# Patient Record
Sex: Female | Born: 1955
Health system: Southern US, Community
[De-identification: ages and names within clinical notes are randomized; demographics above are authoritative.]

## PROBLEM LIST (undated history)

## (undated) DIAGNOSIS — J40 Bronchitis, not specified as acute or chronic: Secondary | ICD-10-CM

## (undated) DIAGNOSIS — Z87442 Personal history of urinary calculi: Secondary | ICD-10-CM

## (undated) DIAGNOSIS — I10 Essential (primary) hypertension: Secondary | ICD-10-CM

## (undated) DIAGNOSIS — K219 Gastro-esophageal reflux disease without esophagitis: Secondary | ICD-10-CM

## (undated) DIAGNOSIS — S060X9A Concussion with loss of consciousness of unspecified duration, initial encounter: Secondary | ICD-10-CM

## (undated) DIAGNOSIS — E039 Hypothyroidism, unspecified: Secondary | ICD-10-CM

## (undated) DIAGNOSIS — M1990 Unspecified osteoarthritis, unspecified site: Secondary | ICD-10-CM

## (undated) DIAGNOSIS — S060XAA Concussion with loss of consciousness status unknown, initial encounter: Secondary | ICD-10-CM

## (undated) HISTORY — PX: LITHOTRIPSY: SUR834

## (undated) HISTORY — DX: Concussion with loss of consciousness status unknown, initial encounter: S06.0XAA

## (undated) HISTORY — PX: COLONOSCOPY: SHX174

## (undated) HISTORY — PX: FOOT SURGERY: SHX648

## (undated) HISTORY — DX: Concussion with loss of consciousness of unspecified duration, initial encounter: S06.0X9A

---

## 1996-05-08 HISTORY — PX: DILATION AND CURETTAGE OF UTERUS: SHX78

## 2000-12-12 ENCOUNTER — Other Ambulatory Visit: Admission: RE | Admit: 2000-12-12 | Discharge: 2000-12-12 | Payer: Self-pay | Admitting: Family Medicine

## 2004-08-01 ENCOUNTER — Ambulatory Visit: Payer: Self-pay | Admitting: Family Medicine

## 2005-10-02 ENCOUNTER — Ambulatory Visit: Payer: Self-pay

## 2006-10-29 ENCOUNTER — Ambulatory Visit: Payer: Self-pay

## 2007-10-31 ENCOUNTER — Ambulatory Visit: Payer: Self-pay | Admitting: Family Medicine

## 2007-11-07 ENCOUNTER — Ambulatory Visit: Payer: Self-pay | Admitting: Family Medicine

## 2008-05-20 ENCOUNTER — Ambulatory Visit: Payer: Self-pay | Admitting: Family Medicine

## 2008-09-16 ENCOUNTER — Ambulatory Visit: Payer: Self-pay

## 2008-09-27 ENCOUNTER — Emergency Department: Payer: Self-pay | Admitting: Emergency Medicine

## 2008-10-01 ENCOUNTER — Ambulatory Visit: Payer: Self-pay | Admitting: Urology

## 2008-10-12 ENCOUNTER — Ambulatory Visit: Payer: Self-pay | Admitting: Urology

## 2008-10-26 ENCOUNTER — Ambulatory Visit: Payer: Self-pay | Admitting: Urology

## 2008-10-27 ENCOUNTER — Ambulatory Visit: Payer: Self-pay | Admitting: Urology

## 2008-10-29 ENCOUNTER — Ambulatory Visit: Payer: Self-pay | Admitting: Urology

## 2008-11-16 ENCOUNTER — Ambulatory Visit: Payer: Self-pay | Admitting: Urology

## 2009-02-11 ENCOUNTER — Ambulatory Visit: Payer: Self-pay

## 2010-03-04 ENCOUNTER — Ambulatory Visit: Payer: Self-pay | Admitting: Family Medicine

## 2011-04-06 ENCOUNTER — Ambulatory Visit: Payer: Self-pay | Admitting: Family Medicine

## 2011-10-25 ENCOUNTER — Ambulatory Visit: Payer: Self-pay | Admitting: Family Medicine

## 2012-04-01 LAB — HM HEPATITIS C SCREENING LAB: HM HEPATITIS C SCREENING: NEGATIVE

## 2012-04-19 ENCOUNTER — Ambulatory Visit: Payer: Self-pay | Admitting: Family Medicine

## 2013-05-20 ENCOUNTER — Ambulatory Visit: Payer: Self-pay | Admitting: Family Medicine

## 2014-06-04 ENCOUNTER — Ambulatory Visit: Payer: Self-pay | Admitting: Family Medicine

## 2014-07-13 ENCOUNTER — Ambulatory Visit: Payer: Self-pay | Admitting: Family Medicine

## 2014-10-09 ENCOUNTER — Telehealth: Payer: Self-pay | Admitting: Family Medicine

## 2014-10-09 NOTE — Telephone Encounter (Signed)
Note written and placed at the front desk file for pick up. Please notify patient.

## 2014-10-09 NOTE — Telephone Encounter (Signed)
PT would like to pick up a letter for work stating she can wear tennis shoes to work. Pt stated that she has to get this letter every year. Thanks TNP

## 2014-10-12 NOTE — Telephone Encounter (Signed)
Patient advised work note is at front desk ready for pick up. NW

## 2014-11-03 ENCOUNTER — Other Ambulatory Visit: Payer: Self-pay | Admitting: Family Medicine

## 2014-11-14 ENCOUNTER — Other Ambulatory Visit: Payer: Self-pay | Admitting: Family Medicine

## 2014-11-22 ENCOUNTER — Other Ambulatory Visit: Payer: Self-pay | Admitting: Family Medicine

## 2014-11-23 ENCOUNTER — Other Ambulatory Visit: Payer: Self-pay | Admitting: Family Medicine

## 2014-12-01 ENCOUNTER — Encounter: Payer: Self-pay | Admitting: Family Medicine

## 2014-12-01 ENCOUNTER — Ambulatory Visit (INDEPENDENT_AMBULATORY_CARE_PROVIDER_SITE_OTHER): Payer: 59 | Admitting: Family Medicine

## 2014-12-01 VITALS — BP 116/78 | HR 68 | Temp 97.8°F | Resp 16 | Wt 153.4 lb

## 2014-12-01 DIAGNOSIS — Z975 Presence of (intrauterine) contraceptive device: Secondary | ICD-10-CM | POA: Insufficient documentation

## 2014-12-01 DIAGNOSIS — G43909 Migraine, unspecified, not intractable, without status migrainosus: Secondary | ICD-10-CM | POA: Insufficient documentation

## 2014-12-01 DIAGNOSIS — K5730 Diverticulosis of large intestine without perforation or abscess without bleeding: Secondary | ICD-10-CM | POA: Insufficient documentation

## 2014-12-01 DIAGNOSIS — R768 Other specified abnormal immunological findings in serum: Secondary | ICD-10-CM | POA: Insufficient documentation

## 2014-12-01 DIAGNOSIS — I10 Essential (primary) hypertension: Secondary | ICD-10-CM | POA: Insufficient documentation

## 2014-12-01 DIAGNOSIS — K589 Irritable bowel syndrome without diarrhea: Secondary | ICD-10-CM | POA: Insufficient documentation

## 2014-12-01 DIAGNOSIS — E559 Vitamin D deficiency, unspecified: Secondary | ICD-10-CM | POA: Insufficient documentation

## 2014-12-01 DIAGNOSIS — F411 Generalized anxiety disorder: Secondary | ICD-10-CM | POA: Insufficient documentation

## 2014-12-01 DIAGNOSIS — Z124 Encounter for screening for malignant neoplasm of cervix: Secondary | ICD-10-CM | POA: Diagnosis not present

## 2014-12-01 DIAGNOSIS — L409 Psoriasis, unspecified: Secondary | ICD-10-CM

## 2014-12-01 DIAGNOSIS — K219 Gastro-esophageal reflux disease without esophagitis: Secondary | ICD-10-CM | POA: Insufficient documentation

## 2014-12-01 DIAGNOSIS — F419 Anxiety disorder, unspecified: Secondary | ICD-10-CM | POA: Insufficient documentation

## 2014-12-01 DIAGNOSIS — N841 Polyp of cervix uteri: Secondary | ICD-10-CM | POA: Insufficient documentation

## 2014-12-01 NOTE — Progress Notes (Signed)
   Subjective:    Patient ID: Gina [name] Labella, female    DOB: 1955/08/19, 59 y.o.   MRN: [id number] Chief Complaint  Patient presents with  . Gynecologic Exam    HPI  This 59 year old female had normal exam 04-20-14 but PAP by thin prep reported lubricant obscuring specimen and unable to complete test. This is a repeat test only today. Asymptomatic with out significant concerns. Psoriasis showing a slight flare today on back and buttocks only.  History reviewed. No pertinent past medical history. Patient Active Problem List   Diagnosis Date Noted  . Anxiety 12/01/2014  . Mucous polyp of cervix 12/01/2014  . Diverticulosis of colon 12/01/2014  . Acid reflux 12/01/2014  . HCV antibody positive 12/01/2014  . BP (high blood pressure) 12/01/2014  . Irritable colon 12/01/2014  . Presence of intrauterine contraceptive device 12/01/2014  . Migraine 12/01/2014  . Psoriasis 12/01/2014  . Avitaminosis D 12/01/2014   Past Surgical History  Procedure Laterality Date  . Foot surgery      08/2007  . Dilation and curettage of uterus  1998   History  Substance Use Topics  . Smoking status: Never Smoker   . Smokeless tobacco: Never Used  . Alcohol Use: No   Family History  Problem Relation Age of Onset  . COPD Mother   . Emphysema Mother   . Arthritis Mother   . Hypertension Father   . Heart disease Father   . Coronary artery disease Father   . Arthritis Sister   . Throat cancer Brother   . Heart attack Maternal Grandfather   . Alzheimer's disease Paternal Grandfather       Review of Systems  Genitourinary: Negative.   Skin: Positive for rash.      Objective:   Physical Exam  Constitutional: She is oriented to person, place, and time. She appears well-developed and well-nourished. No distress.  HENT:  Head: Normocephalic and atraumatic.  Right Ear: Hearing normal.  Left Ear: Hearing normal.  Nose: Nose normal.  Eyes: Conjunctivae and lids are normal. Right eye  exhibits no discharge. Left eye exhibits no discharge. No scleral icterus.  Pulmonary/Chest: Effort normal. No respiratory distress.  Genitourinary: Vagina normal. There is no rash or tenderness on the right labia. There is no rash or tenderness on the left labia. Cervix exhibits no discharge. Right adnexum displays no mass, no tenderness and no fullness. Left adnexum displays no mass, no tenderness and no fullness.    Musculoskeletal: Normal range of motion.  Neurological: She is alert and oriented to person, place, and time.  Skin: Skin is intact. Rash noted. No lesion noted.     Psychiatric: She has a normal mood and affect. Her speech is normal and behavior is normal. Thought content normal.      Assessment & Plan:  1. Pap smear for cervical cancer screening Denies discharge, irritation or pain vaginally. No abdominal or pelvic pain. LMP 2001. PAP smear 04-20-14 was obscured by lubricant and unable to be reported. This is a repeat test only. 2. Psoriasis Large patches with thick silvery scale on mid back and upper buttock cleft. Usually flares in the winter and controlled with Triamcinolone cream. Recheck at [facility] in December 2016.

## 2014-12-03 ENCOUNTER — Telehealth: Payer: Self-pay

## 2014-12-03 LAB — PAP IG W/ RFLX HPV ASCU: PAP SMEAR COMMENT: 0

## 2014-12-03 NOTE — Telephone Encounter (Signed)
LMTCB 12/03/2014  Thanks,   -Mickel Baas

## 2014-12-03 NOTE — Telephone Encounter (Signed)
-----   Message from Margo Common, [place] sent at 12/03/2014  1:57 PM EDT ----- Normal PAP smear.

## 2014-12-04 NOTE — Telephone Encounter (Signed)
Pt advised.

## 2014-12-11 ENCOUNTER — Other Ambulatory Visit: Payer: Self-pay | Admitting: Family Medicine

## 2014-12-11 DIAGNOSIS — F419 Anxiety disorder, unspecified: Secondary | ICD-10-CM

## 2014-12-11 DIAGNOSIS — F5105 Insomnia due to other mental disorder: Principal | ICD-10-CM

## 2014-12-15 ENCOUNTER — Other Ambulatory Visit: Payer: Self-pay | Admitting: Family Medicine

## 2014-12-15 NOTE — Telephone Encounter (Signed)
ALPRAZolam (XANAX) 0.5 MG tablet 12/11/14 -- Vickki Muff Chrismon, PA TAKE 1 TABLET BY MOUTH DURING DAY AND 1 TAB AT BEDTIME Notes: Not to exceed 5 additional fills before 05/04/2015.  [facility] uses [facility].  Thanks Con Memos

## 2014-12-16 NOTE — Telephone Encounter (Signed)
Pt still waiting on her generic xanax.    Thanks [employer]

## 2014-12-17 NOTE — Telephone Encounter (Signed)
Left message to call back  

## 2014-12-18 NOTE — Telephone Encounter (Signed)
LMTCB-12/18/14  Thanks,  -Joseline

## 2014-12-18 NOTE — Telephone Encounter (Signed)
Advised pt as directed below, pt stated that her prescription was called to the pharmacy and that she has already picked it up from the pharmacy.  Thanks,

## 2015-02-18 ENCOUNTER — Telehealth: Payer: Self-pay | Admitting: Family Medicine

## 2015-02-18 ENCOUNTER — Other Ambulatory Visit: Payer: Self-pay | Admitting: Family Medicine

## 2015-02-18 DIAGNOSIS — F419 Anxiety disorder, unspecified: Secondary | ICD-10-CM

## 2015-02-18 NOTE — Telephone Encounter (Signed)
Pt contacted office for refill request on the following medications:  ALPRAZolam (XANAX) 0.5 MG.  [facility].  CB#[phone]/MW

## 2015-02-22 ENCOUNTER — Other Ambulatory Visit: Payer: Self-pay

## 2015-02-22 NOTE — Telephone Encounter (Signed)
[facility] Alp[facility]am 0.5 mg tablets.

## 2015-02-23 ENCOUNTER — Other Ambulatory Visit: Payer: Self-pay | Admitting: Family Medicine

## 2015-02-23 NOTE — Telephone Encounter (Signed)
Pt contacted office for refill request on the following medications: ALPRAZolam (XANAX) 0.5 MG tablet to [facility]. Pt would like this sent today if possible b/c she has been trying to get this filled since 02/18/15. Thanks TNP

## 2015-02-23 NOTE — Telephone Encounter (Signed)
Completed form today and will [facility] [place].

## 2015-02-24 NOTE — Telephone Encounter (Signed)
Patient advised [facility] has [facility]

## 2015-05-06 ENCOUNTER — Other Ambulatory Visit: Payer: Self-pay | Admitting: Family Medicine

## 2015-05-07 ENCOUNTER — Telehealth: Payer: Self-pay | Admitting: Family Medicine

## 2015-05-07 NOTE — Telephone Encounter (Signed)
[facility]  

## 2015-05-07 NOTE — Telephone Encounter (Signed)
Gina [name] patient 

## 2015-05-13 ENCOUNTER — Encounter: Payer: Self-pay | Admitting: Physician Assistant

## 2015-05-13 ENCOUNTER — Ambulatory Visit (INDEPENDENT_AMBULATORY_CARE_PROVIDER_SITE_OTHER): Payer: 59 | Admitting: Physician Assistant

## 2015-05-13 VITALS — BP 110/70 | HR 79 | Temp 97.8°F | Resp 16 | Ht 63.0 in | Wt 156.6 lb

## 2015-05-13 DIAGNOSIS — B354 Tinea corporis: Secondary | ICD-10-CM

## 2015-05-13 DIAGNOSIS — J302 Other seasonal allergic rhinitis: Secondary | ICD-10-CM

## 2015-05-13 DIAGNOSIS — Z0000 Encounter for general adult medical examination without abnormal findings: Secondary | ICD-10-CM | POA: Diagnosis not present

## 2015-05-13 DIAGNOSIS — Z1239 Encounter for other screening for malignant neoplasm of breast: Secondary | ICD-10-CM

## 2015-05-13 DIAGNOSIS — J0140 Acute pansinusitis, unspecified: Secondary | ICD-10-CM

## 2015-05-13 DIAGNOSIS — B49 Unspecified mycosis: Secondary | ICD-10-CM

## 2015-05-13 LAB — POCT URINALYSIS DIPSTICK
BILIRUBIN UA: NEGATIVE
Blood, UA: NEGATIVE
Glucose, UA: NEGATIVE
Ketones, UA: NEGATIVE
LEUKOCYTES UA: NEGATIVE
Nitrite, UA: NEGATIVE
Protein, UA: NEGATIVE
Spec Grav, UA: 1.01
Urobilinogen, UA: 1
pH, UA: 6

## 2015-05-13 MED ORDER — AZITHROMYCIN 250 MG PO TABS: ORAL_TABLET | ORAL | Status: DC

## 2015-05-13 MED ORDER — FLUTICASONE PROPIONATE 50 MCG/ACT NA SUSP: 2.0000 | Freq: Every day | NASAL | Status: DC

## 2015-05-13 MED ORDER — CLOTRIMAZOLE-BETAMETHASONE 1-0.05 % EX CREA: 1.0000 "application " | TOPICAL_CREAM | Freq: Two times a day (BID) | CUTANEOUS | Status: DC

## 2015-05-13 NOTE — Patient Instructions (Signed)

## 2015-05-13 NOTE — Progress Notes (Signed)
Patient: Gina [name], Female    DOB: 1955/07/13, 60 y.o.   MRN: [id number] Visit Date: 05/13/2015  Today's Provider: Mar Daring, PA-C   Chief Complaint  Patient presents with  . Annual Exam   Subjective:    Annual physical exam Gina [name] is a 60 y.o. female who presents today for health maintenance and complete physical. She feels well, patient reports her lower back hurts for the past days, but she has been very active at work. Patient doesn't have any hematuria or dysuria. She reports exercising--once a week walks. She reports she is sleeping well.  Tdap:01/20/2009 Mammo:06/04/14 NL Colonoscopy:05/10/2013 Diverticulosis Internal Hemorrhoids-repeat in 10 years Pap:12/01/14 NL BMD:07/13/14 EKG:03/26/2012  -----------------------------------------------------------------   Review of Systems  Constitutional: Negative.   HENT: Positive for ear pain and sinus pressure.   Eyes: Negative.   Respiratory: Negative.   Cardiovascular: Negative.   Gastrointestinal: Negative.   Endocrine: Negative.   Genitourinary: Negative.   Musculoskeletal: Positive for back pain.  Skin: Positive for rash.  Allergic/Immunologic: Negative.   Neurological: Negative.   Hematological: Negative.   Psychiatric/Behavioral: Negative.     Social History      She  reports that she has never smoked. She has never used smokeless tobacco. She reports that she does not drink alcohol or use illicit drugs.       Social History   Social History  . Marital Status: Married    Spouse Name: N/A  . Number of Children: N/A  . Years of Education: N/A   Social History Main Topics  . Smoking status: Never Smoker   . Smokeless tobacco: Never Used  . Alcohol Use: No  . Drug Use: No  . Sexual Activity: Not Asked   Other Topics Concern  . None   Social History Narrative    History reviewed. No pertinent past medical history.   Patient Active Problem List   Diagnosis Date  Noted  . Anxiety 12/01/2014  . Mucous polyp of cervix 12/01/2014  . Diverticulosis of colon 12/01/2014  . Acid reflux 12/01/2014  . HCV antibody positive 12/01/2014  . BP (high blood pressure) 12/01/2014  . Irritable colon 12/01/2014  . Presence of intrauterine contraceptive device 12/01/2014  . Migraine 12/01/2014  . Psoriasis 12/01/2014  . Avitaminosis D 12/01/2014    Past Surgical History  Procedure Laterality Date  . Foot surgery      08/2007  . Dilation and curettage of uterus  1998    Family History        Family Status  Relation Status Death Age  . Mother Deceased   . Father Deceased   . Sister Alive   . Brother Deceased   . Maternal Grandmother Deceased   . Maternal Grandfather Deceased   . Paternal Grandmother Deceased   . Paternal Grandfather Deceased         Her family history includes Alzheimer's disease in her paternal grandfather; Arthritis in her mother and sister; COPD in her mother; Coronary artery disease in her father; Emphysema in her mother; Heart attack in her maternal grandfather; Heart disease in her father; Hypertension in her father; Throat cancer in her brother.    No Known Allergies  Previous Medications   ALPRAZOLAM (XANAX) 0.5 MG TABLET    TAKE 1 TABLET BY MOUTH DURING DAY AND 1 TABLET AT BEDTIME   AMLODIPINE (NORVASC) 5 MG TABLET    TAKE 1 TABLET BY MOUTH EVERY DAY   B COMPLEX VITAMINS  PO    Take 1 tablet by mouth daily.   CETIRIZINE (ZYRTEC ALLERGY) 10 MG TABLET    Take 1 tablet by mouth daily.   CHOLECALCIFEROL (VITAMIN D3) 1000 UNITS CAPS    Take 1 capsule by mouth daily.   CLOTRIMAZOLE-BETAMETHASONE (LOTRISONE) CREAM    1 application 2 (two) times daily.   FLUTICASONE (FLONASE) 50 MCG/ACT NASAL SPRAY    Place 2 sprays into the nose daily.   LANSOPRAZOLE (PREVACID) 15 MG CAPSULE    Take 1 capsule by mouth daily.   MONTELUKAST (SINGULAIR) 10 MG TABLET    TAKE 1 TABLET BY MOUTH EVERY DAY   POLYCARBOPHIL (FIBERCON) 625 MG TABLET    Take 1  tablet by mouth daily.    Patient Care Team: Margo Common, PA as PCP - General (Physician Assistant)     Objective:   Vitals: BP 110/70 mmHg  Pulse 79  Temp(Src) 97.8 F (36.6 C) (Oral)  Resp 16  Ht 5\' 3"  (1.6 m)  Wt 156 lb 9.6 oz (71.033 kg)  BMI 27.75 kg/m2   Physical Exam  Constitutional: She is oriented to person, place, and time. She appears well-developed and well-nourished. No distress.  HENT:  Head: Normocephalic and atraumatic.  Right Ear: External ear normal.  Left Ear: External ear normal.  Nose: Nose normal.  Mouth/Throat: Oropharynx is clear and moist. No oropharyngeal exudate.  Eyes: Conjunctivae and EOM are normal. Pupils are equal, round, and reactive to light. Right eye exhibits no discharge. Left eye exhibits no discharge. No scleral icterus.  Neck: Normal range of motion. Neck supple. No JVD present. No tracheal deviation present. No thyromegaly present.  Cardiovascular: Normal rate, regular rhythm, normal heart sounds and intact distal pulses.  Exam reveals no gallop and no friction rub.   No murmur heard. Pulmonary/Chest: Effort normal and breath sounds normal. No respiratory distress. She has no wheezes. She has no rales. She exhibits no tenderness. Right breast exhibits no inverted nipple, no mass, no nipple discharge, no skin change and no tenderness. Left breast exhibits no inverted nipple, no mass, no nipple discharge, no skin change and no tenderness. Breasts are symmetrical.  Abdominal: Soft. Bowel sounds are normal. She exhibits no distension and no mass. There is no tenderness. There is no rebound and no guarding.  Musculoskeletal: Normal range of motion. She exhibits no edema or tenderness.  Lymphadenopathy:    She has no cervical adenopathy.  Neurological: She is alert and oriented to person, place, and time.  Skin: Skin is warm and dry. No rash noted. She is not diaphoretic.  Psychiatric: She has a normal mood and affect. Her behavior is  normal. Judgment and thought content normal.  Vitals reviewed.    Depression Screen No flowsheet data found.    Assessment & Plan:     Routine Health Maintenance and Physical Exam  1. Annual physical exam Physical exam today was normal. I will check labs as below. I will follow-up with her pending lab results. UA today in the office was negative for infection. If labs are stable and within normal limits she would not need to have these repeated for one year and can be done at her next annual physical exam. She is to call the office in the meantime if she has any acute issues, questions or concerns. - POCT urinalysis dipstick - CBC with Differential/Platelet - Comprehensive metabolic panel - Lipid panel - TSH - Hemoglobin A1c  2. Breast cancer screening Breast exam today in the  office was normal. Mammogram was ordered as below. Information for [facility] breast clinic was given to patient so that she may call and schedule this appointment at her convenience. - MM DIGITAL SCREENING BILATERAL; Future  3. Other seasonal allergic rhinitis Currently stable with Flonase. Continue current medical treatment plan. Flonase was pulled as below for refill. - fluticasone (FLONASE) 50 MCG/ACT nasal spray; Place 2 sprays into both nostrils daily.  Dispense: 16 g; Refill: 11  4. Fungal infection of the body She does have a fungal infections and that is recurrent under her breast tissue. She feels this is most likely secondary to her still having night sweats. She has been using Lotrisone cream which does help to keep the infection under control. Medication was pulled for refill. - clotrimazole-betamethasone (LOTRISONE) cream; Apply 1 application topically 2 (two) times daily.  Dispense: 45 g; Refill: 1  5. Acute pansinusitis, recurrence not specified She has had consistent sinus infection over the last 3 weeks without resolution with over-the-counter treatments. I will give her a Z-Pak as below.  She is to call the office if symptoms fail to improve or worsen. - azithromycin (ZITHROMAX) 250 MG tablet; Take 2 tablets PO on day one, and one tablet PO daily thereafter until completed.  Dispense: 6 tablet; Refill: 0   Exercise Activities and Dietary recommendations Goals    None      Immunization History  Administered Date(s) Administered  . Tdap 01/20/2009    Health Maintenance  Topic Date Due  . Hepatitis C Screening  1956-01-04  . HIV Screening  10/01/1972  . MAMMOGRAM  10/02/2007  . COLONOSCOPY  10/02/2007  . INFLUENZA VACCINE  12/07/2014  . PAP SMEAR  11/30/2017  . TETANUS/TDAP  01/21/2019      Discussed health benefits of physical activity, and encouraged her to engage in regular exercise appropriate for her age and condition.    --------------------------------------------------------------------

## 2015-05-18 ENCOUNTER — Other Ambulatory Visit: Payer: Self-pay | Admitting: Family Medicine

## 2015-05-21 NOTE — Telephone Encounter (Signed)
Pt called back to let us know [facility] for amLODipine (NORVASC) 5 MG tablet. Pt stated that she thought our office had told her we sent the RX but maybe she miss understood. Pt stated she is about out of the medication and would like it to be sent today if possible. Thanks TNP

## 2015-05-21 NOTE — Telephone Encounter (Signed)
Pt is calling for refill amLODipine (NORVASC) 5 MG tablet Taking 11/16/14 -- Vickki Muff Chrismon, PA TAKE 1 TABLET BY MOUTH EVERY DAY   [facility] [place], [employer]

## 2015-05-22 LAB — CBC WITH DIFFERENTIAL/PLATELET
BASOS: 1 %
Basophils Absolute: 0.1 10*3/uL (ref 0.0–0.2)
EOS (ABSOLUTE): 0.2 10*3/uL (ref 0.0–0.4)
Eos: 6 %
Hematocrit: 40.2 % (ref 34.0–46.6)
Hemoglobin: 13.1 g/dL (ref 11.1–15.9)
IMMATURE GRANS (ABS): 0 10*3/uL (ref 0.0–0.1)
Immature Granulocytes: 0 %
LYMPHS: 29 %
Lymphocytes Absolute: 1.2 10*3/uL (ref 0.7–3.1)
MCH: 31.1 pg (ref 26.6–33.0)
MCHC: 32.6 g/dL (ref 31.5–35.7)
MCV: 96 fL (ref 79–97)
Monocytes Absolute: 0.4 10*3/uL (ref 0.1–0.9)
Monocytes: 8 %
Neutrophils Absolute: 2.4 10*3/uL (ref 1.4–7.0)
Neutrophils: 56 %
Platelets: 267 10*3/uL (ref 150–379)
RBC: 4.21 x10E6/uL (ref 3.77–5.28)
RDW: 14.2 % (ref 12.3–15.4)
WBC: 4.3 10*3/uL (ref 3.4–10.8)

## 2015-05-22 LAB — COMPREHENSIVE METABOLIC PANEL
ALT: 15 IU/L (ref 0–32)
AST: 17 IU/L (ref 0–40)
Albumin/Globulin Ratio: 1.4 (ref 1.1–2.5)
Albumin: 4 g/dL (ref 3.5–5.5)
Alkaline Phosphatase: 73 IU/L (ref 39–117)
BILIRUBIN TOTAL: 0.4 mg/dL (ref 0.0–1.2)
BUN/Creatinine Ratio: 16 (ref 9–23)
BUN: 11 mg/dL (ref 6–24)
CHLORIDE: 106 mmol/L (ref 96–106)
CO2: 26 mmol/L (ref 18–29)
Calcium: 9.4 mg/dL (ref 8.7–10.2)
Creatinine, Ser: 0.69 mg/dL (ref 0.57–1.00)
GFR calc Af Amer: 112 mL/min/{1.73_m2} (ref >59)
GFR calc non Af Amer: 97 mL/min/{1.73_m2} (ref >59)
Globulin, Total: 2.8 g/dL (ref 1.5–4.5)
Glucose: 83 mg/dL (ref 65–99)
Potassium: 4.4 mmol/L (ref 3.5–5.2)
Sodium: 145 mmol/L — ABNORMAL HIGH (ref 134–144)
TOTAL PROTEIN: 6.8 g/dL (ref 6.0–8.5)

## 2015-05-22 LAB — LIPID PANEL
Chol/HDL Ratio: 3 ratio units (ref 0.0–4.4)
Cholesterol, Total: 187 mg/dL (ref 100–199)
HDL: 63 mg/dL (ref >39)
LDL Calculated: 107 mg/dL — ABNORMAL HIGH (ref 0–99)
Triglycerides: 83 mg/dL (ref 0–149)
VLDL Cholesterol Cal: 17 mg/dL (ref 5–40)

## 2015-05-22 LAB — HEMOGLOBIN A1C
ESTIMATED AVERAGE GLUCOSE: 108 mg/dL
Hgb A1c MFr Bld: 5.4 % (ref 4.8–5.6)

## 2015-05-22 LAB — TSH: TSH: 3.49 u[IU]/mL (ref 0.450–4.500)

## 2015-05-24 ENCOUNTER — Telehealth: Payer: Self-pay

## 2015-05-24 NOTE — Telephone Encounter (Signed)
Patient advised as directed below. Patient verbalized understanding.  

## 2015-05-24 NOTE — Telephone Encounter (Signed)
-----   Message from Mar Daring, PA-C sent at 05/22/2015  3:39 PM EST ----- All labs are within normal limits and stable.  We will recheck in one year. Thanks! -JB

## 2015-06-10 ENCOUNTER — Other Ambulatory Visit: Payer: Self-pay | Admitting: Physician Assistant

## 2015-06-10 DIAGNOSIS — F411 Generalized anxiety disorder: Secondary | ICD-10-CM

## 2015-06-10 NOTE — Telephone Encounter (Signed)
[facility] [place] [facility] Alp[facility]am 0.5mg  as prescribed. Patient advised.  Thanks,  -Joseline

## 2015-06-24 ENCOUNTER — Ambulatory Visit
Admission: RE | Admit: 2015-06-24 | Discharge: 2015-06-24 | Disposition: A | Discharge status: Home / Self Care | Payer: 59 | Source: Ambulatory Visit | Attending: Physician Assistant | Admitting: Physician Assistant

## 2015-06-24 DIAGNOSIS — Z1231 Encounter for screening mammogram for malignant neoplasm of breast: Secondary | ICD-10-CM | POA: Insufficient documentation

## 2015-06-24 DIAGNOSIS — Z1239 Encounter for other screening for malignant neoplasm of breast: Secondary | ICD-10-CM

## 2015-06-25 ENCOUNTER — Telehealth: Payer: Self-pay

## 2015-06-25 NOTE — Telephone Encounter (Signed)
LMTCB  Thanks,  -Tiaja Hagan 

## 2015-06-25 NOTE — Telephone Encounter (Signed)
-----   Message from Mar Daring, PA-C sent at 06/25/2015  8:15 AM EST ----- Normal mammogram. Repeat screening in one year.

## 2015-06-28 ENCOUNTER — Encounter: Payer: Self-pay | Admitting: Physician Assistant

## 2015-06-28 ENCOUNTER — Other Ambulatory Visit: Payer: Self-pay | Admitting: Physician Assistant

## 2015-06-28 NOTE — Telephone Encounter (Signed)
Pt is returning call.  CB#[phone]/MW

## 2015-06-28 NOTE — Telephone Encounter (Signed)
Patient advised.  Thanks,  -Gina [name] 

## 2015-06-28 NOTE — Telephone Encounter (Signed)
Letter printed.

## 2015-06-28 NOTE — Telephone Encounter (Signed)
Patient advised as directed below. Patient is requesting a note for her to wear tennis shoes at work she said she usually gets this note just to have it on file at work.  Thanks,  Gina [name]

## 2015-07-21 ENCOUNTER — Other Ambulatory Visit: Payer: Self-pay | Admitting: Physician Assistant

## 2015-07-21 DIAGNOSIS — F411 Generalized anxiety disorder: Secondary | ICD-10-CM

## 2015-07-22 NOTE — Telephone Encounter (Signed)
[facility]  

## 2015-08-13 ENCOUNTER — Ambulatory Visit (INDEPENDENT_AMBULATORY_CARE_PROVIDER_SITE_OTHER): Payer: 59 | Admitting: Physician Assistant

## 2015-08-13 ENCOUNTER — Encounter: Payer: Self-pay | Admitting: Physician Assistant

## 2015-08-13 VITALS — BP 118/80 | HR 72 | Temp 98.0°F | Resp 16 | Wt 153.0 lb

## 2015-08-13 DIAGNOSIS — L409 Psoriasis, unspecified: Secondary | ICD-10-CM | POA: Diagnosis not present

## 2015-08-13 DIAGNOSIS — L0293 Carbuncle, unspecified: Secondary | ICD-10-CM

## 2015-08-13 MED ORDER — TRIAMCINOLONE ACETONIDE 0.1 % EX CREA: 1.0000 "application " | TOPICAL_CREAM | Freq: Two times a day (BID) | CUTANEOUS | Status: DC

## 2015-08-13 MED ORDER — DOXYCYCLINE HYCLATE 100 MG PO TABS: 100.0000 mg | ORAL_TABLET | Freq: Two times a day (BID) | ORAL | Status: DC

## 2015-08-13 NOTE — Progress Notes (Signed)
Patient: Jezabel Shaheed Paxton Female    DOB: January 29, 1956   60 y.o.   MRN: [id number] Visit Date: 08/13/2015  Today's Provider: Mar Daring, PA-C   Chief Complaint  Patient presents with  . Cyst    in vaginal area   Subjective:    HPI Tiffney Younker is here with c/o couple of bumps in her vaginal area. She started with abdominal pain and some diarrhea in the last week.She notice the bump the last week is just getting worse. Redness, swellling and pain is present. No drainage or fever. She has been feeling warmth over the bumps. Gets aggravated by her clothes. Has tried:Desitin and Vaseline. She did warm compress yesterday.     No Known Allergies Previous Medications   ALPRAZOLAM (XANAX) 0.5 MG TABLET    TAKE 1 TABLET BY MOUTH DURING DAY AND 1 TABLET AT BEDTIME   AMLODIPINE (NORVASC) 5 MG TABLET    TAKE 1 TABLET BY MOUTH EVERY DAY   AZITHROMYCIN (ZITHROMAX) 250 MG TABLET    Take 2 tablets PO on day one, and one tablet PO daily thereafter until completed.   B COMPLEX VITAMINS PO    Take 1 tablet by mouth daily.   CETIRIZINE (ZYRTEC ALLERGY) 10 MG TABLET    Take 1 tablet by mouth daily.   CHOLECALCIFEROL (VITAMIN D3) 1000 UNITS CAPS    Take 1 capsule by mouth daily.   CLOTRIMAZOLE-BETAMETHASONE (LOTRISONE) CREAM    Apply 1 application topically 2 (two) times daily.   FLUTICASONE (FLONASE) 50 MCG/ACT NASAL SPRAY    Place 2 sprays into both nostrils daily.   LANSOPRAZOLE (PREVACID) 15 MG CAPSULE    Take 1 capsule by mouth daily.    MONTELUKAST (SINGULAIR) 10 MG TABLET    TAKE 1 TABLET BY MOUTH EVERY DAY   POLYCARBOPHIL (FIBERCON) 625 MG TABLET    Take 1 tablet by mouth daily. Reported on 08/13/2015    Review of Systems  Constitutional: Positive for chills. Negative for fever.  Respiratory: Negative.   Cardiovascular: Negative for chest pain, palpitations and leg swelling.  Gastrointestinal: Negative.   Genitourinary:       Bumps in private area  Skin: Positive for rash.     Social History  Substance Use Topics  . Smoking status: Never Smoker   . Smokeless tobacco: Never Used  . Alcohol Use: No   Objective:   BP 118/80 mmHg  Pulse 72  Temp(Src) 98 F (36.7 C) (Oral)  Resp 16  Wt 153 lb (69.4 kg)  Physical Exam  Constitutional: She appears well-developed and well-nourished. No distress.  Cardiovascular: Normal rate, regular rhythm and normal heart sounds.  Exam reveals no gallop and no friction rub.   No murmur heard. Pulmonary/Chest: Effort normal and breath sounds normal. No respiratory distress. She has no wheezes. She has no rales.  Skin: She is not diaphoretic.     Vitals reviewed.       Assessment & Plan:     1. Psoriasis Will give trimacinolone cream for psoriasis flare. May also use desitin as well. Advised to wear loose fitting, cotton underwear.   - triamcinolone cream (KENALOG) 0.1 %; Apply 1 application topically 2 (two) times daily.  Dispense: 80 g; Refill: 0  2. Carbuncle Doxycycline given as below for carbuncles. No I&D done today because they were already auto-draining. Continue warm compresses. May do epsom salt soaks as well. Call if symptoms fail to improve or worsen. - doxycycline (VIBRA-TABS) 100  MG tablet; Take 1 tablet (100 mg total) by mouth 2 (two) times daily.  Dispense: 14 tablet; Refill: 0       Mar Daring, PA-C  [facility]

## 2015-08-13 NOTE — Patient Instructions (Signed)

## 2015-08-15 ENCOUNTER — Other Ambulatory Visit: Payer: Self-pay | Admitting: Family Medicine

## 2015-08-20 ENCOUNTER — Other Ambulatory Visit: Payer: Self-pay | Admitting: Physician Assistant

## 2015-08-20 DIAGNOSIS — F411 Generalized anxiety disorder: Secondary | ICD-10-CM

## 2015-08-20 NOTE — Telephone Encounter (Signed)
Prescription for Alprazolam 0.5 MG tablet [facility] [place]  Thanks,  -Joseline

## 2015-09-24 ENCOUNTER — Telehealth: Payer: Self-pay | Admitting: Physician Assistant

## 2015-09-24 DIAGNOSIS — L0293 Carbuncle, unspecified: Secondary | ICD-10-CM

## 2015-09-24 NOTE — Telephone Encounter (Signed)
She can do soaking in warm water with epsom salt, warm compress to see if that helps. If she is going to be out in the sun I do not want her to be on doxycycline due to sun sensitivity caused by medication.

## 2015-09-24 NOTE — Telephone Encounter (Signed)
If still needed yes she may. Tell her to call if needed then.

## 2015-09-24 NOTE — Telephone Encounter (Signed)
Pt advised-aa 

## 2015-09-24 NOTE — Telephone Encounter (Signed)
Pt called saying the boil that you lacerated in her vaginal area has came back and is really bother ing her.  [place] way to the beach and wants to know if there is something she can do.  Her call back is [phone]  Thanks, Con Memos

## 2015-09-24 NOTE — Telephone Encounter (Signed)
Pt advised and she will try the methods. She states they will be back Monday 5/22 and wants to know if she can have Doxy then, please review-aa

## 2015-09-24 NOTE — Telephone Encounter (Signed)
Please review-aa 

## 2015-09-27 MED ORDER — DOXYCYCLINE HYCLATE 100 MG PO TABS: 100.0000 mg | ORAL_TABLET | Freq: Two times a day (BID) | ORAL | Status: DC

## 2015-09-27 NOTE — Telephone Encounter (Signed)
Patient advised as directed below. Patient said is not tender. It only bothers her when she puts her jeans on.  Thanks,  -Gina [name]

## 2015-09-27 NOTE — Telephone Encounter (Signed)
Will send in antibiotic but she needs to come in if it is very tender to touch for I&D. Rx sent [facility].

## 2015-09-27 NOTE — Telephone Encounter (Signed)
Pt states she is not any better.  Pt is requesting a Rx to help with this if possible.  [facility].  CB#[phone]/MW

## 2015-09-27 NOTE — Telephone Encounter (Signed)
Please advise.  Thanks,  -Adalin Vanderploeg 

## 2015-11-11 ENCOUNTER — Telehealth: Payer: Self-pay | Admitting: Family Medicine

## 2015-11-11 ENCOUNTER — Other Ambulatory Visit: Payer: Self-pay | Admitting: Physician Assistant

## 2015-11-11 DIAGNOSIS — F419 Anxiety disorder, unspecified: Secondary | ICD-10-CM

## 2015-11-11 NOTE — Telephone Encounter (Signed)
Rx for Alprazolam called to [facility].  Thanks,  -Joseline

## 2015-11-11 NOTE — Telephone Encounter (Signed)
error 

## 2015-11-18 ENCOUNTER — Other Ambulatory Visit: Payer: Self-pay | Admitting: Family Medicine

## 2015-12-10 NOTE — Telephone Encounter (Signed)
error 

## 2016-01-25 ENCOUNTER — Encounter: Payer: Self-pay | Admitting: Family Medicine

## 2016-01-25 ENCOUNTER — Ambulatory Visit (INDEPENDENT_AMBULATORY_CARE_PROVIDER_SITE_OTHER): Payer: 59 | Admitting: Family Medicine

## 2016-01-25 ENCOUNTER — Other Ambulatory Visit: Payer: Self-pay | Admitting: Physician Assistant

## 2016-01-25 VITALS — BP 128/78 | HR 72 | Temp 97.7°F | Resp 14 | Wt 154.4 lb

## 2016-01-25 DIAGNOSIS — J069 Acute upper respiratory infection, unspecified: Secondary | ICD-10-CM

## 2016-01-25 DIAGNOSIS — F419 Anxiety disorder, unspecified: Secondary | ICD-10-CM

## 2016-01-25 MED ORDER — ALPRAZOLAM 0.5 MG PO TABS: ORAL_TABLET | ORAL | 1 refills | Status: DC

## 2016-01-25 NOTE — Progress Notes (Signed)
Patient: Gina [name] Female    DOB: 1955/06/15   60 y.o.   MRN: [id number] Visit Date: 01/25/2016  Today's Provider: Vernie Murders, PA   Chief Complaint  Patient presents with  . Sinusitis   Subjective:    Sinusitis  This is a new problem. The current episode started 1 to 4 weeks ago. The problem has been gradually improving since onset. There has been no fever. Associated symptoms include headaches and sinus pressure. (Lightheadedness ) Past treatments include antibiotics. The treatment provided mild relief.  Patient reports [facility]nd and prescribed antibiotics. Patient reports she completed the course of antibiotics, and symptoms improved for a couple days then returned.   Patient Active Problem List   Diagnosis Date Noted  . Anxiety 12/01/2014  . Mucous polyp of cervix 12/01/2014  . Diverticulosis of colon 12/01/2014  . Acid reflux 12/01/2014  . HCV antibody positive 12/01/2014  . BP (high blood pressure) 12/01/2014  . Irritable colon 12/01/2014  . Presence of intrauterine contraceptive device 12/01/2014  . Migraine 12/01/2014  . Psoriasis 12/01/2014  . Avitaminosis D 12/01/2014   Past Surgical History:  Procedure Laterality Date  . DILATION AND CURETTAGE OF UTERUS  1998  . FOOT SURGERY     08/2007   Family History  Problem Relation Age of Onset  . COPD Mother   . Emphysema Mother   . Arthritis Mother   . Hypertension Father   . Heart disease Father   . Coronary artery disease Father   . Arthritis Sister   . Throat cancer Brother   . Heart attack Maternal Grandfather   . Alzheimer's disease Paternal Grandfather   . Breast cancer Paternal Aunt    No Known Allergies   Previous Medications   ALPRAZOLAM (XANAX) 0.5 MG TABLET    TAKE 1 TABLET BY MOUTH DURING DAY AND 1 TABLET AT BEDTIME   AMLODIPINE (NORVASC) 5 MG TABLET    TAKE 1 TABLET BY MOUTH EVERY DAY   B COMPLEX VITAMINS PO    Take 1 tablet by mouth  daily.   CETIRIZINE (ZYRTEC ALLERGY) 10 MG TABLET    Take 1 tablet by mouth daily.   CHOLECALCIFEROL (VITAMIN D3) 1000 UNITS CAPS    Take 1 capsule by mouth daily.   CLOTRIMAZOLE-BETAMETHASONE (LOTRISONE) CREAM    Apply 1 application topically 2 (two) times daily.   FLUTICASONE (FLONASE) 50 MCG/ACT NASAL SPRAY    Place 2 sprays into both nostrils daily.   LANSOPRAZOLE (PREVACID) 15 MG CAPSULE    Take 1 capsule by mouth daily.    MONTELUKAST (SINGULAIR) 10 MG TABLET    TAKE 1 TABLET BY MOUTH EVERY DAY   POLYCARBOPHIL (FIBERCON) 625 MG TABLET    Take 1 tablet by mouth daily. Reported on 08/13/2015   TRIAMCINOLONE CREAM (KENALOG) 0.1 %    Apply 1 application topically 2 (two) times daily.    Review of Systems  Constitutional:       Sweats   HENT: Positive for sinus pressure.   Eyes: Positive for pain.  Respiratory: Negative.   Cardiovascular: Negative.   Neurological: Positive for light-headedness and headaches.    Social History  Substance Use Topics  . Smoking status: Never Smoker  . Smokeless tobacco: Never Used  . Alcohol use No   Objective:   BP 128/78 (BP Location: Right Arm, Patient Position: Sitting, Cuff Size: Normal)   Pulse 72   Temp 97.7 F (36.5 C) (  Oral)   Resp 14   Wt 154 lb 6.4 oz (70 kg)   SpO2 99%   BMI 27.35 kg/m   Physical Exam  Constitutional: She is oriented to person, place, and time. She appears well-developed and well-nourished. No distress.  HENT:  Head: Normocephalic and atraumatic.  Right Ear: Hearing and external ear normal.  Left Ear: Hearing and external ear normal.  Nose: Nose normal.  Mouth/Throat: Oropharynx is clear and moist.  Eyes: Conjunctivae and lids are normal. Right eye exhibits no discharge. Left eye exhibits no discharge. No scleral icterus.  Neck: Neck supple.  Cardiovascular: Normal rate and regular rhythm.   Pulmonary/Chest: Effort normal and breath sounds normal. No respiratory distress.  Abdominal: Soft. Bowel sounds are  normal.  Musculoskeletal: Normal range of motion.  Neurological: She is alert and oriented to person, place, and time.  Skin: Skin is intact. No lesion and no rash noted.  Psychiatric: She has a normal mood and affect. Her speech is normal and behavior is normal. Thought content normal.  Stress at work causing some anxiety and sleep disturbance. With use of Xanax, sleeps 8-10 hours a night.      Assessment & Plan:     1. Upper respiratory infection Onset of headache, sinus pressure and teeth aching at onset on 01-08-16. Was evaluated at the [facility] and treated with antibiotic. Still has some congested or occasional lightheaded sensation. Recommend she get back on the Zyrtec and Flonase. Will check labs for signs of persistent infection. May need another round of antibiotic and/or ENT referral. - CBC with Differential/Platelet  2. Anxiety Still having anxiety with stress from work. Increase in work load and responsibilities. Uses Xanax mostly at night to help sleep. Will refill the Xanax and check labs. Schedule CPE in 4 months. - ALPRAZolam (XANAX) 0.5 MG tablet; TAKE 1 TABLET BY MOUTH DURING DAY AND 1 TABLET AT BEDTIME  Dispense: 60 tablet; Refill: 1 - CBC with Differential/Platelet - Comprehensive metabolic panel - TSH

## 2016-01-26 ENCOUNTER — Other Ambulatory Visit: Payer: Self-pay | Admitting: Physician Assistant

## 2016-01-26 DIAGNOSIS — F419 Anxiety disorder, unspecified: Secondary | ICD-10-CM

## 2016-01-26 LAB — COMPREHENSIVE METABOLIC PANEL
ALT: 9 IU/L (ref 0–32)
AST: 15 IU/L (ref 0–40)
Albumin/Globulin Ratio: 1.8 (ref 1.2–2.2)
Albumin: 4.4 g/dL (ref 3.5–5.5)
Alkaline Phosphatase: 81 IU/L (ref 39–117)
BUN/Creatinine Ratio: 19 (ref 9–23)
BUN: 13 mg/dL (ref 6–24)
Bilirubin Total: 0.3 mg/dL (ref 0.0–1.2)
CALCIUM: 9.5 mg/dL (ref 8.7–10.2)
CO2: 25 mmol/L (ref 18–29)
CREATININE: 0.67 mg/dL (ref 0.57–1.00)
Chloride: 102 mmol/L (ref 96–106)
GFR calc Af Amer: 112 mL/min/{1.73_m2} (ref >59)
GFR, EST NON AFRICAN AMERICAN: 97 mL/min/{1.73_m2} (ref >59)
GLOBULIN, TOTAL: 2.5 g/dL (ref 1.5–4.5)
Glucose: 85 mg/dL (ref 65–99)
Potassium: 4.4 mmol/L (ref 3.5–5.2)
SODIUM: 141 mmol/L (ref 134–144)
Total Protein: 6.9 g/dL (ref 6.0–8.5)

## 2016-01-26 LAB — CBC WITH DIFFERENTIAL/PLATELET
Basophils Absolute: 0 10*3/uL (ref 0.0–0.2)
Basos: 1 %
EOS (ABSOLUTE): 0.2 10*3/uL (ref 0.0–0.4)
EOS: 4 %
HEMATOCRIT: 41 % (ref 34.0–46.6)
HEMOGLOBIN: 14 g/dL (ref 11.1–15.9)
IMMATURE GRANS (ABS): 0 10*3/uL (ref 0.0–0.1)
IMMATURE GRANULOCYTES: 0 %
LYMPHS: 23 %
Lymphocytes Absolute: 1.1 10*3/uL (ref 0.7–3.1)
MCH: 31.1 pg (ref 26.6–33.0)
MCHC: 34.1 g/dL (ref 31.5–35.7)
MCV: 91 fL (ref 79–97)
MONOCYTES: 8 %
MONOS ABS: 0.4 10*3/uL (ref 0.1–0.9)
NEUTROS PCT: 64 %
Neutrophils Absolute: 3.1 10*3/uL (ref 1.4–7.0)
Platelets: 278 10*3/uL (ref 150–379)
RBC: 4.5 x10E6/uL (ref 3.77–5.28)
RDW: 14.4 % (ref 12.3–15.4)
WBC: 4.8 10*3/uL (ref 3.4–10.8)

## 2016-01-26 LAB — TSH: TSH: 3.25 u[IU]/mL (ref 0.450–4.500)

## 2016-01-27 ENCOUNTER — Telehealth: Payer: Self-pay

## 2016-01-27 NOTE — Telephone Encounter (Signed)
-----   Message from Margo Common, [place] sent at 01/27/2016  3:22 PM EDT ----- All blood tests are normal. Schedule CPE in 4 months as planned.

## 2016-01-27 NOTE — Telephone Encounter (Signed)
Left message to call back  

## 2016-01-28 NOTE — Telephone Encounter (Signed)
Advised patient of results. Patient reports that she is still having the "spells" in the morning like she discussed with you during her visit. Patient reports that she feels hot and has sweats. She also reports that she has occasional dizziness. Patient thinks it could be related to her inner ear. I advised patient to continue her allergy meds as directed and to call the office if symptoms persist. Patient verbalized understanding and agrees to treatment plan.

## 2016-04-12 ENCOUNTER — Encounter: Payer: Self-pay | Admitting: Physician Assistant

## 2016-04-12 ENCOUNTER — Ambulatory Visit (INDEPENDENT_AMBULATORY_CARE_PROVIDER_SITE_OTHER): Payer: 59 | Admitting: Physician Assistant

## 2016-04-12 ENCOUNTER — Ambulatory Visit
Admission: RE | Admit: 2016-04-12 | Discharge: 2016-04-12 | Disposition: A | Discharge status: Home / Self Care | Payer: 59 | Source: Ambulatory Visit | Attending: Physician Assistant | Admitting: Physician Assistant

## 2016-04-12 VITALS — BP 142/88 | HR 68 | Temp 98.0°F | Resp 16 | Wt 154.0 lb

## 2016-04-12 DIAGNOSIS — M25512 Pain in left shoulder: Secondary | ICD-10-CM | POA: Diagnosis not present

## 2016-04-12 DIAGNOSIS — R102 Pelvic and perineal pain: Secondary | ICD-10-CM | POA: Diagnosis not present

## 2016-04-12 MED ORDER — MELOXICAM 7.5 MG PO TABS: 7.5000 mg | ORAL_TABLET | Freq: Every day | ORAL | 0 refills | Status: DC

## 2016-04-12 NOTE — Patient Instructions (Signed)
Rotator Cuff Injury Rotator cuff injury is any type of injury to the set of muscles and tendons that make up the stabilizing unit of your shoulder. This unit holds the ball of your upper arm bone (humerus) in the socket of your shoulder blade (scapula). What are the causes? Injuries to your rotator cuff most commonly come from sports or activities that cause your arm to be moved repeatedly over your head. Examples of this include throwing, weight lifting, swimming, or racquet sports. Long lasting (chronic) irritation of your rotator cuff can cause soreness and swelling (inflammation), bursitis, and eventual damage to your tendons, such as a tear (rupture). What are the signs or symptoms? Acute rotator cuff tear:  Sudden tearing sensation followed by severe pain shooting from your upper shoulder down your arm toward your elbow.  Decreased range of motion of your shoulder because of pain and muscle spasm.  Severe pain.  Inability to raise your arm out to the side because of pain and loss of muscle power (large tears). Chronic rotator cuff tear:  Pain that usually is worse at night and may interfere with sleep.  Gradual weakness and decreased shoulder motion as the pain worsens.  Decreased range of motion. Rotator cuff tendinitis:  Deep ache in your shoulder and the outside upper arm over your shoulder.  Pain that comes on gradually and becomes worse when lifting your arm to the side or turning it inward. How is this diagnosed? Rotator cuff injury is diagnosed through a medical history, physical exam, and imaging exam. The medical history helps determine the type of rotator cuff injury. Your health care provider will look at your injured shoulder, feel the injured area, and ask you to move your shoulder in different positions. X-ray exams typically are done to rule out other causes of shoulder pain, such as fractures. MRI is the exam of choice for the most severe shoulder injuries because the  images show muscles and tendons. How is this treated? Chronic tear:  Medicine for pain, such as acetaminophen or ibuprofen.  Physical therapy and range-of-motion exercises may be helpful in maintaining shoulder function and strength.  Steroid injections into your shoulder joint.  Surgical repair of the rotator cuff if the injury does not heal with noninvasive treatment. Acute tear:  Anti-inflammatory medicines such as ibuprofen and naproxen to help reduce pain and swelling.  A sling to help support your arm and rest your rotator cuff muscles. Long-term use of a sling is not advised. It may cause significant stiffening of the shoulder joint.  Surgery may be considered within a few weeks, especially in younger, active people, to return the shoulder to full function.  Indications for surgical treatment include the following:  Age younger than 60 years.  Rotator cuff tears that are complete.  Physical therapy, rest, and anti-inflammatory medicines have been used for 6-8 weeks, with no improvement.  Employment or sporting activity that requires constant shoulder use. Tendinitis:  Anti-inflammatory medicines such as ibuprofen and naproxen to help reduce pain and swelling.  A sling to help support your arm and rest your rotator cuff muscles. Long-term use of a sling is not advised. It may cause significant stiffening of the shoulder joint.  Severe tendinitis may require:  Steroid injections into your shoulder joint.  Physical therapy.  Surgery. Follow these instructions at home:  Apply ice to your injury:  Put ice in a plastic bag.  Place a towel between your skin and the bag.  Leave the ice on for   20 minutes, 2-3 times a day.  If you have a shoulder immobilizer (sling and straps), wear it until told otherwise by your health care provider.  You may want to sleep on several pillows or in a recliner at night to lessen swelling and pain.  Only take over-the-counter or  prescription medicines for pain, discomfort, or fever as directed by your health care provider.  Do simple hand squeezing exercises with a soft rubber ball to decrease hand swelling. Contact a health care provider if:  Your shoulder pain increases, or new pain or numbness develops in your arm, hand, or fingers.  Your hand or fingers are colder than your other hand. Get help right away if:  Your arm, hand, or fingers are numb or tingling.  Your arm, hand, or fingers are increasingly swollen and painful, or they turn white or blue. This information is not intended to replace advice given to you by your health care provider. Make sure you discuss any questions you have with your health care provider. Document Released: 04/21/2000 Document Revised: 09/30/2015 Document Reviewed: 12/04/2012 Elsevier Interactive Patient Education  2017 Elsevier Inc[employer]  

## 2016-04-12 NOTE — Progress Notes (Signed)
Patient: Gina [name] Female    DOB: 1955/11/06   60 y.o.   MRN: [id number] Visit Date: 04/12/2016  Today's Provider: Trinna Post, PA-C   Chief Complaint  Patient presents with  . Shoulder Pain    Left side; fell in August.     Subjective:    Shoulder Pain   The pain is present in the left shoulder. This is a new problem. The current episode started more than 1 month ago. There has been a history of trauma (Pt did fall in August.  She is not sure if it is related. ). The problem has been waxing and waning. The quality of the pain is described as aching. The pain is at a severity of 8/10. Associated symptoms include joint locking, joint swelling and a limited range of motion. Pertinent negatives include no numbness, stiffness or tingling. She has tried rest and NSAIDS for the symptoms. The treatment provided no relief.   Patient reports she fell in August onto her left outstretched hand and rolled onto her shoulder. She said she felt fine after this however. 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 Her pain is on an off for one month. Hurts to sleep on left shoulder. Hurts to put her coat on. Pain fluctuates, can be 8/10 at worst. Little relief with NSAIds.  Patient also has carbuncle in vaginal area she wants looked at. Has been doing warm Epsom soaks and Desitin Cream. Was very painful last week but pain has improved.    No Known Allergies   Current Outpatient Prescriptions:  .  ALPRAZolam (XANAX) 0.5 MG tablet, TAKE 1 TABLET BY MOUTH DURING DAY AND 1 TABLET AT BEDTIME, Disp: 60 tablet, Rfl: 1 .  amLODipine (NORVASC) 5 MG tablet, TAKE 1 TABLET BY MOUTH EVERY DAY, Disp: 90 tablet, Rfl: 1 .  cetirizine (ZYRTEC ALLERGY) 10 MG tablet, Take 1 tablet by mouth daily., Disp: , Rfl:  .  Cholecalciferol (VITAMIN D3) 1000 UNITS CAPS, Take 1 capsule by mouth daily., Disp: , Rfl:  .  clotrimazole-betamethasone (LOTRISONE) cream,  Apply 1 application topically 2 (two) times daily., Disp: 45 g, Rfl: 1 .  fluticasone (FLONASE) 50 MCG/ACT nasal spray, Place 2 sprays into both nostrils daily., Disp: 16 g, Rfl: 11 .  lansoprazole (PREVACID) 15 MG capsule, Take 1 capsule by mouth daily. , Disp: , Rfl:  .  montelukast (SINGULAIR) 10 MG tablet, TAKE 1 TABLET BY MOUTH EVERY DAY, Disp: 90 tablet, Rfl: 2 .  polycarbophil (FIBERCON) 625 MG tablet, Take 1 tablet by mouth daily. Reported on 08/13/2015, Disp: , Rfl:  .  triamcinolone cream (KENALOG) 0.1 %, Apply 1 application topically 2 (two) times daily., Disp: 80 g, Rfl: 0 .  B COMPLEX VITAMINS PO, Take 1 tablet by mouth daily., Disp: , Rfl:   Review of Systems  Constitutional: Negative.   Musculoskeletal: Positive for arthralgias, joint swelling and myalgias. Negative for back pain, gait problem, neck pain, neck stiffness and stiffness.  Neurological: Negative for tingling and numbness.    Social History  Substance Use Topics  . Smoking status: Never Smoker  . Smokeless tobacco: Never Used  . Alcohol use No   Objective:   BP (!) 142/88 (BP Location: Right Arm, Patient Position: Sitting, Cuff Size: Normal)   Pulse 68   Temp 98 F (36.7 C) (Oral)   Resp 16   Wt 154 lb (69.9 kg)  BMI 27.28 kg/m   Physical Exam  Constitutional: She appears well-developed and well-nourished. No distress.  Genitourinary: There is no rash, tenderness, lesion or injury on the right labia. There is no rash, tenderness, lesion or injury on the left labia.  Musculoskeletal:       Right shoulder: Normal.       Left shoulder: She exhibits tenderness and pain. She exhibits normal range of motion, no bony tenderness, no swelling, no effusion, no crepitus, no deformity, no laceration, no spasm, normal pulse and normal strength.  Left shoulder: Tenderness to palpation in scapular region. Negative Hawkins and Neer's impingement signs. Pain with belly press, but able to maintain arm in internally rotate  position. Pain with lift off test, but able to perform. Some weakness against resistance. No pain with external rotation. No painful arc.  Neurological: She has normal strength. No sensory deficit.        Assessment & Plan:      Problem List Items Addressed This Visit    None    Visit Diagnoses    Acute pain of left shoulder    -  Primary   Relevant Medications   meloxicam (MOBIC) 7.5 MG tablet   Other Relevant Orders   DG Shoulder Left   Vulvar pain         Patient is 60 y/o presenting with left shoulder pain concerning for rotator cuff injury and vulvar pain. Suspect rotator cuff injury, but low suspicion for full thickness tear. Will get xray and give pain relief hopefully to settle acute pain. Printed off rotator cuff strengthening exercises. Will refer patient to ortho if pain persists for further evaluation. Don't see any genital lesion today. Advised patient to continue with Epsom salt soaks and Desitin.   Return if symptoms worsen or fail to improve.   Patient Instructions  Rotator Cuff Injury Rotator cuff injury is any type of injury to the set of muscles and tendons that make up the stabilizing unit of your shoulder. This unit holds the ball of your upper arm bone (humerus) in the socket of your shoulder blade (scapula). What are the causes? Injuries to your rotator cuff most commonly come from sports or activities that cause your arm to be moved repeatedly over your head. Examples of this include throwing, weight lifting, swimming, or racquet sports. Long lasting (chronic) irritation of your rotator cuff can cause soreness and swelling (inflammation), bursitis, and eventual damage to your tendons, such as a tear (rupture). What are the signs or symptoms? Acute rotator cuff tear:  Sudden tearing sensation followed by severe pain shooting from your upper shoulder down your arm toward your elbow.  Decreased range of motion of your shoulder because of pain and muscle  spasm.  Severe pain.  Inability to raise your arm out to the side because of pain and loss of muscle power (large tears). Chronic rotator cuff tear:  Pain that usually is worse at night and may interfere with sleep.  Gradual weakness and decreased shoulder motion as the pain worsens.  Decreased range of motion. Rotator cuff tendinitis:  Deep ache in your shoulder and the outside upper arm over your shoulder.  Pain that comes on gradually and becomes worse when lifting your arm to the side or turning it inward. How is this diagnosed? Rotator cuff injury is diagnosed through a medical history, physical exam, and imaging exam. The medical history helps determine the type of rotator cuff injury. Your health care provider will look at your  injured shoulder, feel the injured area, and ask you to move your shoulder in different positions. X-ray exams typically are done to rule out other causes of shoulder pain, such as fractures. MRI is the exam of choice for the most severe shoulder injuries because the images show muscles and tendons. How is this treated? Chronic tear:  Medicine for pain, such as acetaminophen or ibuprofen.  Physical therapy and range-of-motion exercises may be helpful in maintaining shoulder function and strength.  Steroid injections into your shoulder joint.  Surgical repair of the rotator cuff if the injury does not heal with noninvasive treatment. Acute tear:  Anti-inflammatory medicines such as ibuprofen and naproxen to help reduce pain and swelling.  A sling to help support your arm and rest your rotator cuff muscles. Long-term use of a sling is not advised. It may cause significant stiffening of the shoulder joint.  Surgery may be considered within a few weeks, especially in younger, active people, to return the shoulder to full function.  Indications for surgical treatment include the following:  Age younger than 68 years.  Rotator cuff tears that are  complete.  Physical therapy, rest, and anti-inflammatory medicines have been used for 6-8 weeks, with no improvement.  Employment or sporting activity that requires constant shoulder use. Tendinitis:  Anti-inflammatory medicines such as ibuprofen and naproxen to help reduce pain and swelling.  A sling to help support your arm and rest your rotator cuff muscles. Long-term use of a sling is not advised. It may cause significant stiffening of the shoulder joint.  Severe tendinitis may require:  Steroid injections into your shoulder joint.  Physical therapy.  Surgery. Follow these instructions at home:  Apply ice to your injury:  Put ice in a plastic bag.  Place a towel between your skin and the bag.  Leave the ice on for 20 minutes, 2-3 times a day.  If you have a shoulder immobilizer (sling and straps), wear it until told otherwise by your health care provider.  You may want to sleep on several pillows or in a recliner at night to lessen swelling and pain.  Only take over-the-counter or prescription medicines for pain, discomfort, or fever as directed by your health care provider.  Do simple hand squeezing exercises with a soft rubber ball to decrease hand swelling. Contact a health care provider if:  Your shoulder pain increases, or new pain or numbness develops in your arm, hand, or fingers.  Your hand or fingers are colder than your other hand. Get help right away if:  Your arm, hand, or fingers are numb or tingling.  Your arm, hand, or fingers are increasingly swollen and painful, or they turn white or blue. This information is not intended to replace advice given to you by your health care provider. Make sure you discuss any questions you have with your health care provider. Document Released: 04/21/2000 Document Revised: 09/30/2015 Document Reviewed: 12/04/2012 Elsevier Interactive Patient Education  2017 [employer].    The entirety of the information  documented in the History of Present Illness, Review of Systems and Physical Exam were personally obtained by me. Portions of this information were initially documented by Ashley Royalty, CMA and reviewed by me for thoroughness and accuracy.   I have spent 25 minutes with this patient, >50% of which was spent on counseling and coordination of care.      Trinna Post, PA-C  [facility]

## 2016-04-13 ENCOUNTER — Telehealth: Payer: Self-pay | Admitting: Physician Assistant

## 2016-04-13 NOTE — Telephone Encounter (Signed)
Pt called saying she is having pain at night.  She wants to know if you can prescribe her something to help her sleep through the pain.  [facility] uses [facility] [place],  Her call back is [phone]  Thanks, Con Memos

## 2016-04-13 NOTE — Telephone Encounter (Signed)
Will provide work note, patient may pick up. Patient can use her Xanax at night for sleeping.

## 2016-04-13 NOTE — Telephone Encounter (Signed)
Advised patient as below.  

## 2016-04-13 NOTE — Telephone Encounter (Signed)
Pt stated she was here for an OV yesterday 04/12/16 and Fabio Bering advised her to be on light work but she forgot to get a note for work. Pt would like to come get the note today at lunch if possible. Please advise. Thanks TNP

## 2016-04-16 ENCOUNTER — Other Ambulatory Visit: Payer: Self-pay | Admitting: Family Medicine

## 2016-04-16 DIAGNOSIS — F419 Anxiety disorder, unspecified: Secondary | ICD-10-CM

## 2016-04-21 ENCOUNTER — Telehealth: Payer: Self-pay

## 2016-04-21 DIAGNOSIS — M25512 Pain in left shoulder: Secondary | ICD-10-CM

## 2016-04-21 MED ORDER — CYCLOBENZAPRINE HCL 10 MG PO TABS: 10.0000 mg | ORAL_TABLET | Freq: Every day | ORAL | 0 refills | Status: DC

## 2016-04-21 MED ORDER — MELOXICAM 7.5 MG PO TABS: 7.5000 mg | ORAL_TABLET | Freq: Every day | ORAL | 0 refills | Status: DC

## 2016-04-21 NOTE — Telephone Encounter (Signed)
Sent in flexeril. Take one pill before bed time. These are sedating, so no driving. Gave 2 weeks supply. If pain is persistent, might consider referring to ortho.

## 2016-04-21 NOTE — Telephone Encounter (Signed)
Pt advised.   She would also like a refill on Mobic to get her though for a few more weeks.    Thanks,   -Mickel Baas

## 2016-04-21 NOTE — Telephone Encounter (Signed)
Sent in [place].

## 2016-04-21 NOTE — Telephone Encounter (Signed)
Patient calling saying that her shoulder is still bothering her. She reports that the meloxicam is not helping. She is requesting a muscle relaxer to help with the muscle spasms that she is having. Patient reports that she still has to work, and her job requires lifting. She has also been doing the exercises that you recommended. [facility] [place] Contact info is correct. Thanks!

## 2016-05-02 ENCOUNTER — Other Ambulatory Visit: Payer: Self-pay | Admitting: Family Medicine

## 2016-05-03 NOTE — Telephone Encounter (Signed)
Last ov 04/12/16. Amlodipine last filled 11/18/15 montelukast last filled 08/16/15. Please review. Thank you. sd

## 2016-05-04 ENCOUNTER — Other Ambulatory Visit: Payer: Self-pay | Admitting: Physician Assistant

## 2016-05-04 DIAGNOSIS — M25512 Pain in left shoulder: Secondary | ICD-10-CM

## 2016-05-04 NOTE — Telephone Encounter (Signed)
Patient called and states her arm is still bothering her a lot, it did get better at first after seen you with medications but she said work did not put her on light duty like you asked them too and with Christmas having things to get done she thinks she aggravated it. She is trying to rest it as much as possible and doing exercises. She wanted to see if you refill her Meloxicam and Flexeril to get her through 2 more weeks and then she can try to get in with someone about this, she is waiting for insurance to switch over and start new plan and see what she will have to pay and all. Please let patient know. CB [phone]

## 2016-05-04 NOTE — Telephone Encounter (Signed)
Please review. sd 

## 2016-05-04 NOTE — Telephone Encounter (Signed)
I will refill the prescriptions this time but since pain has been persisting about a month, this is the last time I will refill it and then she should see ortho.

## 2016-05-21 ENCOUNTER — Other Ambulatory Visit: Payer: Self-pay | Admitting: Physician Assistant

## 2016-05-21 DIAGNOSIS — M25512 Pain in left shoulder: Secondary | ICD-10-CM

## 2016-05-22 ENCOUNTER — Encounter: Payer: Self-pay | Admitting: Physician Assistant

## 2016-05-22 ENCOUNTER — Ambulatory Visit (INDEPENDENT_AMBULATORY_CARE_PROVIDER_SITE_OTHER): Payer: 59 | Admitting: Physician Assistant

## 2016-05-22 VITALS — BP 140/80 | HR 79 | Resp 16 | Ht 63.0 in | Wt 152.2 lb

## 2016-05-22 DIAGNOSIS — R1310 Dysphagia, unspecified: Secondary | ICD-10-CM | POA: Diagnosis not present

## 2016-05-22 DIAGNOSIS — Z0000 Encounter for general adult medical examination without abnormal findings: Secondary | ICD-10-CM | POA: Diagnosis not present

## 2016-05-22 DIAGNOSIS — M62838 Other muscle spasm: Secondary | ICD-10-CM | POA: Diagnosis not present

## 2016-05-22 DIAGNOSIS — Z1231 Encounter for screening mammogram for malignant neoplasm of breast: Secondary | ICD-10-CM | POA: Diagnosis not present

## 2016-05-22 DIAGNOSIS — Z1239 Encounter for other screening for malignant neoplasm of breast: Secondary | ICD-10-CM

## 2016-05-22 DIAGNOSIS — M7542 Impingement syndrome of left shoulder: Secondary | ICD-10-CM

## 2016-05-22 DIAGNOSIS — Z008 Encounter for other general examination: Secondary | ICD-10-CM

## 2016-05-22 DIAGNOSIS — B372 Candidiasis of skin and nail: Secondary | ICD-10-CM

## 2016-05-22 DIAGNOSIS — Z0189 Encounter for other specified special examinations: Secondary | ICD-10-CM

## 2016-05-22 MED ORDER — MELOXICAM 15 MG PO TABS: 15.0000 mg | ORAL_TABLET | Freq: Every day | ORAL | 0 refills | Status: DC

## 2016-05-22 MED ORDER — CYCLOBENZAPRINE HCL 5 MG PO TABS: 5.0000 mg | ORAL_TABLET | Freq: Three times a day (TID) | ORAL | 1 refills | Status: DC | PRN

## 2016-05-22 MED ORDER — METHYLPREDNISOLONE ACETATE 80 MG/ML IJ SUSP
80.0000 mg | Freq: Once | INTRAMUSCULAR | Status: AC
  Administered 2016-05-22: 80 mg via INTRA_ARTICULAR

## 2016-05-22 MED ORDER — NYSTATIN 100000 UNIT/GM EX CREA: 1.0000 "application " | TOPICAL_CREAM | Freq: Two times a day (BID) | CUTANEOUS | 3 refills | Status: DC

## 2016-05-22 NOTE — Patient Instructions (Signed)
Shoulder Injection, Care After Refer to this sheet in the next few weeks. These instructions provide you with information about caring for yourself after your procedure. Your health care provider may also give you more specific instructions. Your treatment has been planned according to current medical practices, but problems sometimes occur. Call your health care provider if you have any problems or questions after your procedure. WHAT TO EXPECT AFTER THE PROCEDURE After your procedure, it is common to have:  Soreness.  Warmth.  Swelling. You may have more pain, swelling, and warmth than you did before the injection. This reaction may last for about one day.  HOME CARE INSTRUCTIONS Bathing  If you were given a bandage (dressing), keep it dry until your health care provider says it can be removed. Ask your health care provider when you can start showering or taking a bath. Managing Pain, Stiffness, and Swelling  If directed, apply ice to the injection area:  Put ice in a plastic bag.  Place a towel between your skin and the bag.  Leave the ice on for 20 minutes, 2-3 times per day.  Do not apply heat to your shoulder.  Raise the injection area above the level of your heart while you are sitting or lying down. Activity  Avoid strenuous activities for as long as directed by your health care provider. Ask your health care provider when you can return to your normal activities. General Instructions  Take medicines only as directed by your health care provider.  Do not take aspirin or other over-the-counter medicines unless your health care provider says you can.  Check your injection site every day for signs of infection. Watch for:  Redness, swelling, or pain.  Fluid, blood, or pus.  Follow your health care provider's instructions about dressing changes and removal. SEEK MEDICAL CARE IF:  You have symptoms at your injection site that last longer than two days after your  procedure.  You have redness, swelling, or pain in your injection area.  You have fluid, blood, or pus coming from your injection site.  You have warmth in your injection area.  You have a fever.  Your pain is not controlled with medicine. SEEK IMMEDIATE MEDICAL CARE IF:  Your shoulder turns very red.  Your shoulder becomes very swollen.  Your shoulder pain is severe.   This information is not intended to replace advice given to you by your health care provider. Make sure you discuss any questions you have with your health care provider.   Document Released: 05/15/2014 Document Reviewed: 05/15/2014 Elsevier Interactive Patient Education 2016 [employer] Inc.  

## 2016-05-22 NOTE — Progress Notes (Signed)
Patient: Gina [name], Female    DOB: June 13, 1955, 61 y.o.   MRN: [id number] Visit Date: 05/22/2016  Today's Provider: Mar Daring, PA-C   Chief Complaint  Patient presents with  . Annual Exam   Subjective:    Annual physical exam Gina [name] is a 61 y.o. female who presents today for health maintenance and complete physical. She feels fairly well. She reports exercising. She reports she is sleeping well.  Last CPE; 05/13/15 Pap 12/01/14-Negative Mammogram:06/24/15 BI-RADS 1 Colonoscopy: 05/10/13 Diverticulosis, Repeat in 10 years. Labs checked on 01/25/16-Normal -----------------------------------------------------------------   Review of Systems  Constitutional: Negative.   HENT: Negative.   Eyes: Negative.   Respiratory: Negative.   Cardiovascular: Negative.   Gastrointestinal: Negative.   Endocrine: Negative.   Genitourinary: Negative.   Musculoskeletal: Positive for arthralgias.  Skin: Positive for rash.  Allergic/Immunologic: Negative.   Neurological: Negative.   Hematological: Negative.   Psychiatric/Behavioral: Negative.     Social History      She  reports that she has never smoked. She has never used smokeless tobacco. She reports that she does not drink alcohol or use drugs.       Social History   Social History  . Marital status: Married    Spouse name: N/A  . Number of children: N/A  . Years of education: N/A   Social History Main Topics  . Smoking status: Never Smoker  . Smokeless tobacco: Never Used  . Alcohol use No  . Drug use: No  . Sexual activity: Not Asked   Other Topics Concern  . None   Social History Narrative  . None    History reviewed. No pertinent past medical history.   Patient Active Problem List   Diagnosis Date Noted  . Anxiety 12/01/2014  . Mucous polyp of cervix 12/01/2014  . Diverticulosis of colon 12/01/2014  . Acid reflux 12/01/2014  . HCV antibody positive 12/01/2014  . BP (high  blood pressure) 12/01/2014  . Irritable colon 12/01/2014  . Presence of intrauterine contraceptive device 12/01/2014  . Migraine 12/01/2014  . Psoriasis 12/01/2014  . Avitaminosis D 12/01/2014    Past Surgical History:  Procedure Laterality Date  . DILATION AND CURETTAGE OF UTERUS  1998  . FOOT SURGERY     08/2007    Family History        Family Status  Relation Status  . Mother Deceased  . Father Deceased  . Sister Alive  . Brother Deceased  . Maternal Grandfather Deceased  . Paternal Grandfather Deceased  . Maternal Grandmother Deceased  . Paternal Grandmother Deceased  . Paternal Aunt         Her family history includes Alzheimer's disease in her paternal grandfather; Arthritis in her mother and sister; Breast cancer in her paternal aunt; COPD in her mother; Coronary artery disease in her father; Emphysema in her mother; Heart attack in her maternal grandfather; Heart disease in her father; Hypertension in her father; Throat cancer in her brother.     No Known Allergies   Current Outpatient Prescriptions:  .  ALPRAZolam (XANAX) 0.5 MG tablet, TAKE 1 TABLET BY MOUTH DURING THE DAY AND 1 TABLET AT BEDTIME, Disp: 60 tablet, Rfl: 1 .  amLODipine (NORVASC) 5 MG tablet, TAKE 1 TABLET BY MOUTH EVERY DAY, Disp: 90 tablet, Rfl: 3 .  B COMPLEX VITAMINS PO, Take 1 tablet by mouth daily., Disp: , Rfl:  .  cetirizine (ZYRTEC ALLERGY) 10 MG tablet, Take  1 tablet by mouth daily., Disp: , Rfl:  .  Cholecalciferol (VITAMIN D3) 1000 UNITS CAPS, Take 1 capsule by mouth daily., Disp: , Rfl:  .  clotrimazole-betamethasone (LOTRISONE) cream, Apply 1 application topically 2 (two) times daily., Disp: 45 g, Rfl: 1 .  fluticasone (FLONASE) 50 MCG/ACT nasal spray, Place 2 sprays into both nostrils daily., Disp: 16 g, Rfl: 11 .  lansoprazole (PREVACID) 15 MG capsule, Take 1 capsule by mouth daily. , Disp: , Rfl:  .  montelukast (SINGULAIR) 10 MG tablet, TAKE 1 TABLET BY MOUTH EVERY DAY, Disp: 90  tablet, Rfl: 3 .  polycarbophil (FIBERCON) 625 MG tablet, Take 1 tablet by mouth daily. Reported on 08/13/2015, Disp: , Rfl:  .  triamcinolone cream (KENALOG) 0.1 %, Apply 1 application topically 2 (two) times daily., Disp: 80 g, Rfl: 0   Patient Care Team: Margo Common, PA as PCP - General (Physician Assistant)      Objective:   Vitals: BP 140/80 (BP Location: Right Arm, Patient Position: Sitting, Cuff Size: Normal)   Pulse 79   Resp 16   Ht 5\' 3"  (1.6 m)   Wt 152 lb 3.2 oz (69 kg)   BMI 26.96 kg/m    Physical Exam  Constitutional: She is oriented to person, place, and time. She appears well-developed and well-nourished. No distress.  HENT:  Head: Normocephalic and atraumatic.  Right Ear: Hearing, tympanic membrane, external ear and ear canal normal.  Left Ear: Hearing, tympanic membrane, external ear and ear canal normal.  Nose: Nose normal.  Mouth/Throat: Uvula is midline, oropharynx is clear and moist and mucous membranes are normal. No oropharyngeal exudate.  Eyes: Conjunctivae and EOM are normal. Pupils are equal, round, and reactive to light. Right eye exhibits no discharge. Left eye exhibits no discharge. No scleral icterus.  Neck: Normal range of motion. Neck supple. No JVD present. Carotid bruit is not present. No tracheal deviation present. No thyromegaly present.  Cardiovascular: Normal rate, regular rhythm, normal heart sounds and intact distal pulses.  Exam reveals no gallop and no friction rub.   No murmur heard. Pulmonary/Chest: Effort normal and breath sounds normal. No respiratory distress. She has no wheezes. She has no rales. She exhibits no tenderness. Right breast exhibits skin change. Right breast exhibits no inverted nipple, no mass, no nipple discharge and no tenderness. Left breast exhibits skin change. Left breast exhibits no inverted nipple, no mass, no nipple discharge and no tenderness. Breasts are symmetrical.    Abdominal: Soft. Bowel sounds are  normal. She exhibits no distension and no mass. There is no tenderness. There is no rebound and no guarding.  Musculoskeletal: She exhibits no edema.       Right shoulder: Normal. She exhibits normal range of motion, no tenderness, no bony tenderness, no swelling, no deformity, no pain, no spasm, normal pulse and normal strength.       Left shoulder: She exhibits decreased range of motion, tenderness, pain, spasm and decreased strength. She exhibits no bony tenderness, no swelling, no effusion, no crepitus, no deformity, no laceration and normal pulse.  Lymphadenopathy:    She has no cervical adenopathy.  Neurological: She is alert and oriented to person, place, and time.  Skin: Skin is warm and dry. No rash noted. She is not diaphoretic.  Psychiatric: She has a normal mood and affect. Her behavior is normal. Judgment and thought content normal.  Vitals reviewed.    Depression Screen No flowsheet data found.    Assessment &  Plan:     Routine Health Maintenance and Physical Exam  Exercise Activities and Dietary recommendations Goals    None      Immunization History  Administered Date(s) Administered  . Tdap 01/20/2009    Health Maintenance  Topic Date Due  . Hepatitis C Screening  23-May-1955  . HIV Screening  10/01/1972  . COLONOSCOPY  10/02/2007  . INFLUENZA VACCINE  12/07/2015  . MAMMOGRAM  06/23/2017  . PAP SMEAR  11/30/2017  . TETANUS/TDAP  01/21/2019     Discussed health benefits of physical activity, and encouraged her to engage in regular exercise appropriate for her age and condition.    1. Annual physical exam Normal physical exam today. Will see her in one year at her next annual physical exam. She is to call the office in the meantime if she has any acute issue, questions or concerns.  2. Breast cancer screening Breast exam today was normal. There is no family history of breast cancer. She does perform regular self breast exams. Mammogram was ordered as  below. Information for [facility] was given to patient so she may schedule her mammogram at her convenience. - MM DIGITAL SCREENING BILATERAL; Future  3. 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 Has used ice, meloxicam and flexeril. This does help. Has done exercises on her own but no formal PT. Right shoulder xray reviewed today. Steroid injection given today in the OV. See procedure note below. Patient is to call the office if symptoms worsen. Offered PT but patient wants to wait until work slows down. She will call when she is interested in starting.  - meloxicam (MOBIC) 15 MG tablet; Take 1 tablet (15 mg total) by mouth daily.  Dispense: 30 tablet; Refill: 0 - methylPREDNISolone acetate (DEPO-MEDROL) injection 80 mg; Inject 1 mL (80 mg total) into the articular space once.  4. Muscle spasm See above medical treatment plan. - cyclobenzaprine (FLEXERIL) 5 MG tablet; Take 1 tablet (5 mg total) by mouth 3 (three) times daily as needed for muscle spasms.  Dispense: 30 tablet; Refill: 1  5. Yeast infection of the skin Will start nystatin cream as below for infection under the breast. She is to call if symptoms donot improve.  - nystatin cream (MYCOSTATIN); Apply 1 application topically 2 (two) times daily.  Dispense: 30 g; Refill: 3  6. Dysphagia, unspecified type Unknown cause, esophageal stricture vs hiatal hernia. Will get swallowing study as below and f/u pending results.  - DG Swallowing Func-Speech Pathology; Future   Procedure Note: Benefits, risks (including infection, tattooing, adipose dimpling, and tendon rupture) and alternatives were explained to the patient. All questions were sought and answered.  Patient agreed to continue and verbal consent was obtained.   A steroid injection was performed on left shoulder using 4cc of 1% plain Xylocaine and 80 mg of depo-medrol. This was well  tolerated.  --------------------------------------------------------------------    Mar Daring, PA-C  [facility]

## 2016-05-23 NOTE — Telephone Encounter (Signed)
Cyclobenzaprine was refilled by Fenton Malling, PA-C yesterday and chart shows confirmation of receipt at the [facility].

## 2016-06-09 ENCOUNTER — Ambulatory Visit
Admission: RE | Admit: 2016-06-09 | Discharge: 2016-06-09 | Disposition: A | Discharge status: Home / Self Care | Payer: 59 | Source: Ambulatory Visit | Attending: Physician Assistant | Admitting: Physician Assistant

## 2016-06-09 DIAGNOSIS — R1312 Dysphagia, oropharyngeal phase: Secondary | ICD-10-CM | POA: Insufficient documentation

## 2016-06-09 NOTE — Therapy (Signed)
[facility] [place], [place], [address] Phone: [phone]   Fax:     Modified Barium Swallow  Patient Details  Name: Gina [name] MRN: [id number] Date of Birth: Oct 26, 1955 No Data Recorded  Encounter Date: 06/09/2016      End of Session - 06/09/16 1413    Visit Number 1   Number of Visits 1   Date for SLP Re-Evaluation 06/09/16   SLP Start Time 81   SLP Stop Time  1350   SLP Time Calculation (min) 50 min   Activity Tolerance Patient tolerated treatment well      No past medical history on file.  Past Surgical History:  Procedure Laterality Date  . DILATION AND CURETTAGE OF UTERUS  1998  . FOOT SURGERY     08/2007    There were no vitals filed for this visit.    Subjective: Patient behavior: (alertness, ability to follow instructions, etc.): Patient alert, able to follow directions and understand information provided  Chief complaint: patient regurgitates when some foods (meat, bread) gets stuck, burning sensation in the esophagus with some foods   Objective:  Radiological Procedure: A videoflouroscopic evaluation of oral-preparatory, reflex initiation, and pharyngeal phases of the swallow was performed; as well as a screening of the upper esophageal phase.  I. POSTURE: Upright in MBS chair, standing  II. VIEW: Lateral and A-P  III. COMPENSATORY STRATEGIES: N/A  IV. BOLUSES ADMINISTERED:   Thin Liquid: 2 cup rim sips   Nectar-thick Liquid: 2 moderate size sips    Puree: 2 teaspoon presentations   Mechanical Soft: 1/4 graham cracker in applesauce  V. RESULTS OF EVALUATION: A. ORAL PREPARATORY PHASE: (The lips, tongue, and velum are observed for strength and coordination)       **Overall Severity Rating: Within normal limits  B. SWALLOW INITIATION/REFLEX: (The reflex is normal if "triggered" by the time the bolus reached the base of the tongue)  **Overall Severity Rating:  Minimal; triggers at the valleculae  C. PHARYNGEAL PHASE: (Pharyngeal function is normal if the bolus shows rapid, smooth, and continuous transit through the pharynx and there is no pharyngeal residue after the swallow)  **Overall Severity Rating: Minimal; reduced anterior hyoid movement, incomplete epiglottic inversion, curled epiglottis, vallecular residue  D. LARYNGEAL PENETRATION: (Material entering into the laryngeal inlet/vestibule but not aspirated) None  E. ASPIRATION: None  F. ESOPHAGEAL PHASE: (Screening of the upper esophagus): An esophageal sweep showed stasis of a small portion of the solid bolus in the mid-esophagus.    ASSESSMENT: This 61 year old woman; with worsening dysphagia (causes patient to regurgitate when it gets stuck, burning sensation in the esophagus with some foods); is presenting with minimal oropharyngeal dysphagia.  Oral control of the bolus including oral hold, rotary mastication, and anterior to posterior transfer are within normal limits. Timing of the pharyngeal swallow is minimally delayed, triggering at the valleculae.  Aspects of the pharyngeal stage of swallowing including tongue base retraction, hyolaryngeal excursion, and epiglottic inversion are minimally reduced with trace-to-mild vallecular residue.   There was no observed laryngeal penetration or tracheal aspiration.  An esophageal sweep showed stasis of a small portion of the solid bolus in the mid-esophagus.  The patient was counseled to avoid and/or modify problematic foods.  She should monitor for any increase in symptoms and seek referral to GI.  PLAN/RECOMMENDATIONS:   A. Diet: Regular as tolerated   B. Swallowing Precautions: Soften/ moisten foods as needed, avoid foods causing burning  sensation, monitor for any increase in frequency or foods causing problems.   C. Recommended consultation to: GI if symptoms worsen   D. Therapy recommendations N/A   E. Results and recommendations were  discussed with the patient immediately following the study and the final report routed to the referring practitioner.    Oropharyngeal dysphagia - Plan: DG Swallowing Func-Speech Pathology, DG Swallowing Func-Speech Pathology        Problem List Patient Active Problem List   Diagnosis Date Noted  . Anxiety 12/01/2014  . Mucous polyp of cervix 12/01/2014  . Diverticulosis of colon 12/01/2014  . Acid reflux 12/01/2014  . HCV antibody positive 12/01/2014  . BP (high blood pressure) 12/01/2014  . Irritable colon 12/01/2014  . Presence of intrauterine contraceptive device 12/01/2014  . Migraine 12/01/2014  . Psoriasis 12/01/2014  . Avitaminosis D 12/01/2014   Leroy Sea, MS/CCC- SLP  Lou Miner 06/09/2016, 2:14 PM  [facility] [place], [place], [address] Phone: [phone]   Fax:     Name: Gina [name] MRN: [id number] Date of Birth: 1956/02/25

## 2016-06-14 ENCOUNTER — Telehealth: Payer: Self-pay

## 2016-06-14 DIAGNOSIS — B372 Candidiasis of skin and nail: Secondary | ICD-10-CM

## 2016-06-14 MED ORDER — NYSTATIN-TRIAMCINOLONE 100000-0.1 UNIT/GM-% EX OINT: 1.0000 "application " | TOPICAL_OINTMENT | Freq: Two times a day (BID) | CUTANEOUS | 0 refills | Status: DC

## 2016-06-14 NOTE — Telephone Encounter (Signed)
Patient called and states she saw you recently and was given a cream to help with the break our/red area under breasts she was having. She states she has been using the cream you gave her and area still looks the same. Not worse or better. She states she sweats a lot. Anything else she can try? It is not spreading just not getting better=-aa

## 2016-06-14 NOTE — Telephone Encounter (Signed)
Nystatin + triamcinolone cream sent in to [facility]. Also may benefit from using a powder under her breast for drying or interdry pads.

## 2016-06-14 NOTE — Telephone Encounter (Signed)
Patient advised.

## 2016-06-19 ENCOUNTER — Other Ambulatory Visit: Payer: Self-pay | Admitting: Physician Assistant

## 2016-06-19 NOTE — Addendum Note (Signed)
Addended by: Mar Daring on: 06/19/2016 01:44 PM   Modules accepted: Orders

## 2016-06-26 ENCOUNTER — Other Ambulatory Visit: Payer: Self-pay | Admitting: Physician Assistant

## 2016-06-26 DIAGNOSIS — M7542 Impingement syndrome of left shoulder: Secondary | ICD-10-CM

## 2016-06-29 ENCOUNTER — Other Ambulatory Visit: Payer: Self-pay | Admitting: Family Medicine

## 2016-06-29 DIAGNOSIS — F419 Anxiety disorder, unspecified: Secondary | ICD-10-CM

## 2016-06-29 NOTE — Telephone Encounter (Signed)
[facility]  

## 2016-06-29 NOTE — Telephone Encounter (Signed)
Will print refill. Remind patient to schedule follow up in 6-8 weeks regarding anxiety and use of Alprazolam.

## 2016-07-06 LAB — LIPID PANEL
CHOL/HDL RATIO: 2.6 ratio (ref 0.0–4.4)
Cholesterol, Total: 191 mg/dL (ref 100–199)
HDL: 73 mg/dL (ref >39)
LDL Calculated: 104 mg/dL — ABNORMAL HIGH (ref 0–99)
TRIGLYCERIDES: 70 mg/dL (ref 0–149)
VLDL Cholesterol Cal: 14 mg/dL (ref 5–40)

## 2016-07-06 LAB — GLUCOSE, RANDOM: Glucose: 80 mg/dL (ref 65–99)

## 2016-07-11 ENCOUNTER — Ambulatory Visit
Admission: RE | Admit: 2016-07-11 | Discharge: 2016-07-11 | Disposition: A | Discharge status: Home / Self Care | Payer: 59 | Source: Ambulatory Visit | Attending: Physician Assistant | Admitting: Physician Assistant

## 2016-07-11 DIAGNOSIS — Z1231 Encounter for screening mammogram for malignant neoplasm of breast: Secondary | ICD-10-CM | POA: Insufficient documentation

## 2016-07-11 DIAGNOSIS — Z1239 Encounter for other screening for malignant neoplasm of breast: Secondary | ICD-10-CM

## 2016-07-12 ENCOUNTER — Telehealth: Payer: Self-pay

## 2016-07-12 NOTE — Telephone Encounter (Signed)
-----   Message from Mar Daring, PA-C sent at 07/12/2016  8:29 AM EST ----- Normal mammogram. Repeat screening in one year.

## 2016-07-12 NOTE — Telephone Encounter (Signed)
LMTCB 07/12/2016   Thanks,   -Mickel Baas

## 2016-07-13 NOTE — Telephone Encounter (Signed)
Pt advised.   Thanks,   -Lyndia Bury  

## 2016-07-24 ENCOUNTER — Other Ambulatory Visit: Payer: Self-pay | Admitting: Physician Assistant

## 2016-07-24 DIAGNOSIS — M7542 Impingement syndrome of left shoulder: Secondary | ICD-10-CM

## 2016-07-24 NOTE — Telephone Encounter (Signed)
Last ov 05/22/16 Last filled 06/26/16 Please review. Thank you. sd

## 2016-07-29 ENCOUNTER — Other Ambulatory Visit: Payer: Self-pay | Admitting: Physician Assistant

## 2016-07-29 DIAGNOSIS — M62838 Other muscle spasm: Secondary | ICD-10-CM

## 2016-08-02 NOTE — Telephone Encounter (Signed)
Was refilled on 06-29-16 with an extra refill. See encounter note of that date (be sure to [facility] box next to "uncheck to show all").

## 2016-08-27 ENCOUNTER — Other Ambulatory Visit: Payer: Self-pay | Admitting: Physician Assistant

## 2016-08-27 DIAGNOSIS — M7542 Impingement syndrome of left shoulder: Secondary | ICD-10-CM

## 2016-09-16 ENCOUNTER — Other Ambulatory Visit: Payer: Self-pay | Admitting: Family Medicine

## 2016-09-16 DIAGNOSIS — F419 Anxiety disorder, unspecified: Secondary | ICD-10-CM

## 2016-09-18 NOTE — Telephone Encounter (Signed)
L/M stating below. Rx placed up front.

## 2016-09-18 NOTE — Telephone Encounter (Signed)
Printed refill and remind patient she needs follow up appointment before further refills needed (1 month).

## 2016-10-01 ENCOUNTER — Other Ambulatory Visit: Payer: Self-pay | Admitting: Physician Assistant

## 2016-10-03 NOTE — Telephone Encounter (Signed)
Last refill 08/13/2015. Renaldo Fiddler, CMA

## 2016-10-26 ENCOUNTER — Ambulatory Visit (INDEPENDENT_AMBULATORY_CARE_PROVIDER_SITE_OTHER): Payer: 59 | Admitting: Physician Assistant

## 2016-10-26 ENCOUNTER — Encounter: Payer: Self-pay | Admitting: Physician Assistant

## 2016-10-26 VITALS — BP 130/84 | HR 86 | Temp 98.6°F | Resp 16 | Wt 153.0 lb

## 2016-10-26 DIAGNOSIS — J0100 Acute maxillary sinusitis, unspecified: Secondary | ICD-10-CM | POA: Diagnosis not present

## 2016-10-26 DIAGNOSIS — F419 Anxiety disorder, unspecified: Secondary | ICD-10-CM

## 2016-10-26 DIAGNOSIS — I10 Essential (primary) hypertension: Secondary | ICD-10-CM

## 2016-10-26 MED ORDER — AMOXICILLIN-POT CLAVULANATE 875-125 MG PO TABS: 1.0000 | ORAL_TABLET | Freq: Two times a day (BID) | ORAL | 0 refills | Status: DC

## 2016-10-26 MED ORDER — ALPRAZOLAM 0.5 MG PO TABS: ORAL_TABLET | ORAL | 5 refills | Status: DC

## 2016-10-26 NOTE — Patient Instructions (Signed)
DASH Eating Plan DASH stands for "Dietary Approaches to Stop Hypertension." The DASH eating plan is a healthy eating plan that has been shown to reduce high blood pressure (hypertension). It may also reduce your risk for type 2 diabetes, heart disease, and stroke. The DASH eating plan may also help with weight loss. What are tips for following this plan? General guidelines  Avoid eating more than 2,300 mg (milligrams) of salt (sodium) a day. If you have hypertension, you may need to reduce your sodium intake to 1,500 mg a day.  Limit alcohol intake to no more than 1 drink a day for nonpregnant women and 2 drinks a day for men. One drink equals 12 oz of beer, 5 oz of wine, or 1 oz of hard liquor.  Work with your health care provider to maintain a healthy body weight or to lose weight. Ask what an ideal weight is for you.  Get at least 30 minutes of exercise that causes your heart to beat faster (aerobic exercise) most days of the week. Activities may include walking, swimming, or biking.  Work with your health care provider or diet and nutrition specialist (dietitian) to adjust your eating plan to your individual calorie needs. Reading food labels  Check food labels for the amount of sodium per serving. Choose foods with less than 5 percent of the Daily Value of sodium. Generally, foods with less than 300 mg of sodium per serving fit into this eating plan.  To find whole grains, look for the word "whole" as the first word in the ingredient list. Shopping  Buy products labeled as "low-sodium" or "no salt added."  Buy fresh foods. Avoid canned foods and premade or frozen meals. Cooking  Avoid adding salt when cooking. Use salt-free seasonings or herbs instead of table salt or sea salt. Check with your health care provider or pharmacist before using salt substitutes.  Do not fry foods. Cook foods using healthy methods such as baking, boiling, grilling, and broiling instead.  Cook with  heart-healthy oils, such as olive, canola, soybean, or sunflower oil. Meal planning   Eat a balanced diet that includes: ? 5 or more servings of fruits and vegetables each day. At each meal, try to fill half of your plate with fruits and vegetables. ? Up to 6-8 servings of whole grains each day. ? Less than 6 oz of lean meat, poultry, or fish each day. A 3-oz serving of meat is about the same size as a deck of cards. One egg equals 1 oz. ? 2 servings of low-fat dairy each day. ? A serving of nuts, seeds, or beans 5 times each week. ? Heart-healthy fats. Healthy fats called Omega-3 fatty acids are found in foods such as flaxseeds and coldwater fish, like sardines, salmon, and mackerel.  Limit how much you eat of the following: ? Canned or prepackaged foods. ? Food that is high in trans fat, such as fried foods. ? Food that is high in saturated fat, such as fatty meat. ? Sweets, desserts, sugary drinks, and other foods with added sugar. ? Full-fat dairy products.  Do not salt foods before eating.  Try to eat at least 2 vegetarian meals each week.  Eat more home-cooked food and less restaurant, buffet, and fast food.  When eating at a restaurant, ask that your food be prepared with less salt or no salt, if possible. What foods are recommended? The items listed may not be a complete list. Talk with your dietitian about what   dietary choices are best for you. Grains Whole-grain or whole-wheat bread. Whole-grain or whole-wheat pasta. Brown rice. Oatmeal. Quinoa. Bulgur. Whole-grain and low-sodium cereals. Pita bread. Low-fat, low-sodium crackers. Whole-wheat flour tortillas. Vegetables Fresh or frozen vegetables (raw, steamed, roasted, or grilled). Low-sodium or reduced-sodium tomato and vegetable juice. Low-sodium or reduced-sodium tomato sauce and tomato paste. Low-sodium or reduced-sodium canned vegetables. Fruits All fresh, dried, or frozen fruit. Canned fruit in natural juice (without  added sugar). Meat and other protein foods Skinless chicken or turkey. Ground chicken or turkey. Pork with fat trimmed off. Fish and seafood. Egg whites. Dried beans, peas, or lentils. Unsalted nuts, nut butters, and seeds. Unsalted canned beans. Lean cuts of beef with fat trimmed off. Low-sodium, lean deli meat. Dairy Low-fat (1%) or fat-free (skim) milk. Fat-free, low-fat, or reduced-fat cheeses. Nonfat, low-sodium ricotta or cottage cheese. Low-fat or nonfat yogurt. Low-fat, low-sodium cheese. Fats and oils Soft margarine without trans fats. Vegetable oil. Low-fat, reduced-fat, or light mayonnaise and salad dressings (reduced-sodium). Canola, safflower, olive, soybean, and sunflower oils. Avocado. Seasoning and other foods Herbs. Spices. Seasoning mixes without salt. Unsalted popcorn and pretzels. Fat-free sweets. What foods are not recommended? The items listed may not be a complete list. Talk with your dietitian about what dietary choices are best for you. Grains Baked goods made with fat, such as croissants, muffins, or some breads. Dry pasta or rice meal packs. Vegetables Creamed or fried vegetables. Vegetables in a cheese sauce. Regular canned vegetables (not low-sodium or reduced-sodium). Regular canned tomato sauce and paste (not low-sodium or reduced-sodium). Regular tomato and vegetable juice (not low-sodium or reduced-sodium). Pickles. Olives. Fruits Canned fruit in a light or heavy syrup. Fried fruit. Fruit in cream or butter sauce. Meat and other protein foods Fatty cuts of meat. Ribs. Fried meat. Bacon. Sausage. Bologna and other processed lunch meats. Salami. Fatback. Hotdogs. Bratwurst. Salted nuts and seeds. Canned beans with added salt. Canned or smoked fish. Whole eggs or egg yolks. Chicken or turkey with skin. Dairy Whole or 2% milk, cream, and half-and-half. Whole or full-fat cream cheese. Whole-fat or sweetened yogurt. Full-fat cheese. Nondairy creamers. Whipped toppings.  Processed cheese and cheese spreads. Fats and oils Butter. Stick margarine. Lard. Shortening. Ghee. Bacon fat. Tropical oils, such as coconut, palm kernel, or palm oil. Seasoning and other foods Salted popcorn and pretzels. Onion salt, garlic salt, seasoned salt, table salt, and sea salt. Worcestershire sauce. Tartar sauce. Barbecue sauce. Teriyaki sauce. Soy sauce, including reduced-sodium. Steak sauce. Canned and packaged gravies. Fish sauce. Oyster sauce. Cocktail sauce. Horseradish that you find on the shelf. Ketchup. Mustard. Meat flavorings and tenderizers. Bouillon cubes. Hot sauce and Tabasco sauce. Premade or packaged marinades. Premade or packaged taco seasonings. Relishes. Regular salad dressings. Where to find more information:  National Heart, Lung, and Blood Institute: www.nhlbi.nih.gov  American Heart Association: www.heart.org Summary  The DASH eating plan is a healthy eating plan that has been shown to reduce high blood pressure (hypertension). It may also reduce your risk for type 2 diabetes, heart disease, and stroke.  With the DASH eating plan, you should limit salt (sodium) intake to 2,300 mg a day. If you have hypertension, you may need to reduce your sodium intake to 1,500 mg a day.  When on the DASH eating plan, aim to eat more fresh fruits and vegetables, whole grains, lean proteins, low-fat dairy, and heart-healthy fats.  Work with your health care provider or diet and nutrition specialist (dietitian) to adjust your eating plan to your individual   calorie needs. This information is not intended to replace advice given to you by your health care provider. Make sure you discuss any questions you have with your health care provider. Document Released: 04/13/2011 Document Revised: 04/17/2016 Document Reviewed: 04/17/2016 Elsevier Interactive Patient Education  2017 Elsevier Inc.  

## 2016-10-26 NOTE — Progress Notes (Signed)
Patient: Gina [name] Female    DOB: [birth year]   61 y.o.   MRN: [id number] Visit Date: 10/26/2016  Today's Provider: Mar Daring, PA-C   Chief Complaint  Patient presents with  . Hypertension   Subjective:    HPI  Hypertension, follow-up:  BP Readings from Last 3 Encounters:  10/26/16 130/84  05/22/16 140/80  04/12/16 (!) 142/88    She was last seen for hypertension 6 months ago.  BP at that visit was 140/80. Management changes since that visit include no changes. She reports excellent compliance with treatment. She is not having side effects.  She is exercising. She is adherent to low salt diet.   Outside blood pressures are stable. She is experiencing none.  Patient denies chest pain.   Cardiovascular risk factors include hypertension.  Use of agents associated with hypertension: none.     Weight trend: stable Wt Readings from Last 3 Encounters:  10/26/16 153 lb (69.4 kg)  05/22/16 152 lb 3.2 oz (69 kg)  04/12/16 154 lb (69.9 kg)    Current diet: in general, a "healthy" diet    ------------------------------------------------------------------------   Anxiety, Follow-up  She  was last seen for this 6 months ago. Changes made at last visit include no changes.   She reports excellent compliance with treatment. She is not having side effects.   She reports excellent tolerance of treatment. Current symptoms include: none She feels she is Improved since last visit. ------------------------------------------------------------------------  Patient C/O of headache, cough, and a lot of mucus x's 1 week. Patient reports taking allergy medication and Flonase. Patient reports that she gets coughing spells at work. She reports she noticed symptoms at the beach last week, but they have been worsening symptoms since she got home despite taking her allergy medication.   She reports that last month she had an episode at work that caused her to get  really upset that she almost walked out. She was able to vent to her boss and then took a vacation that has helped. She reports that she is trying to get through the next two years so she will have a total of 25 years there and can retire. She is having increased stress and anxiety with work.    No Known Allergies   Current Outpatient Prescriptions:  .  ALPRAZolam (XANAX) 0.5 MG tablet, TAKE 1 TABLET BY MOUTH EVERY DAY AND AT BEDTIME NEED APPT FOR MORE REFILLS, Disp: 60 tablet, Rfl: 0 .  amLODipine (NORVASC) 5 MG tablet, TAKE 1 TABLET BY MOUTH EVERY DAY, Disp: 90 tablet, Rfl: 3 .  B COMPLEX VITAMINS PO, Take 1 tablet by mouth daily., Disp: , Rfl:  .  cetirizine (ZYRTEC ALLERGY) 10 MG tablet, Take 1 tablet by mouth daily., Disp: , Rfl:  .  Cholecalciferol (VITAMIN D3) 1000 UNITS CAPS, Take 1 capsule by mouth daily., Disp: , Rfl:  .  clotrimazole-betamethasone (LOTRISONE) cream, Apply 1 application topically 2 (two) times daily., Disp: 45 g, Rfl: 1 .  fluticasone (FLONASE) 50 MCG/ACT nasal spray, Place 2 sprays into both nostrils daily., Disp: 16 g, Rfl: 11 .  lansoprazole (PREVACID) 15 MG capsule, Take 1 capsule by mouth daily. , Disp: , Rfl:  .  meloxicam (MOBIC) 15 MG tablet, TAKE 1 TABLET (15 MG TOTAL) BY MOUTH DAILY., Disp: 30 tablet, Rfl: 5 .  montelukast (SINGULAIR) 10 MG tablet, TAKE 1 TABLET BY MOUTH EVERY DAY, Disp: 90 tablet, Rfl: 3 .  triamcinolone ointment (KENALOG)  0.1 %, APPLY 1 APPLICATION TOPICALLY 2 TIMES DAILY. MIX WITH NYSTATIN**, Disp: 30 g, Rfl: 0  Review of Systems  Constitutional: Negative.   Respiratory: Positive for cough.   Cardiovascular: Negative.   Neurological: Positive for dizziness and headaches.  Psychiatric/Behavioral: Positive for agitation. Negative for decreased concentration, dysphoric mood, self-injury, sleep disturbance and suicidal ideas. The patient is nervous/anxious.     Social History  Substance Use Topics  . Smoking status: Never Smoker  .  Smokeless tobacco: Never Used  . Alcohol use No   Objective:   BP 130/84 (BP Location: Right Arm, Patient Position: Sitting, Cuff Size: Normal)   Pulse 86   Temp 98.6 F (37 C) (Oral)   Resp 16   Wt 153 lb (69.4 kg)   SpO2 99%   BMI 27.10 kg/m  Vitals:   10/26/16 1339  BP: 130/84  Pulse: 86  Resp: 16  Temp: 98.6 F (37 C)  TempSrc: Oral  SpO2: 99%  Weight: 153 lb (69.4 kg)     Physical Exam  Constitutional: She appears well-developed and well-nourished. No distress.  Neck: Normal range of motion. Neck supple. No JVD present. No tracheal deviation present. No thyromegaly present.  Cardiovascular: Normal rate, regular rhythm and normal heart sounds.  Exam reveals no gallop and no friction rub.   No murmur heard. Pulmonary/Chest: Effort normal and breath sounds normal. No respiratory distress. She has no wheezes. She has no rales.  Musculoskeletal: She exhibits no edema.  Lymphadenopathy:    She has no cervical adenopathy.  Skin: She is not diaphoretic.  Psychiatric: Her speech is normal and behavior is normal. Judgment and thought content normal. Her mood appears anxious. Cognition and memory are normal.  Vitals reviewed.      Assessment & Plan:     1. Acute maxillary sinusitis, recurrence not specified Worsening symptoms that have not responded to OTC medications. Will give augmentin as below. Continue allergy medications. Stay well hydrated and get plenty of rest. Call if no symptom improvement or if symptoms worsen. - amoxicillin-clavulanate (AUGMENTIN) 875-125 MG tablet; Take 1 tablet by mouth 2 (two) times daily.  Dispense: 20 tablet; Refill: 0  2. Essential hypertension BP is stable. Continue amlodipine 5mg .   3. Anxiety Worsening due to stressors at work. Continue xanax prn during the day and one at bedtime to help sleep. She is to call if work causes worsening symptoms.  - ALPRAZolam (XANAX) 0.5 MG tablet; TAKE 1 TABLET BY MOUTH EVERY DAY AND AT BEDTIME   Dispense: 60 tablet; Refill: [place], PA-C  [facility]

## 2016-11-23 ENCOUNTER — Other Ambulatory Visit: Payer: Self-pay | Admitting: Physician Assistant

## 2016-11-23 DIAGNOSIS — J302 Other seasonal allergic rhinitis: Secondary | ICD-10-CM

## 2017-01-05 ENCOUNTER — Ambulatory Visit (INDEPENDENT_AMBULATORY_CARE_PROVIDER_SITE_OTHER): Payer: 59 | Admitting: Family Medicine

## 2017-01-05 ENCOUNTER — Encounter: Payer: Self-pay | Admitting: Family Medicine

## 2017-01-05 VITALS — BP 120/78 | HR 71 | Temp 97.9°F | Resp 16 | Wt 152.0 lb

## 2017-01-05 DIAGNOSIS — J0100 Acute maxillary sinusitis, unspecified: Secondary | ICD-10-CM | POA: Diagnosis not present

## 2017-01-05 MED ORDER — AMOXICILLIN-POT CLAVULANATE 875-125 MG PO TABS: 1.0000 | ORAL_TABLET | Freq: Two times a day (BID) | ORAL | 0 refills | Status: DC

## 2017-01-05 NOTE — Patient Instructions (Signed)

## 2017-01-05 NOTE — Progress Notes (Signed)
Patient: Gina [name] Female    DOB: [birth year]   62 y.o.   MRN: [id number] Visit Date: 01/05/2017  Today's Provider: Lavon Paganini, MD   Chief Complaint  Patient presents with  . Sinusitis   Subjective:    Sinusitis  This is a new problem. The current episode started in the past 7 days. The problem has been gradually worsening since onset. Maximum temperature: subjective fever, not taking temperature. She is experiencing no pain. Associated symptoms include chills, diaphoresis, ear pain (right), headaches, a hoarse voice, neck pain, sinus pressure and sneezing. Pertinent negatives include no congestion, coughing, shortness of breath, sore throat or swollen glands. (Also with post nasal drip) Treatments tried: antihistamine, Flonase, Singulair. The treatment provided no relief.  Has been working outside a lot and wonders if this is contributing.  Stayed out of work today due to sinus pressure.  Feels like prior sinusitis.     No Known Allergies   Current Outpatient Prescriptions:  .  ALPRAZolam (XANAX) 0.5 MG tablet, TAKE 1 TABLET BY MOUTH EVERY DAY AND AT BEDTIME, Disp: 60 tablet, Rfl: 5 .  amLODipine (NORVASC) 5 MG tablet, TAKE 1 TABLET BY MOUTH EVERY DAY, Disp: 90 tablet, Rfl: 3 .  B COMPLEX VITAMINS PO, Take 1 tablet by mouth daily., Disp: , Rfl:  .  cetirizine (ZYRTEC ALLERGY) 10 MG tablet, Take 1 tablet by mouth daily., Disp: , Rfl:  .  Cholecalciferol (VITAMIN D3) 1000 UNITS CAPS, Take 1 capsule by mouth daily., Disp: , Rfl:  .  clotrimazole-betamethasone (LOTRISONE) cream, Apply 1 application topically 2 (two) times daily., Disp: 45 g, Rfl: 1 .  fluticasone (FLONASE) 50 MCG/ACT nasal spray, PLACE 2 SPRAYS INTO BOTH NOSTRILS DAILY., Disp: 16 g, Rfl: 5 .  lansoprazole (PREVACID) 15 MG capsule, Take 1 capsule by mouth daily. , Disp: , Rfl:  .  montelukast (SINGULAIR) 10 MG tablet, TAKE 1 TABLET BY MOUTH EVERY DAY, Disp: 90 tablet, Rfl: 3 .  triamcinolone ointment  (KENALOG) 0.1 %, APPLY 1 APPLICATION TOPICALLY 2 TIMES DAILY. MIX WITH NYSTATIN**, Disp: 30 g, Rfl: 0 .  meloxicam (MOBIC) 15 MG tablet, TAKE 1 TABLET (15 MG TOTAL) BY MOUTH DAILY. (Patient not taking: Reported on 01/05/2017), Disp: 30 tablet, Rfl: 5  Review of Systems  Constitutional: Positive for chills and diaphoresis.  HENT: Positive for ear pain (right), hoarse voice, sinus pressure and sneezing. Negative for congestion and sore throat.   Eyes: Negative.   Respiratory: Negative for cough and shortness of breath.   Cardiovascular: Negative.   Gastrointestinal: Negative.   Musculoskeletal: Positive for neck pain.  Neurological: Positive for headaches. Negative for dizziness and light-headedness.  Psychiatric/Behavioral: Negative.     Social History  Substance Use Topics  . Smoking status: Never Smoker  . Smokeless tobacco: Never Used  . Alcohol use No   Objective:   BP 120/78 (BP Location: Left Arm, Patient Position: Sitting, Cuff Size: Normal)   Pulse 71   Temp 97.9 F (36.6 C) (Oral)   Resp 16   Wt 152 lb (68.9 kg)   BMI 26.93 kg/m  Vitals:   01/05/17 1608  BP: 120/78  Pulse: 71  Resp: 16  Temp: 97.9 F (36.6 C)  TempSrc: Oral  Weight: 152 lb (68.9 kg)     Physical Exam  Constitutional: She appears well-developed and well-nourished. No distress.  HENT:  Head: Normocephalic and atraumatic.  Right Ear: Tympanic membrane, external ear and ear canal normal.  Left Ear: Tympanic  membrane, external ear and ear canal normal.  Nose: Right sinus exhibits maxillary sinus tenderness. Left sinus exhibits maxillary sinus tenderness.  Mouth/Throat: Oropharynx is clear and moist. No oropharyngeal exudate.  Eyes: Pupils are equal, round, and reactive to light. Conjunctivae are normal. No scleral icterus.  Neck: Neck supple. No thyromegaly present.  Cardiovascular: Normal rate, regular rhythm, normal heart sounds and intact distal pulses.   No murmur heard. Pulmonary/Chest:  Effort normal and breath sounds normal. No respiratory distress. She has no wheezes. She has no rales.  Musculoskeletal: She exhibits no edema or deformity.  Lymphadenopathy:    She has no cervical adenopathy.  Neurological: She is alert.  Skin: Skin is warm and dry. No rash noted.  Psychiatric: She has a normal mood and affect. Her behavior is normal.  Vitals reviewed.     Assessment & Plan:     1. Acute maxillary sinusitis, recurrence not specified - exam and history c/w sinusitis - advised maximizing antihistamine and allergy treatment - work note given - return precautions discussed - amoxicillin-clavulanate (AUGMENTIN) 875-125 MG tablet; Take 1 tablet by mouth 2 (two) times daily.  Dispense: 20 tablet; Refill: 0      The entirety of the information documented in the History of Present Illness, Review of Systems and Physical Exam were personally obtained by me. Portions of this information were initially documented by Raquel Sarna Ratchford, CMA and reviewed by me for thoroughness and accuracy.    Lavon Paganini, MD  [facility]

## 2017-01-15 ENCOUNTER — Other Ambulatory Visit: Payer: Self-pay | Admitting: Physician Assistant

## 2017-01-15 ENCOUNTER — Telehealth: Payer: Self-pay | Admitting: Family Medicine

## 2017-01-15 MED ORDER — FLUCONAZOLE 150 MG PO TABS: 150.0000 mg | ORAL_TABLET | Freq: Once | ORAL | 0 refills | Status: AC

## 2017-01-15 NOTE — Telephone Encounter (Signed)
Pt advised.

## 2017-01-15 NOTE — Telephone Encounter (Signed)
Was prescribed Augmentin 01/05/2017 for sinusitis. Please review.

## 2017-01-15 NOTE — Telephone Encounter (Signed)
Pt would like a rx for diflucan.  She has gotten a yeast infection from the antibiotic she has been taking .  She uses [facility]  [place].  Pt's call back [phone] thanks teri

## 2017-01-15 NOTE — Telephone Encounter (Signed)
Diflucan sent to pharmacy.  Please advise patient to be sure to finish entire antibiotic course before taking diflucan (she should have already finished it any ways).  Virginia Crews, MD, MPH [facility] 01/15/2017 12:05 PM

## 2017-01-29 ENCOUNTER — Telehealth: Payer: Self-pay | Admitting: Family Medicine

## 2017-01-31 ENCOUNTER — Ambulatory Visit (INDEPENDENT_AMBULATORY_CARE_PROVIDER_SITE_OTHER): Payer: 59 | Admitting: Physician Assistant

## 2017-01-31 ENCOUNTER — Encounter: Payer: Self-pay | Admitting: Physician Assistant

## 2017-01-31 VITALS — BP 140/72 | HR 78 | Temp 98.2°F | Resp 16 | Wt 152.4 lb

## 2017-01-31 DIAGNOSIS — Z23 Encounter for immunization: Secondary | ICD-10-CM | POA: Diagnosis not present

## 2017-01-31 DIAGNOSIS — M7989 Other specified soft tissue disorders: Secondary | ICD-10-CM

## 2017-01-31 DIAGNOSIS — N750 Cyst of Bartholin's gland: Secondary | ICD-10-CM | POA: Diagnosis not present

## 2017-01-31 MED ORDER — DOXYCYCLINE HYCLATE 100 MG PO TABS: 100.0000 mg | ORAL_TABLET | Freq: Two times a day (BID) | ORAL | 0 refills | Status: DC

## 2017-01-31 NOTE — Patient Instructions (Signed)
Bartholin Cyst or Abscess A Bartholin cyst is a fluid-filled sac that forms on a Bartholin gland. Bartholin glands are small glands that are located within the folds of skin (labia) along the sides of the lower opening of the vagina. These glands produce a fluid to moisten the outside of the vagina during sexual intercourse. A Bartholin cyst causes a bulge on the side of the vagina. A cyst that is not large or infected may not cause symptoms or problems. However, if the fluid within the cyst becomes infected, the cyst can turn into an abscess. An abscess may cause discomfort or pain. What are the causes? A Bartholin cyst may develop when the duct of the gland becomes blocked. In many cases, the cause of this is not known. Various kinds of bacteria can cause the cyst to become infected and develop into an abscess. What increases the risk? You may be at an increased risk of developing a Bartholin cyst or abscess if:  You are a woman of reproductive age.  You have a history of previous Bartholin cysts or abscesses.  You have diabetes.  You have a sexually transmitted disease (STD).  What are the signs or symptoms? The severity of symptoms varies depending on the size of the cyst and whether it is infected. Symptoms may include:  A bulge or swelling near the lower opening of your vagina.  Discomfort or pain.  Redness.  Pain during sexual intercourse.  Pain when walking.  Fluid draining from the area.  How is this diagnosed? Your health care provider may make a diagnosis based on your symptoms and a physical exam. He or she will look for swelling in your vaginal area. Blood tests may be done to check for infections. A sample of fluid from the cyst or abscess may also be taken to be tested in a lab. How is this treated? Small cysts that are not infected may not require any treatment. These often go away on their own. Yourhealth care provider will recommend hot baths and the use of warm  compresses. These may also be part of the treatment for an abscess. Treatment options for a large cyst or abscess may include:  Antibiotic medicine.  A surgical procedure to drain the abscess. One of the following procedures may be done: ? Incision and drainage. An incision is made in the cyst or abscess so that the fluid drains out. A catheter may be placed inside the cyst so that it does not close and fill up with fluid again. The catheter will be removed after you have a follow-up visit with a specialist (gynecologist). ? Marsupialization. The cyst or abscess is opened and kept open by stitching the edges of the skin to the walls of the cyst or abscess. This allows it to continue to drain and not fill up with fluid again.  If you have cysts or abscesses that keep returning and have required incision and drainage multiple times, your health care provider may talk to you about surgery to remove the Bartholin gland. Follow these instructions at home:  Take medicines only as directed by your health care provider.  If you were prescribed an antibiotic medicine, finish it all even if you start to feel better.  Apply warm, wet compresses to the area or take warm, shallow baths that cover your pelvic region (sitz baths) several times a day or as directed by your health care provider.  Do not squeeze the cyst or apply heavy pressure to it.    Do not have sexual intercourse until the cyst has gone away.  If your cyst or abscess was opened, a small piece of gauze or a drain may have been placed in the area to allow drainage. Do not remove the gauze or the drain until directed by your health care provider.  Wear feminine pads-not tampons-as needed for any drainage or bleeding.  Keep all follow-up visits as directed by your health care provider. This is important. How is this prevented? Take these steps to help prevent a Bartholin cyst from returning:  Practice good hygiene.  Clean your vaginal  area with mild soap and a soft cloth when you bathe.  Practice safe sex to prevent STDs.  Contact a health care provider if:  You have increased pain, swelling, or redness in the area of the cyst.  Puslike drainage is coming from the cyst.  You have a fever. This information is not intended to replace advice given to you by your health care provider. Make sure you discuss any questions you have with your health care provider. Document Released: 04/24/2005 Document Revised: 09/30/2015 Document Reviewed: 12/08/2013 Elsevier Interactive Patient Education  2018 Elsevier Inc.  

## 2017-01-31 NOTE — Progress Notes (Signed)
Patient: Gina [name] Female    DOB: Jan 15, 1956   61 y.o.   MRN: [id number] Visit Date: 01/31/2017  Today's Provider: Mar Daring, PA-C   No chief complaint on file.  Subjective:    HPI Patient is here today with c/o bump on labia. Per patient she has been putting warmed compress. She reports that she had this last year and was treated with Doxy. She reports this has worked well in the past. She has not had a recurrence since December of 2017 with conservative management.  She also reports an incident of bilateral leg swelling that happened on Saturday. She does report she had been very active outside and working in her yard. Her daughter noticed her legs were swollen and pointed it out to her. She does report that they improved over night with elevation. She does report that her legs did throb some that night but this has also improved.       No Known Allergies   Current Outpatient Prescriptions:  .  ALPRAZolam (XANAX) 0.5 MG tablet, TAKE 1 TABLET BY MOUTH EVERY DAY AND AT BEDTIME, Disp: 60 tablet, Rfl: 5 .  amLODipine (NORVASC) 5 MG tablet, TAKE 1 TABLET BY MOUTH EVERY DAY, Disp: 90 tablet, Rfl: 3 .  B COMPLEX VITAMINS PO, Take 1 tablet by mouth daily., Disp: , Rfl:  .  cetirizine (ZYRTEC ALLERGY) 10 MG tablet, Take 1 tablet by mouth daily., Disp: , Rfl:  .  Cholecalciferol (VITAMIN D3) 1000 UNITS CAPS, Take 1 capsule by mouth daily., Disp: , Rfl:  .  clotrimazole-betamethasone (LOTRISONE) cream, Apply 1 application topically 2 (two) times daily., Disp: 45 g, Rfl: 1 .  fluticasone (FLONASE) 50 MCG/ACT nasal spray, PLACE 2 SPRAYS INTO BOTH NOSTRILS DAILY., Disp: 16 g, Rfl: 5 .  lansoprazole (PREVACID) 15 MG capsule, Take 1 capsule by mouth daily. , Disp: , Rfl:  .  montelukast (SINGULAIR) 10 MG tablet, TAKE 1 TABLET BY MOUTH EVERY DAY, Disp: 90 tablet, Rfl: 3 .  triamcinolone ointment (KENALOG) 0.1 %, APPLY 1 APPLICATION TOPICALLY 2 TIMES DAILY. MIX WITH NYSTATIN**,  Disp: 30 g, Rfl: 0 .  amoxicillin-clavulanate (AUGMENTIN) 875-125 MG tablet, Take 1 tablet by mouth 2 (two) times daily. (Patient not taking: Reported on 01/31/2017), Disp: 20 tablet, Rfl: 0 .  meloxicam (MOBIC) 15 MG tablet, TAKE 1 TABLET (15 MG TOTAL) BY MOUTH DAILY. (Patient not taking: Reported on 01/05/2017), Disp: 30 tablet, Rfl: 5  Review of Systems  Constitutional: Negative.   Respiratory: Negative.   Cardiovascular: Positive for leg swelling. Negative for chest pain and palpitations.  Gastrointestinal: Negative.   Musculoskeletal: Negative.   Skin: Positive for wound (vaginal labia).  Neurological: Negative.     Social History  Substance Use Topics  . Smoking status: Never Smoker  . Smokeless tobacco: Never Used  . Alcohol use No   Objective:   BP 140/72 (BP Location: Left Arm, Patient Position: Sitting, Cuff Size: Large)   Pulse 78   Temp 98.2 F (36.8 C) (Oral)   Resp 16   Wt 152 lb 6.4 oz (69.1 kg)   BMI 27.00 kg/m     Physical Exam  Constitutional: She appears well-developed and well-nourished. No distress.  Neck: Normal range of motion. Neck supple. No JVD present. No tracheal deviation present. No thyromegaly present.  Cardiovascular: Normal rate, regular rhythm and normal heart sounds.  Exam reveals no gallop and no friction rub.   No murmur heard. Pulmonary/Chest: Effort  normal and breath sounds normal. No respiratory distress. She has no wheezes. She has no rales.  Genitourinary:     Musculoskeletal: She exhibits edema (trace pitting edema bilaterally).  Lymphadenopathy:    She has no cervical adenopathy.  Skin: She is not diaphoretic.  Vitals reviewed.       Assessment & Plan:     1. Bartholin cyst Will treat with doxycycline as below. Advised to continue warm compresses and epsom salt soaks. Call if symptoms worsen.  - doxycycline (VIBRA-TABS) 100 MG tablet; Take 1 tablet (100 mg total) by mouth 2 (two) times daily.  Dispense: 14 tablet;  Refill: 0  2. Leg swelling Improved with rest and elevation. Discussed also may be caused by amlodipine. Discussed compression stockings when she is going to be up and active. Elevate legs when she can at rest. Call if it worsens or does not go down with overnight elevation. Patient is also due for CPE. Will call to schedule so she can get labs rechecked.   3. Influenza vaccine needed Flu vaccine given today without complication. Patient sat upright for 15 minutes to check for adverse reaction before being released. - Flu Vaccine QUAD 36+ mos IM       Mar Daring, PA-C  [facility]

## 2017-02-02 ENCOUNTER — Other Ambulatory Visit: Payer: Self-pay | Admitting: Physician Assistant

## 2017-02-02 DIAGNOSIS — L409 Psoriasis, unspecified: Secondary | ICD-10-CM

## 2017-02-06 ENCOUNTER — Telehealth: Payer: Self-pay | Admitting: Physician Assistant

## 2017-02-06 DIAGNOSIS — L409 Psoriasis, unspecified: Secondary | ICD-10-CM

## 2017-02-06 MED ORDER — PREDNISONE 10 MG (21) PO TBPK: ORAL_TABLET | ORAL | 0 refills | Status: DC

## 2017-02-06 NOTE — Telephone Encounter (Signed)
Patient called stating that she has been doing the epsom salt baths and the Bartholin cyst has began to subside. She has been using the Kenalog cream that was prescribed for the rash on her buttocks. Patient wanted to let you know that the rash on her buttocks has worsened. It has spread and is red. Patient denies any itching or pain from the rash. Patient thinks the rash is her Psoriasis. She states it is the same type of rash that she showed to you on her leg. Patient is concerned and wants to know what she should do. Could the rash be aggravated by the Epsom salt baths? Patient states she cannot come back in for an appointment because her insurance will notcover another visit right now. Patients call back is [phone].

## 2017-02-06 NOTE — Telephone Encounter (Signed)
Yes it could be getting dried out from the epsom salt soaks. I will send in prednisone taper to see if that helps it.

## 2017-02-06 NOTE — Telephone Encounter (Signed)
Pt wants to speak with nurse or provider.  States her rash she saw Tawanna Sat about is somewhere else and she has some concerns.  States she just started the cream.

## 2017-02-07 NOTE — Telephone Encounter (Signed)
LMTCB  Thanks,  -Joseline 

## 2017-02-08 NOTE — Telephone Encounter (Signed)
Patient advised as directed below. She started the Prednisone yesterday. She reports the cyst is much better but that she is concern about some red bumps on her butt.She reports it looks different from her psoriasis. Bumps are not painful, itching, burning or any drainage. Offered patient to come in so that we can take a look at the bumps and reports she prefers to finish the prednisone and if no change she will call for an appointment.  Thanks,  -Evo Aderman

## 2017-02-08 NOTE — Telephone Encounter (Signed)
Noted  

## 2017-04-25 ENCOUNTER — Other Ambulatory Visit: Payer: Self-pay | Admitting: Physician Assistant

## 2017-04-25 DIAGNOSIS — J302 Other seasonal allergic rhinitis: Secondary | ICD-10-CM

## 2017-04-25 DIAGNOSIS — I10 Essential (primary) hypertension: Secondary | ICD-10-CM

## 2017-04-25 DIAGNOSIS — F419 Anxiety disorder, unspecified: Secondary | ICD-10-CM

## 2017-04-26 NOTE — Telephone Encounter (Signed)
Prescription was called in

## 2017-05-02 ENCOUNTER — Other Ambulatory Visit: Payer: Self-pay | Admitting: Physician Assistant

## 2017-05-02 DIAGNOSIS — L409 Psoriasis, unspecified: Secondary | ICD-10-CM

## 2017-05-08 DIAGNOSIS — S060XAA Concussion with loss of consciousness status unknown, initial encounter: Secondary | ICD-10-CM

## 2017-05-08 HISTORY — DX: Concussion with loss of consciousness status unknown, initial encounter: S06.0XAA

## 2017-06-18 ENCOUNTER — Encounter: Payer: Self-pay | Admitting: Physician Assistant

## 2017-06-18 ENCOUNTER — Ambulatory Visit (INDEPENDENT_AMBULATORY_CARE_PROVIDER_SITE_OTHER): Payer: BLUE CROSS/BLUE SHIELD | Admitting: Physician Assistant

## 2017-06-18 VITALS — BP 116/80 | HR 80 | Temp 98.4°F | Resp 16 | Wt 151.0 lb

## 2017-06-18 DIAGNOSIS — R05 Cough: Secondary | ICD-10-CM | POA: Diagnosis not present

## 2017-06-18 DIAGNOSIS — J0140 Acute pansinusitis, unspecified: Secondary | ICD-10-CM | POA: Diagnosis not present

## 2017-06-18 DIAGNOSIS — R059 Cough, unspecified: Secondary | ICD-10-CM

## 2017-06-18 MED ORDER — BENZONATATE 200 MG PO CAPS: 200.0000 mg | ORAL_CAPSULE | Freq: Three times a day (TID) | ORAL | 0 refills | Status: DC | PRN

## 2017-06-18 MED ORDER — AMOXICILLIN-POT CLAVULANATE 875-125 MG PO TABS
1.0000 | ORAL_TABLET | Freq: Two times a day (BID) | ORAL | 0 refills | Status: DC
Start: 2017-06-18 — End: 2017-06-27

## 2017-06-18 NOTE — Progress Notes (Signed)
Patient: Gina [name] Female    DOB: 05/01/1956   62 y.o.   MRN: [id number] Visit Date: 06/18/2017  Today's Provider: Mar Daring, PA-C   Chief Complaint  Patient presents with  . URI   Subjective:    HPI Upper Respiratory Infection: Patient complains of symptoms of a URI, possible sinusitis. Symptoms include irritability and chills. Onset of symptoms was 4 days ago, gradually worsening since that time. She also c/o bilateral ear pressure/pain, congestion, cough described as nonproductive and productive cough with  clear colored sputum for the past 2 days .  She is drinking plenty of fluids. Evaluation to date: none. Treatment to date: antihistamines and decongestants.     No Known Allergies   Current Outpatient Medications:  .  ALPRAZolam (XANAX) 0.5 MG tablet, TAKE 1 TABLET BY MOUTH EVERY DAY & 1 TABLET AT BEDTIME, Disp: 180 tablet, Rfl: 1 .  amLODipine (NORVASC) 5 MG tablet, TAKE 1 TABLET BY MOUTH EVERY DAY, Disp: 90 tablet, Rfl: 3 .  B COMPLEX VITAMINS PO, Take 1 tablet by mouth daily., Disp: , Rfl:  .  cetirizine (ZYRTEC ALLERGY) 10 MG tablet, Take 1 tablet by mouth daily., Disp: , Rfl:  .  Cholecalciferol (VITAMIN D3) 1000 UNITS CAPS, Take 1 capsule by mouth daily., Disp: , Rfl:  .  clotrimazole-betamethasone (LOTRISONE) cream, Apply 1 application topically 2 (two) times daily., Disp: 45 g, Rfl: 1 .  fluticasone (FLONASE) 50 MCG/ACT nasal spray, SPRAY 2 SPRAYS INTO EACH NOSTRIL EVERY DAY, Disp: 48 g, Rfl: 1 .  lansoprazole (PREVACID) 15 MG capsule, Take 1 capsule by mouth daily. , Disp: , Rfl:  .  montelukast (SINGULAIR) 10 MG tablet, TAKE 1 TABLET BY MOUTH EVERY DAY, Disp: 90 tablet, Rfl: 3 .  triamcinolone cream (KENALOG) 0.1 %, APPLY 1 APPLICATION TOPICALLY 2 (TWO) TIMES DAILY., Disp: 80 g, Rfl: 0  Review of Systems  Constitutional: Positive for activity change, chills and fatigue.  HENT: Positive for congestion, ear pain, postnasal drip, sinus pressure  and sinus pain.   Respiratory: Positive for cough and wheezing.   Cardiovascular: Negative.   Gastrointestinal: Negative.   Neurological: Negative.     Social History   Tobacco Use  . Smoking status: Never Smoker  . Smokeless tobacco: Never Used  Substance Use Topics  . Alcohol use: No    Alcohol/week: 0.0 oz   Objective:   BP 116/80 (BP Location: Left Arm, Patient Position: Sitting, Cuff Size: Normal)   Pulse 80   Temp 98.4 F (36.9 C) (Oral)   Resp 16   Wt 151 lb (68.5 kg)   SpO2 99%   BMI 26.75 kg/m  Vitals:   06/18/17 1622  BP: 116/80  Pulse: 80  Resp: 16  Temp: 98.4 F (36.9 C)  TempSrc: Oral  SpO2: 99%  Weight: 151 lb (68.5 kg)     Physical Exam  Constitutional: She appears well-developed and well-nourished. No distress.  HENT:  Head: Normocephalic and atraumatic.  Right Ear: Hearing, tympanic membrane, external ear and ear canal normal.  Left Ear: Hearing, tympanic membrane, external ear and ear canal normal.  Nose: Right sinus exhibits maxillary sinus tenderness and frontal sinus tenderness. Left sinus exhibits maxillary sinus tenderness and frontal sinus tenderness.  Mouth/Throat: Uvula is midline, oropharynx is clear and moist and mucous membranes are normal. No oropharyngeal exudate.  Neck: Normal range of motion. Neck supple. No tracheal deviation present. No thyromegaly present.  Cardiovascular: Normal rate, regular rhythm  and normal heart sounds. Exam reveals no gallop and no friction rub.  No murmur heard. Pulmonary/Chest: Effort normal and breath sounds normal. No stridor. No respiratory distress. She has no wheezes. She has no rales.  Lymphadenopathy:    She has no cervical adenopathy.  Skin: She is not diaphoretic.  Vitals reviewed.      Assessment & Plan:     1. Acute pansinusitis, recurrence not specified Worsening symptoms that have not responded to OTC medications. Will give augmentin as below. Continue allergy medications. Stay well  hydrated and get plenty of rest. Call if no symptom improvement or if symptoms worsen. - amoxicillin-clavulanate (AUGMENTIN) 875-125 MG tablet; Take 1 tablet by mouth 2 (two) times daily.  Dispense: 20 tablet; Refill: 0  2. Cough Tessalon perles given for cough. Push fluids.  - benzonatate (TESSALON) 200 MG capsule; Take 1 capsule (200 mg total) by mouth 3 (three) times daily as needed for cough.  Dispense: 30 capsule; Refill: 0       Mar Daring, PA-C  [facility]

## 2017-06-27 ENCOUNTER — Ambulatory Visit (INDEPENDENT_AMBULATORY_CARE_PROVIDER_SITE_OTHER): Payer: BLUE CROSS/BLUE SHIELD | Admitting: Physician Assistant

## 2017-06-27 ENCOUNTER — Encounter: Payer: Self-pay | Admitting: Physician Assistant

## 2017-06-27 VITALS — BP 110/80 | HR 60 | Temp 97.8°F | Resp 16 | Ht 63.0 in | Wt 150.0 lb

## 2017-06-27 DIAGNOSIS — E782 Mixed hyperlipidemia: Secondary | ICD-10-CM | POA: Diagnosis not present

## 2017-06-27 DIAGNOSIS — Z1239 Encounter for other screening for malignant neoplasm of breast: Secondary | ICD-10-CM

## 2017-06-27 DIAGNOSIS — B379 Candidiasis, unspecified: Secondary | ICD-10-CM | POA: Diagnosis not present

## 2017-06-27 DIAGNOSIS — R51 Headache: Secondary | ICD-10-CM | POA: Diagnosis not present

## 2017-06-27 DIAGNOSIS — H6121 Impacted cerumen, right ear: Secondary | ICD-10-CM | POA: Diagnosis not present

## 2017-06-27 DIAGNOSIS — R1310 Dysphagia, unspecified: Secondary | ICD-10-CM | POA: Diagnosis not present

## 2017-06-27 DIAGNOSIS — E559 Vitamin D deficiency, unspecified: Secondary | ICD-10-CM | POA: Diagnosis not present

## 2017-06-27 DIAGNOSIS — T3695XA Adverse effect of unspecified systemic antibiotic, initial encounter: Secondary | ICD-10-CM

## 2017-06-27 DIAGNOSIS — Z124 Encounter for screening for malignant neoplasm of cervix: Secondary | ICD-10-CM | POA: Diagnosis not present

## 2017-06-27 DIAGNOSIS — Z0001 Encounter for general adult medical examination with abnormal findings: Secondary | ICD-10-CM

## 2017-06-27 DIAGNOSIS — B372 Candidiasis of skin and nail: Secondary | ICD-10-CM

## 2017-06-27 DIAGNOSIS — F419 Anxiety disorder, unspecified: Secondary | ICD-10-CM

## 2017-06-27 DIAGNOSIS — R195 Other fecal abnormalities: Secondary | ICD-10-CM

## 2017-06-27 DIAGNOSIS — Z0000 Encounter for general adult medical examination without abnormal findings: Secondary | ICD-10-CM

## 2017-06-27 DIAGNOSIS — Z1212 Encounter for screening for malignant neoplasm of rectum: Secondary | ICD-10-CM

## 2017-06-27 DIAGNOSIS — I10 Essential (primary) hypertension: Secondary | ICD-10-CM

## 2017-06-27 DIAGNOSIS — R519 Headache, unspecified: Secondary | ICD-10-CM

## 2017-06-27 DIAGNOSIS — Z1211 Encounter for screening for malignant neoplasm of colon: Secondary | ICD-10-CM

## 2017-06-27 MED ORDER — FLUCONAZOLE 150 MG PO TABS
150.0000 mg | ORAL_TABLET | Freq: Once | ORAL | 0 refills | Status: AC
Start: 2017-06-27 — End: 2017-06-27

## 2017-06-27 MED ORDER — NYSTATIN 100000 UNIT/GM EX CREA: 1.0000 "application " | TOPICAL_CREAM | Freq: Two times a day (BID) | CUTANEOUS | 0 refills | Status: DC

## 2017-06-27 NOTE — Patient Instructions (Signed)

## 2017-06-27 NOTE — Progress Notes (Signed)
Patient: Gina [name], Female    DOB: 1956-01-14, 62 y.o.   MRN: [id number] Visit Date: 06/27/2017  Today's Provider: Mar Daring, PA-C   Chief Complaint  Patient presents with  . Annual Exam   Subjective:    Annual physical exam Gina [name] is a 62 y.o. female who presents today for health maintenance and complete physical. She feels fairly well. She reports exercising active with daily activities. She reports she is sleeping fairly well.  05/22/16 CPE 12/01/14 Pap-neg 07/11/16 Mammogram-BI-RADS 1 05/11/03 Colonoscopy-diverticulosis -----------------------------------------------------------------   Review of Systems  Constitutional: Negative.   HENT: Positive for sinus pressure.   Eyes: Positive for redness.  Respiratory: Negative.   Cardiovascular: Negative.   Gastrointestinal: Negative.   Endocrine: Negative.   Genitourinary: Negative.   Musculoskeletal: Negative.   Skin: Positive for rash.  Allergic/Immunologic: Negative.   Neurological: Positive for headaches.  Hematological: Negative.   Psychiatric/Behavioral: Negative.     Social History      She  reports that  has never smoked. she has never used smokeless tobacco. She reports that she does not drink alcohol or use drugs.       Social History   Socioeconomic History  . Marital status: Married    Spouse name: None  . Number of children: None  . Years of education: None  . Highest education level: None  Social Needs  . Financial resource strain: None  . Food insecurity - worry: None  . Food insecurity - inability: None  . Transportation needs - medical: None  . Transportation needs - non-medical: None  Occupational History  . None  Tobacco Use  . Smoking status: Never Smoker  . Smokeless tobacco: Never Used  Substance and Sexual Activity  . Alcohol use: No    Alcohol/week: 0.0 oz  . Drug use: No  . Sexual activity: None  Other Topics Concern  . None  Social History  Narrative  . None    No past medical history on file.   Patient Active Problem List   Diagnosis Date Noted  . Anxiety 12/01/2014  . Mucous polyp of cervix 12/01/2014  . Diverticulosis of colon 12/01/2014  . Acid reflux 12/01/2014  . HCV antibody positive 12/01/2014  . BP (high blood pressure) 12/01/2014  . Irritable colon 12/01/2014  . Presence of intrauterine contraceptive device 12/01/2014  . Migraine 12/01/2014  . Psoriasis 12/01/2014  . Avitaminosis D 12/01/2014    Past Surgical History:  Procedure Laterality Date  . DILATION AND CURETTAGE OF UTERUS  1998  . FOOT SURGERY     08/2007    Family History        Family Status  Relation Name Status  . Mother  Deceased  . Father  Deceased  . Sister  Alive  . Brother  Deceased       throat  . MGF  Deceased  . PGF  Deceased  . MGM  Deceased  . PGM  Deceased  . Ethlyn Daniels  Deceased  . Brother  Alive        Her family history includes Alzheimer's disease in her paternal grandfather; Arthritis in her mother and sister; Breast cancer in her paternal aunt; COPD in her mother; Cancer in her brother; Coronary artery disease in her father; Emphysema in her mother; Heart attack in her maternal grandfather; Heart disease in her father; Hypertension in her father; Throat cancer in her brother.      No Known Allergies  Current Outpatient Medications:  .  ALPRAZolam (XANAX) 0.5 MG tablet, TAKE 1 TABLET BY MOUTH EVERY DAY & 1 TABLET AT BEDTIME, Disp: 180 tablet, Rfl: 1 .  amLODipine (NORVASC) 5 MG tablet, TAKE 1 TABLET BY MOUTH EVERY DAY, Disp: 90 tablet, Rfl: 3 .  B COMPLEX VITAMINS PO, Take 1 tablet by mouth daily., Disp: , Rfl:  .  benzonatate (TESSALON) 200 MG capsule, Take 1 capsule (200 mg total) by mouth 3 (three) times daily as needed for cough., Disp: 30 capsule, Rfl: 0 .  cetirizine (ZYRTEC ALLERGY) 10 MG tablet, Take 1 tablet by mouth daily., Disp: , Rfl:  .  Cholecalciferol (VITAMIN D3) 1000 UNITS CAPS, Take 1 capsule  by mouth daily., Disp: , Rfl:  .  clotrimazole-betamethasone (LOTRISONE) cream, Apply 1 application topically 2 (two) times daily., Disp: 45 g, Rfl: 1 .  fluticasone (FLONASE) 50 MCG/ACT nasal spray, SPRAY 2 SPRAYS INTO EACH NOSTRIL EVERY DAY, Disp: 48 g, Rfl: 1 .  lansoprazole (PREVACID) 15 MG capsule, Take 1 capsule by mouth daily. , Disp: , Rfl:  .  montelukast (SINGULAIR) 10 MG tablet, TAKE 1 TABLET BY MOUTH EVERY DAY, Disp: 90 tablet, Rfl: 3 .  triamcinolone cream (KENALOG) 0.1 %, APPLY 1 APPLICATION TOPICALLY 2 (TWO) TIMES DAILY., Disp: 80 g, Rfl: 0   Patient Care Team: Chrismon, Vickki Muff, PA as PCP - General (Physician Assistant)      Objective:   Vitals: BP 110/80 (BP Location: Left Arm, Patient Position: Sitting, Cuff Size: Normal)   Pulse 60   Temp 97.8 F (36.6 C) (Oral)   Resp 16   Ht 5\' 3"  (1.6 m)   Wt 150 lb (68 kg)   BMI 26.57 kg/m    Vitals:   06/27/17 1415  BP: 110/80  Pulse: 60  Resp: 16  Temp: 97.8 F (36.6 C)  TempSrc: Oral  Weight: 150 lb (68 kg)  Height: 5\' 3"  (1.6 m)     Physical Exam  Constitutional: She is oriented to person, place, and time. She appears well-developed and well-nourished. No distress.  HENT:  Head: Normocephalic and atraumatic.  Right Ear: Hearing, tympanic membrane, external ear and ear canal normal.  Left Ear: Hearing, tympanic membrane, external ear and ear canal normal.  Nose: Nose normal.  Mouth/Throat: Uvula is midline, oropharynx is clear and moist and mucous membranes are normal. No oropharyngeal exudate.  Eyes: Conjunctivae and EOM are normal. Pupils are equal, round, and reactive to light. Right eye exhibits no discharge. Left eye exhibits no discharge. No scleral icterus.  Neck: Normal range of motion. Neck supple. No JVD present. Carotid bruit is not present. No tracheal deviation present. No thyromegaly present.  Cardiovascular: Normal rate, regular rhythm, normal heart sounds and intact distal pulses. Exam reveals no  gallop and no friction rub.  No murmur heard. Pulmonary/Chest: Effort normal and breath sounds normal. No respiratory distress. She has no wheezes. She has no rales. She exhibits no tenderness. Right breast exhibits no inverted nipple, no mass, no nipple discharge, no skin change and no tenderness. Left breast exhibits no inverted nipple, no mass, no nipple discharge, no skin change and no tenderness. Breasts are symmetrical.    Abdominal: Soft. Bowel sounds are normal. She exhibits no distension and no mass. There is no tenderness. There is no rebound and no guarding. Hernia confirmed negative in the right inguinal area and confirmed negative in the left inguinal area.  Genitourinary: Rectum normal and uterus normal. No breast swelling, tenderness, discharge  or bleeding. Pelvic exam was performed with patient supine. There is no rash, tenderness, lesion or injury on the right labia. There is no rash, tenderness, lesion or injury on the left labia. Cervix exhibits no motion tenderness, no discharge and no friability. Right adnexum displays no mass, no tenderness and no fullness. Left adnexum displays no mass, no tenderness and no fullness. There is erythema (c/w vaginal candida secondary to antibiotic use) in the vagina. No tenderness or bleeding in the vagina. No signs of injury around the vagina. No vaginal discharge found.  Musculoskeletal: Normal range of motion. She exhibits no edema or tenderness.  Lymphadenopathy:    She has no cervical adenopathy.       Right: No inguinal adenopathy present.       Left: No inguinal adenopathy present.  Neurological: She is alert and oriented to person, place, and time. She has normal reflexes. No cranial nerve deficit. Coordination normal.  Skin: Skin is warm and dry. No rash noted. She is not diaphoretic.  Psychiatric: She has a normal mood and affect. Her behavior is normal. Judgment and thought content normal.  Vitals reviewed.    Depression Screen PHQ  2/9 Scores 06/27/2017 10/26/2016  PHQ - 2 Score 0 0  PHQ- 9 Score 0 3      Assessment & Plan:     Routine Health Maintenance and Physical Exam  Exercise Activities and Dietary recommendations Goals    None      Immunization History  Administered Date(s) Administered  . Influenza,inj,Quad PF,6+ Mos 01/31/2017  . Tdap 01/20/2009  . Zoster 02/23/2015    Health Maintenance  Topic Date Due  . Hepatitis C Screening  1955-11-02  . HIV Screening  10/01/1972  . COLONOSCOPY  10/02/2007  . PAP SMEAR  11/30/2017  . MAMMOGRAM  07/12/2018  . TETANUS/TDAP  01/21/2019  . INFLUENZA VACCINE  Completed     Discussed health benefits of physical activity, and encouraged her to engage in regular exercise appropriate for her age and condition.    1. Annual physical exam Normal physical exam today. Will check labs as below and f/u pending lab results. If labs are stable and WNL she will not need to have these rechecked for one year at her next annual physical exam. She is to call the office in the meantime if she has any acute issue, questions or concerns. - CBC with Differential/Platelet - Comprehensive metabolic panel  2. Cervical cancer screening Pap collected today. Will send as below and f/u pending results. - Pap IG and HPV (high risk) DNA detection  3. Breast cancer screening Breast exam today was normal. There is no family history of breast cancer. She does perform regular self breast exams. Mammogram was ordered as below. Information for [facility] was given to patient so she may schedule her mammogram at her convenience. - MM DIGITAL SCREENING BILATERAL  4. Essential hypertension Stable. Continue Amlodipine 5mg . Will check labs as below and f/u pending results. - TSH  5. Anxiety Stable on alprazolam 0.5mg . Will check labs as below and f/u pending results. - TSH  6. Avitaminosis D Postmenopausal. H/O Vit D def. Will check labs as below and f/u pending  results.  - VITAMIN D 25 Hydroxy (Vit-D Deficiency, Fractures)  7. Mixed hyperlipidemia Diet controlled. Will check labs as below and f/u pending results. - Lipid Profile  8. Impacted cerumen of right ear Ear lavage attempted but patient unable to tolerate as it caused dizziness. Advised to use Debrox  drops OTC.   9. Acute nonintractable headache, unspecified headache type Possibly from recent sinus infection. Possibly tension type headache as she reports more stress at work as well. Will await lab results. If headache persists will do further work up.   10. Candidal intertrigo Noted under breast bilaterally. Nystatin cream given as below.  - nystatin cream (MYCOSTATIN); Apply 1 application topically 2 (two) times daily.  Dispense: 30 g; Refill: 0  11. Antibiotic-induced yeast infection Antibiotic induced yeast infection from recent augmentin use for sinus infection. Diflucan given as below.  - fluconazole (DIFLUCAN) 150 MG tablet; Take 1 tablet (150 mg total) by mouth once for 1 dose.  Dispense: 1 tablet; Refill: 0  12. Positive fecal occult blood test OC lite was positive today. Will refer to GI as below.  - Ambulatory referral to Gastroenterology  13. Dysphagia, unspecified type Worsening dysphagia over one year. Started as hard foods only. Now involving soft foods also. Liquids are still ok. Will refer to GI for further evaluation of this as well.  - Ambulatory referral to Gastroenterology  --------------------------------------------------------------------    Mar Daring, PA-C  [facility]

## 2017-06-28 LAB — IFOBT (OCCULT BLOOD): IFOBT: POSITIVE

## 2017-06-29 ENCOUNTER — Telehealth: Payer: Self-pay | Admitting: Physician Assistant

## 2017-06-29 DIAGNOSIS — J3489 Other specified disorders of nose and nasal sinuses: Secondary | ICD-10-CM

## 2017-06-29 MED ORDER — MUPIROCIN 2 % EX OINT: 1.0000 "application " | TOPICAL_OINTMENT | Freq: Two times a day (BID) | CUTANEOUS | 0 refills | Status: DC

## 2017-06-29 NOTE — Telephone Encounter (Signed)
Patient was seen for sinus infection 06/27/17 for sinus infect.   She woke up this morning with a sore inside her nose.  Is there anything she can do for that?

## 2017-06-29 NOTE — Telephone Encounter (Signed)
Patient advised as directed below.  Thanks,  -Joseline 

## 2017-06-29 NOTE — Telephone Encounter (Signed)
Please Review

## 2017-06-29 NOTE — Telephone Encounter (Signed)
She can try neosporin first

## 2017-06-29 NOTE — Telephone Encounter (Signed)
Will send in Bactroban

## 2017-06-29 NOTE — Telephone Encounter (Signed)
Per patient she has been doing the Neosporin and came back. She would like to try something different please.  Thanks,  -Duncan Alejandro

## 2017-07-02 ENCOUNTER — Telehealth: Payer: Self-pay | Admitting: Physician Assistant

## 2017-07-02 NOTE — Telephone Encounter (Signed)
Patient states that it was getting better but it is hard on the end wanted to know what to do  CB# [phone]

## 2017-07-02 NOTE — Telephone Encounter (Signed)
Patient advised as below. Patient verbalizes understanding and is in agreement with treatment plan.  

## 2017-07-02 NOTE — Telephone Encounter (Signed)
Warm compresses. Depending on area can do epsom salt soaks as well

## 2017-07-02 NOTE — Telephone Encounter (Signed)
Patient called and states that she has a cyst now

## 2017-07-04 DIAGNOSIS — E559 Vitamin D deficiency, unspecified: Secondary | ICD-10-CM | POA: Diagnosis not present

## 2017-07-04 DIAGNOSIS — I10 Essential (primary) hypertension: Secondary | ICD-10-CM | POA: Diagnosis not present

## 2017-07-04 DIAGNOSIS — E782 Mixed hyperlipidemia: Secondary | ICD-10-CM | POA: Diagnosis not present

## 2017-07-04 DIAGNOSIS — Z0000 Encounter for general adult medical examination without abnormal findings: Secondary | ICD-10-CM | POA: Diagnosis not present

## 2017-07-04 DIAGNOSIS — F419 Anxiety disorder, unspecified: Secondary | ICD-10-CM | POA: Diagnosis not present

## 2017-07-04 LAB — PAP IG AND HPV HIGH-RISK
HPV, HIGH-RISK: NEGATIVE
PAP Smear Comment: 0

## 2017-07-05 ENCOUNTER — Telehealth: Payer: Self-pay

## 2017-07-05 LAB — CBC WITH DIFFERENTIAL/PLATELET
BASOS ABS: 0 10*3/uL (ref 0.0–0.2)
Basos: 1 %
EOS (ABSOLUTE): 0.2 10*3/uL (ref 0.0–0.4)
Eos: 4 %
HEMOGLOBIN: 13.6 g/dL (ref 11.1–15.9)
Hematocrit: 42.4 % (ref 34.0–46.6)
IMMATURE GRANS (ABS): 0 10*3/uL (ref 0.0–0.1)
Immature Granulocytes: 0 %
LYMPHS: 31 %
Lymphocytes Absolute: 1.2 10*3/uL (ref 0.7–3.1)
MCH: 30.4 pg (ref 26.6–33.0)
MCHC: 32.1 g/dL (ref 31.5–35.7)
MCV: 95 fL (ref 79–97)
MONOCYTES: 9 %
Monocytes Absolute: 0.3 10*3/uL (ref 0.1–0.9)
Neutrophils Absolute: 2 10*3/uL (ref 1.4–7.0)
Neutrophils: 55 %
PLATELETS: 293 10*3/uL (ref 150–379)
RBC: 4.48 x10E6/uL (ref 3.77–5.28)
RDW: 14 % (ref 12.3–15.4)
WBC: 3.7 10*3/uL (ref 3.4–10.8)

## 2017-07-05 LAB — TSH: TSH: 5.27 u[IU]/mL — AB (ref 0.450–4.500)

## 2017-07-05 LAB — LIPID PANEL
CHOL/HDL RATIO: 3.1 ratio (ref 0.0–4.4)
Cholesterol, Total: 182 mg/dL (ref 100–199)
HDL: 58 mg/dL (ref >39)
LDL CALC: 103 mg/dL — AB (ref 0–99)
TRIGLYCERIDES: 105 mg/dL (ref 0–149)
VLDL CHOLESTEROL CAL: 21 mg/dL (ref 5–40)

## 2017-07-05 LAB — COMPREHENSIVE METABOLIC PANEL
ALBUMIN: 4.2 g/dL (ref 3.5–5.5)
ALK PHOS: 93 IU/L (ref 39–117)
ALT: 10 IU/L (ref 0–32)
AST: 13 IU/L (ref 0–40)
Albumin/Globulin Ratio: 1.6 (ref 1.2–2.2)
BILIRUBIN TOTAL: 0.3 mg/dL (ref 0.0–1.2)
BUN / CREAT RATIO: 18 (ref 9–23)
BUN: 12 mg/dL (ref 6–24)
CHLORIDE: 105 mmol/L (ref 96–106)
CO2: 25 mmol/L (ref 20–29)
CREATININE: 0.66 mg/dL (ref 0.57–1.00)
Calcium: 9.4 mg/dL (ref 8.7–10.2)
GFR calc Af Amer: 112 mL/min/{1.73_m2} (ref >59)
GFR calc non Af Amer: 97 mL/min/{1.73_m2} (ref >59)
GLUCOSE: 79 mg/dL (ref 65–99)
Globulin, Total: 2.7 g/dL (ref 1.5–4.5)
Potassium: 4.2 mmol/L (ref 3.5–5.2)
Sodium: 145 mmol/L — ABNORMAL HIGH (ref 134–144)
TOTAL PROTEIN: 6.9 g/dL (ref 6.0–8.5)

## 2017-07-05 LAB — VITAMIN D 25 HYDROXY (VIT D DEFICIENCY, FRACTURES): Vit D, 25-Hydroxy: 28.1 ng/mL — ABNORMAL LOW (ref 30.0–100.0)

## 2017-07-05 NOTE — Telephone Encounter (Signed)
-----   Message from Mar Daring, [place] sent at 07/05/2017 12:53 PM EST ----- Vit D is borderline low at 28.1. Normally this can be corrected with OTC Vit D supplementation of 1000-2000 IU daily. If you are already taking this I can send in the high dose Rx strength. Thyroid is borderline high showing it is not working as well. May benefit from low dose levothyroxine if agreeable. These both can cause fatigue and may benefit you. All other labs are normal. Pap unfortunately was unsatisfactory, which means either I did not get enough sample or sometimes outside factors like the lubrication can obscure the view from the lab of the cells. They did however test the HPV testing and it was negative. If agreeable you can come in any day at your convenience for no charge and we can repeat your pap.

## 2017-07-05 NOTE — Telephone Encounter (Signed)
LMTCB  Thanks,  -Turhan Chill 

## 2017-07-05 NOTE — Telephone Encounter (Signed)
Patient advised as directed below.  Thanks,  -Joseline 

## 2017-07-09 ENCOUNTER — Ambulatory Visit (INDEPENDENT_AMBULATORY_CARE_PROVIDER_SITE_OTHER): Payer: BLUE CROSS/BLUE SHIELD | Admitting: Physician Assistant

## 2017-07-09 ENCOUNTER — Encounter: Payer: Self-pay | Admitting: Physician Assistant

## 2017-07-09 VITALS — BP 118/70 | HR 73 | Temp 97.9°F | Resp 16 | Wt 150.4 lb

## 2017-07-09 DIAGNOSIS — R7989 Other specified abnormal findings of blood chemistry: Secondary | ICD-10-CM | POA: Diagnosis not present

## 2017-07-09 DIAGNOSIS — Z124 Encounter for screening for malignant neoplasm of cervix: Secondary | ICD-10-CM | POA: Diagnosis not present

## 2017-07-09 DIAGNOSIS — L304 Erythema intertrigo: Secondary | ICD-10-CM

## 2017-07-09 DIAGNOSIS — J301 Allergic rhinitis due to pollen: Secondary | ICD-10-CM

## 2017-07-09 DIAGNOSIS — E559 Vitamin D deficiency, unspecified: Secondary | ICD-10-CM | POA: Diagnosis not present

## 2017-07-09 DIAGNOSIS — R87610 Atypical squamous cells of undetermined significance on cytologic smear of cervix (ASC-US): Secondary | ICD-10-CM | POA: Diagnosis not present

## 2017-07-09 MED ORDER — KETOCONAZOLE 2 % EX CREA: 1.0000 "application " | TOPICAL_CREAM | Freq: Two times a day (BID) | CUTANEOUS | 0 refills | Status: DC

## 2017-07-09 MED ORDER — AZELASTINE HCL 0.1 % NA SOLN: 1.0000 | Freq: Two times a day (BID) | NASAL | 12 refills | Status: DC

## 2017-07-09 NOTE — Progress Notes (Signed)
Patient: Gina [name] Female    DOB: August 15, 1955   62 y.o.   MRN: [id number] Visit Date: 07/09/2017  Today's Provider: Mar Daring, PA-C   Chief Complaint  Patient presents with  . Follow-up    Pap and discuss thyroid labs   Subjective:    HPI Patient here today for repeat pap. Patient had an unsatisfactory result on 06/27/17.   Follow up on labs resulted on 07/04/17. Patient had thyroid borderline high and low Vit D.  She also reports rash under breast is not improving yet.    No Known Allergies   Current Outpatient Medications:  .  ALPRAZolam (XANAX) 0.5 MG tablet, TAKE 1 TABLET BY MOUTH EVERY DAY & 1 TABLET AT BEDTIME, Disp: 180 tablet, Rfl: 1 .  amLODipine (NORVASC) 5 MG tablet, TAKE 1 TABLET BY MOUTH EVERY DAY, Disp: 90 tablet, Rfl: 3 .  B COMPLEX VITAMINS PO, Take 1 tablet by mouth daily., Disp: , Rfl:  .  benzonatate (TESSALON) 200 MG capsule, Take 1 capsule (200 mg total) by mouth 3 (three) times daily as needed for cough., Disp: 30 capsule, Rfl: 0 .  cetirizine (ZYRTEC ALLERGY) 10 MG tablet, Take 1 tablet by mouth daily., Disp: , Rfl:  .  Cholecalciferol (VITAMIN D3) 1000 UNITS CAPS, Take 1 capsule by mouth daily., Disp: , Rfl:  .  clotrimazole-betamethasone (LOTRISONE) cream, Apply 1 application topically 2 (two) times daily., Disp: 45 g, Rfl: 1 .  fluticasone (FLONASE) 50 MCG/ACT nasal spray, SPRAY 2 SPRAYS INTO EACH NOSTRIL EVERY DAY, Disp: 48 g, Rfl: 1 .  lansoprazole (PREVACID) 15 MG capsule, Take 1 capsule by mouth daily. , Disp: , Rfl:  .  montelukast (SINGULAIR) 10 MG tablet, TAKE 1 TABLET BY MOUTH EVERY DAY, Disp: 90 tablet, Rfl: 3 .  mupirocin ointment (BACTROBAN) 2 %, Place 1 application into the nose 2 (two) times daily., Disp: 22 g, Rfl: 0 .  nystatin cream (MYCOSTATIN), Apply 1 application topically 2 (two) times daily., Disp: 30 g, Rfl: 0 .  triamcinolone cream (KENALOG) 0.1 %, APPLY 1 APPLICATION TOPICALLY 2 (TWO) TIMES DAILY., Disp: 80 g,  Rfl: 0  Review of Systems  Constitutional: Positive for fatigue.  HENT: Positive for congestion, postnasal drip and sinus pressure.   Respiratory: Negative.   Cardiovascular: Negative.   Gastrointestinal: Negative.   Genitourinary: Negative.   Neurological: Negative.     Social History   Tobacco Use  . Smoking status: Never Smoker  . Smokeless tobacco: Never Used  Substance Use Topics  . Alcohol use: No    Alcohol/week: 0.0 oz   Objective:   BP 118/70 (BP Location: Right Arm, Patient Position: Sitting, Cuff Size: Normal)   Pulse 73   Temp 97.9 F (36.6 C) (Oral)   Resp 16   Wt 150 lb 6.4 oz (68.2 kg)   SpO2 98%   BMI 26.64 kg/m  Vitals:   07/09/17 1337  BP: 118/70  Pulse: 73  Resp: 16  Temp: 97.9 F (36.6 C)  TempSrc: Oral  SpO2: 98%  Weight: 150 lb 6.4 oz (68.2 kg)     Physical Exam  Constitutional: She appears well-developed and well-nourished. No distress.  Neck: Normal range of motion. Neck supple. No tracheal deviation present. No thyromegaly present.  Cardiovascular: Normal rate, regular rhythm and normal heart sounds. Exam reveals no gallop and no friction rub.  No murmur heard. Pulmonary/Chest: Effort normal and breath sounds normal. No respiratory distress. She has no  wheezes. She has no rales.  Genitourinary: Vagina normal and uterus normal. No labial fusion. There is no rash, tenderness, lesion or injury on the right labia. There is no rash, tenderness, lesion or injury on the left labia. Cervix exhibits no motion tenderness, no discharge and no friability. No vaginal discharge found.  Lymphadenopathy:    She has no cervical adenopathy.  Skin: She is not diaphoretic.  Vitals reviewed.      Assessment & Plan:     1. Encounter for Papanicolaou smear of cervix Pap collected today. Will send as below and f/u pending results. - Pap IG w/ reflex to HPV when ASC-U  2. Non-seasonal allergic rhinitis due to pollen Patient is already on flonase,  singulair and daily anti-histamine (normally Zyrtec). Still having breakthrough allergy symptoms. Will try astelin nasal spray as below.  - azelastine (ASTELIN) 0.1 % nasal spray; Place 1 spray into both nostrils 2 (two) times daily. Use in each nostril as directed  Dispense: 30 mL; Refill: 12  3. Intertrigo Not responding to Nystatin. Will try ketoconazole cream as below. Call if no improvements.  - ketoconazole (NIZORAL) 2 % cream; Apply 1 application topically 2 (two) times daily.  Dispense: 60 g; Refill: 0  4. Abnormal TSH Will recheck labs in 4 weeks.   5. Avitaminosis D Continue OTC supplement. Will recheck labs in 4 weeks.        Mar Daring, PA-C  [facility]

## 2017-07-09 NOTE — Patient Instructions (Signed)
Intertrigo Intertrigo is skin irritation or inflammation (dermatitis) that occurs when folds of skin rub together. The irritation can cause a rash and make skin raw and itchy. This condition most commonly occurs in the skin folds of these areas:  Toes.  Armpits.  Groin.  Belly.  Breasts.  Buttocks.  Intertrigo is not passed from person to person (is not contagious). What are the causes? This condition is caused by heat, moisture, friction, and lack of air circulation. The condition can be made worse by:  Sweat.  Bacteria or a fungus, such as yeast.  What increases the risk? This condition is more likely to occur if you have moisture in your skin folds. It is also more likely to develop in people who:  Have diabetes.  Are overweight.  Are on bed rest.  Live in a warm and moist climate.  Wear splints, braces, or other medical devices.  Are not able to control their bowels or bladder (have incontinence).  What are the signs or symptoms? Symptoms of this condition include:  A pink or red skin rash.  Brown patches on the skin.  Raw or scaly skin.  Itchiness.  A burning feeling.  Bleeding.  Leaking fluid.  A bad smell.  How is this diagnosed? This condition is diagnosed with a medical history and physical exam. You may also have a skin swab to test for bacteria or a fungus, such as yeast. How is this treated? Treatment may include:  Cleaning and drying your skin.  An oral antibiotic medicine or antibiotic skin cream for a bacterial infection.  Antifungal cream or pills for an infection that was caused by a fungus, such as yeast.  Steroid ointment to relieve itchiness and irritation.  Follow these instructions at home:  Keep the affected area clean and dry.  Do not scratch your skin.  Stay in a cool environment as much as possible. Use an air conditioner or fan, if available.  Apply over-the-counter and prescription medicines only as told by your  health care provider.  If you were prescribed an antibiotic medicine, use it as told by your health care provider. Do not stop using the antibiotic even if your condition improves.  Keep all follow-up visits as told by your health care provider. This is important. How is this prevented?  Maintain a healthy weight.  Take care of your feet, especially if you have diabetes. Foot care includes: ? Wearing shoes that fit well. ? Keeping your feet dry. ? Wearing clean, breathable socks.  Protect the skin around your groin and buttocks, especially if you have incontinence. Skin protection includes: ? Following a regular cleaning routine. ? Using moisturizers and skin protectants. ? Changing protection pads frequently.  Do not wear tight clothes. Wear clothes that are loose and absorbent. Wear clothes that are made of cotton.  Wear a bra that gives good support, if needed.  Shower and dry yourself thoroughly after activity. Use a hair dryer on a cool setting to dry between skin folds, especially after you bathe.  If you have diabetes, keep your blood sugar under control. Contact a health care provider if:  Your symptoms do not improve with treatment.  Your symptoms get worse or they spread.  You notice increased redness and warmth.  You have a fever. This information is not intended to replace advice given to you by your health care provider. Make sure you discuss any questions you have with your health care provider. Document Released: 04/24/2005 Document Revised: 09/30/2015   Document Reviewed: 10/26/2014 Elsevier Interactive Patient Education  2018 [employer] Inc[employer]  

## 2017-07-10 ENCOUNTER — Encounter: Payer: Self-pay | Admitting: Gastroenterology

## 2017-07-16 LAB — PAP IG W/ RFLX HPV ASCU: PAP SMEAR COMMENT: 0

## 2017-07-16 LAB — HPV DNA PROBE HIGH RISK, AMPLIFIED

## 2017-07-16 LAB — HPV, LOW VOLUME (REFLEX): HPV, LOW VOL REFLEX: NEGATIVE

## 2017-07-17 ENCOUNTER — Telehealth: Payer: Self-pay

## 2017-07-17 NOTE — Telephone Encounter (Signed)
Patient advised as directed below.  Thanks,  -Joseline 

## 2017-07-17 NOTE — Telephone Encounter (Signed)
-----   Message from Mar Daring, [place] sent at 07/16/2017  4:50 PM EDT ----- Pap is abnormal for Atypical cells of undetermined significance but is HPV negative which is reassuring. OK to wait and recheck pap in one year or can refer to GYN if patient desires further evaluation. Guidelines say to just recheck in one year.

## 2017-07-18 ENCOUNTER — Ambulatory Visit
Admission: RE | Admit: 2017-07-18 | Discharge: 2017-07-18 | Disposition: A | Discharge status: Home / Self Care | Payer: BLUE CROSS/BLUE SHIELD | Source: Ambulatory Visit | Attending: Physician Assistant | Admitting: Physician Assistant

## 2017-07-18 ENCOUNTER — Encounter (INDEPENDENT_AMBULATORY_CARE_PROVIDER_SITE_OTHER): Payer: Self-pay

## 2017-07-18 ENCOUNTER — Telehealth: Payer: Self-pay | Admitting: Physician Assistant

## 2017-07-18 DIAGNOSIS — Z1231 Encounter for screening mammogram for malignant neoplasm of breast: Secondary | ICD-10-CM | POA: Diagnosis not present

## 2017-07-18 NOTE — Telephone Encounter (Signed)
Patient is returning a call to [place].   She said she had problems understand what you were saying on the phone to her yesterday.  Please call her back

## 2017-07-18 NOTE — Telephone Encounter (Signed)
Advised patient again of pap results.

## 2017-07-21 ENCOUNTER — Other Ambulatory Visit: Payer: Self-pay | Admitting: Physician Assistant

## 2017-07-31 ENCOUNTER — Encounter: Payer: Self-pay | Admitting: Gastroenterology

## 2017-07-31 ENCOUNTER — Other Ambulatory Visit: Payer: Self-pay

## 2017-07-31 ENCOUNTER — Ambulatory Visit (INDEPENDENT_AMBULATORY_CARE_PROVIDER_SITE_OTHER): Payer: BLUE CROSS/BLUE SHIELD | Admitting: Gastroenterology

## 2017-07-31 VITALS — BP 121/68 | HR 71 | Temp 97.6°F | Ht 63.0 in | Wt 150.4 lb

## 2017-07-31 DIAGNOSIS — R195 Other fecal abnormalities: Secondary | ICD-10-CM | POA: Diagnosis not present

## 2017-07-31 DIAGNOSIS — R1310 Dysphagia, unspecified: Secondary | ICD-10-CM | POA: Diagnosis not present

## 2017-07-31 DIAGNOSIS — R1319 Other dysphagia: Secondary | ICD-10-CM

## 2017-07-31 MED ORDER — OMEPRAZOLE 20 MG PO CPDR: 20.0000 mg | DELAYED_RELEASE_CAPSULE | Freq: Two times a day (BID) | ORAL | 3 refills | Status: DC

## 2017-07-31 NOTE — Progress Notes (Signed)
Gina Darby, MD [address]  [place]  [place], [place] [address]  Main: [phone]  Fax: [phone]    Gastroenterology Consultation  Referring Provider:     Florian Buff* Primary Care Physician:  Margo Common, PA Primary Gastroenterologist:  Dr. Cephas [name] Reason for Consultation:     Dysphagia and fecal occult blood positive        HPI:   Gina [name] is a 62 y.o. female referred by Dr. Margo Common, PA  for consultation & management of dysphagia.  Dysphagia: She reports experiencing one year history of intermittent episodes of dysphagia to solid food especially meat and fried foods associated with regurgitation. She does recall having episodes of spitting saliva. She chews thoroughly and watchful of what she eats. She denies weight loss. She does have heartburn when she eats fried foods. She used to take Prevacid but currently taking over-the-counter antacids.she denies having had an upper endoscopy in the past, underwent barium esophagogram. Results not available. She denies smoking tobacco in the past. She does not drink alcohol.  Fecal occult blood positive: patient reports that she had a colonoscopy approximately 10 years ago and reportedly normal. Per her insurance she could choose either colonoscopy or Cologaurd or fecal occult blood testing. Apparently, patient chose fecal occult blood testing. She said she called her insurance about the colonoscopy as the FOBT came back positive and she was told that colonoscopy is not covered by her insurance. She denies any lower GI symptoms  NSAIDs: none  Antiplts/Anticoagulants/Anti thrombotics: none  GI Procedures: Colonoscopy about 10years ago Family history: Denies h/o GI malignancy   History reviewed. No pertinent past medical history.  Past Surgical History:  Procedure Laterality Date  . DILATION AND CURETTAGE OF UTERUS  1998  . FOOT SURGERY     08/2007     Current  Outpatient Medications:  .  ALPRAZolam (XANAX) 0.5 MG tablet, TAKE 1 TABLET BY MOUTH EVERY DAY & 1 TABLET AT BEDTIME, Disp: 180 tablet, Rfl: 1 .  amLODipine (NORVASC) 5 MG tablet, TAKE 1 TABLET BY MOUTH EVERY DAY, Disp: 90 tablet, Rfl: 3 .  azelastine (ASTELIN) 0.1 % nasal spray, Place 1 spray into both nostrils 2 (two) times daily. Use in each nostril as directed, Disp: 30 mL, Rfl: 12 .  B COMPLEX VITAMINS PO, Take 1 tablet by mouth daily., Disp: , Rfl:  .  benzonatate (TESSALON) 200 MG capsule, Take 1 capsule (200 mg total) by mouth 3 (three) times daily as needed for cough., Disp: 30 capsule, Rfl: 0 .  cetirizine (ZYRTEC ALLERGY) 10 MG tablet, Take 1 tablet by mouth daily., Disp: , Rfl:  .  Cholecalciferol (VITAMIN D3) 1000 UNITS CAPS, Take 1 capsule by mouth daily., Disp: , Rfl:  .  clotrimazole-betamethasone (LOTRISONE) cream, Apply 1 application topically 2 (two) times daily., Disp: 45 g, Rfl: 1 .  fluticasone (FLONASE) 50 MCG/ACT nasal spray, SPRAY 2 SPRAYS INTO EACH NOSTRIL EVERY DAY, Disp: 48 g, Rfl: 1 .  ketoconazole (NIZORAL) 2 % cream, Apply 1 application topically 2 (two) times daily., Disp: 60 g, Rfl: 0 .  lansoprazole (PREVACID) 15 MG capsule, Take 1 capsule by mouth daily. , Disp: , Rfl:  .  montelukast (SINGULAIR) 10 MG tablet, TAKE 1 TABLET BY MOUTH EVERY DAY, Disp: 90 tablet, Rfl: 3 .  mupirocin ointment (BACTROBAN) 2 %, Place 1 application into the nose 2 (two) times daily., Disp: 22 g, Rfl: 0 .  triamcinolone  cream (KENALOG) 0.1 %, APPLY 1 APPLICATION TOPICALLY 2 (TWO) TIMES DAILY., Disp: 80 g, Rfl: 0 .  omeprazole (PRILOSEC) 20 MG capsule, Take 1 capsule (20 mg total) by mouth 2 (two) times daily before a meal., Disp: 60 capsule, Rfl: 3   Family History  Problem Relation Age of Onset  . COPD Mother   . Emphysema Mother   . Arthritis Mother   . Hypertension Father   . Heart disease Father   . Coronary artery disease Father   . Arthritis Sister   . Throat cancer Brother    . Cancer Brother   . Heart attack Maternal Grandfather   . Alzheimer's disease Paternal Grandfather   . Breast cancer Paternal Aunt      Social History   Tobacco Use  . Smoking status: Never Smoker  . Smokeless tobacco: Never Used  Substance Use Topics  . Alcohol use: No    Alcohol/week: 0.0 oz  . Drug use: No    Allergies as of 07/31/2017  . (No Known Allergies)    Review of Systems:    All systems reviewed and negative except where noted in HPI.   Physical Exam:  BP 121/68   Pulse 71   Temp 97.6 F (36.4 C) (Oral)   Ht 5\' 3"  (1.6 m)   Wt 150 lb 6.4 oz (68.2 kg)   BMI 26.64 kg/m  No LMP recorded. Patient is postmenopausal.  General:   Alert,  Well-developed, well-nourished, pleasant and cooperative in NAD Head:  Normocephalic and atraumatic. Eyes:  Sclera clear, no icterus.   Conjunctiva pink. Ears:  Normal auditory acuity. Nose:  No deformity, discharge, or lesions. Mouth:  No deformity or lesions,oropharynx pink & moist. Neck:  Supple; no masses or thyromegaly. Lungs:  Respirations even and unlabored.  Clear throughout to auscultation.   No wheezes, crackles, or rhonchi. No acute distress. Heart:  Regular rate and rhythm; no murmurs, clicks, rubs, or gallops. Abdomen:  Normal bowel sounds. Soft, non-tender and non-distended without masses, hepatosplenomegaly or hernias noted.  No guarding or rebound tenderness.   Rectal: Not performed Msk:  Symmetrical without gross deformities. Good, equal movement & strength bilaterally. Pulses:  Normal pulses noted. Extremities:  No clubbing or edema.  No cyanosis. Neurologic:  Alert and oriented x3;  grossly normal neurologically. Skin:  Intact without significant lesions or rashes. No jaundice. Lymph Nodes:  No significant cervical adenopathy. Psych:  Alert and cooperative. Normal mood and affect.  Imaging Studies: No abdominal imaging  Assessment and Plan:   [name] is a 62 y.o. female with 1 year  history of dysphagia to solids, intermittent, without any weight loss, intermittent heartburn and FOBT positive is seen in consultation for dysphagia and + FOBT.  Dysphagia: Recommend omeprazole 20 mg twice daily EGD with or without dilation  FOBT+: Highly recommended her to undergo colonoscopy, she says she won't be able to afford it at this time as her insurance is not going to cover. Because she had a screening colonoscopy, technically she will need surveillance colonoscopy irrespective of the results of fecal occult blood testing. I will personally review her case with her insurance company.   Follow up in 3 months   Gina Darby, MD

## 2017-08-14 ENCOUNTER — Telehealth: Payer: Self-pay | Admitting: Physician Assistant

## 2017-08-14 ENCOUNTER — Telehealth: Payer: Self-pay | Admitting: Gastroenterology

## 2017-08-14 DIAGNOSIS — E039 Hypothyroidism, unspecified: Secondary | ICD-10-CM

## 2017-08-14 NOTE — Telephone Encounter (Signed)
Patient stated that Omeprazole was too expensive with her insurance.  She said the pharmacist recommended a [facility] brand equivalent and she has been taking one a day.    Thanks [employer]

## 2017-08-14 NOTE — Telephone Encounter (Signed)
Pt left vm she states she saw Dr. Marius Ditch and was perscriped rx it was too exspesive so pharmacy gave her another rx she would like a call please

## 2017-08-14 NOTE — Telephone Encounter (Signed)
Yes, labs ordered

## 2017-08-14 NOTE — Telephone Encounter (Signed)
Pt stated that she thinks she is supposed to have her thyroid recheck. Pt is requesting a call back to advise if that is correct and if so she would like a lab slip. Please advise. Thanks. TNP

## 2017-08-14 NOTE — Telephone Encounter (Signed)
Patient had TSH checked on 07/04/17 and it was borderline abnormal. Did you want to recheck? Please advise. Thanks!

## 2017-08-15 NOTE — Telephone Encounter (Signed)
Patient advised as below.  

## 2017-08-20 ENCOUNTER — Ambulatory Visit: Payer: BLUE CROSS/BLUE SHIELD | Admitting: Certified Registered Nurse Anesthetist

## 2017-08-20 ENCOUNTER — Encounter: Payer: Self-pay | Admitting: Anesthesiology

## 2017-08-20 ENCOUNTER — Encounter
Admission: RE | Disposition: A | Discharge status: Home / Self Care | Payer: Self-pay | Source: Ambulatory Visit | Attending: Gastroenterology

## 2017-08-20 ENCOUNTER — Ambulatory Visit
Admission: RE | Admit: 2017-08-20 | Discharge: 2017-08-20 | Disposition: A | Discharge status: Home / Self Care | Payer: BLUE CROSS/BLUE SHIELD | Source: Ambulatory Visit | Attending: Gastroenterology | Admitting: Gastroenterology

## 2017-08-20 DIAGNOSIS — K5711 Diverticulosis of small intestine without perforation or abscess with bleeding: Secondary | ICD-10-CM | POA: Insufficient documentation

## 2017-08-20 DIAGNOSIS — Z7951 Long term (current) use of inhaled steroids: Secondary | ICD-10-CM | POA: Diagnosis not present

## 2017-08-20 DIAGNOSIS — Z79899 Other long term (current) drug therapy: Secondary | ICD-10-CM | POA: Insufficient documentation

## 2017-08-20 DIAGNOSIS — K3189 Other diseases of stomach and duodenum: Secondary | ICD-10-CM | POA: Diagnosis not present

## 2017-08-20 DIAGNOSIS — K2950 Unspecified chronic gastritis without bleeding: Secondary | ICD-10-CM | POA: Diagnosis not present

## 2017-08-20 DIAGNOSIS — K219 Gastro-esophageal reflux disease without esophagitis: Secondary | ICD-10-CM | POA: Diagnosis not present

## 2017-08-20 DIAGNOSIS — F419 Anxiety disorder, unspecified: Secondary | ICD-10-CM | POA: Diagnosis not present

## 2017-08-20 DIAGNOSIS — K317 Polyp of stomach and duodenum: Secondary | ICD-10-CM | POA: Insufficient documentation

## 2017-08-20 DIAGNOSIS — K2960 Other gastritis without bleeding: Secondary | ICD-10-CM | POA: Diagnosis not present

## 2017-08-20 DIAGNOSIS — I10 Essential (primary) hypertension: Secondary | ICD-10-CM | POA: Insufficient documentation

## 2017-08-20 DIAGNOSIS — R1310 Dysphagia, unspecified: Secondary | ICD-10-CM

## 2017-08-20 HISTORY — PX: ESOPHAGOGASTRODUODENOSCOPY (EGD) WITH PROPOFOL: SHX5813

## 2017-08-20 SURGERY — ESOPHAGOGASTRODUODENOSCOPY (EGD) WITH PROPOFOL
Anesthesia: General

## 2017-08-20 MED ORDER — LIDOCAINE HCL (PF) 1 % IJ SOLN
2.0000 mL | Freq: Once | INTRAMUSCULAR | Status: AC
  Administered 2017-08-20: 0.3 mL via INTRADERMAL

## 2017-08-20 MED ORDER — LIDOCAINE HCL (PF) 1 % IJ SOLN
INTRAMUSCULAR | Status: AC
  Administered 2017-08-20: 0.3 mL via INTRADERMAL
  Filled 2017-08-20: qty 2

## 2017-08-20 MED ORDER — PROPOFOL 500 MG/50ML IV EMUL
INTRAVENOUS | Status: DC | PRN
  Administered 2017-08-20: 120 ug/kg/min via INTRAVENOUS

## 2017-08-20 MED ORDER — MIDAZOLAM HCL 2 MG/2ML IJ SOLN
INTRAMUSCULAR | Status: AC
  Filled 2017-08-20: qty 2

## 2017-08-20 MED ORDER — SODIUM CHLORIDE 0.9 % IV SOLN
INTRAVENOUS | Status: DC
  Administered 2017-08-20: 1000 mL via INTRAVENOUS

## 2017-08-20 MED ORDER — PROPOFOL 10 MG/ML IV BOLUS
INTRAVENOUS | Status: AC
  Filled 2017-08-20: qty 20

## 2017-08-20 MED ORDER — FENTANYL CITRATE (PF) 100 MCG/2ML IJ SOLN
INTRAMUSCULAR | Status: AC
  Filled 2017-08-20: qty 2

## 2017-08-20 MED ORDER — LIDOCAINE HCL (CARDIAC) 20 MG/ML IV SOLN
INTRAVENOUS | Status: DC | PRN
  Administered 2017-08-20: 100 mg via INTRAVENOUS

## 2017-08-20 MED ORDER — PROPOFOL 500 MG/50ML IV EMUL
INTRAVENOUS | Status: AC
  Filled 2017-08-20: qty 50

## 2017-08-20 MED ORDER — PROPOFOL 10 MG/ML IV BOLUS
INTRAVENOUS | Status: DC | PRN
  Administered 2017-08-20: 40 mg via INTRAVENOUS

## 2017-08-20 NOTE — Anesthesia Preprocedure Evaluation (Signed)
Anesthesia Evaluation  Patient identified by MRN, date of birth, ID band Patient awake    Reviewed: Allergy & Precautions, NPO status , Patient's Chart, lab work & pertinent test results, reviewed documented beta blocker date and time   Airway Mallampati: II  TM Distance: >3 FB     Dental  (+) Chipped   Pulmonary           Cardiovascular hypertension,      Neuro/Psych  Headaches, Anxiety    GI/Hepatic GERD  ,  Endo/Other    Renal/GU      Musculoskeletal   Abdominal   Peds  Hematology   Anesthesia Other Findings   Reproductive/Obstetrics                             Anesthesia Physical Anesthesia Plan  ASA: III  Anesthesia Plan: General   Post-op Pain Management:    Induction: Intravenous  PONV Risk Score and Plan:   Airway Management Planned:   Additional Equipment:   Intra-op Plan:   Post-operative Plan:   Informed Consent: I have reviewed the patients History and Physical, chart, labs and discussed the procedure including the risks, benefits and alternatives for the proposed anesthesia with the patient or authorized representative who has indicated his/her understanding and acceptance.     Plan Discussed with: CRNA  Anesthesia Plan Comments:         Anesthesia Quick Evaluation

## 2017-08-20 NOTE — Anesthesia Postprocedure Evaluation (Signed)
Anesthesia Post Note  Patient: Gina [name]  Procedure(s) Performed: ESOPHAGOGASTRODUODENOSCOPY (EGD) WITH PROPOFOL (N/A )  Patient location during evaluation: Endoscopy Anesthesia Type: General Level of consciousness: awake and alert Pain management: pain level controlled Vital Signs Assessment: post-procedure vital signs reviewed and stable Respiratory status: spontaneous breathing, nonlabored ventilation, respiratory function stable and patient connected to nasal cannula oxygen Cardiovascular status: blood pressure returned to baseline and stable Postop Assessment: no apparent nausea or vomiting Anesthetic complications: no     Last Vitals:  Vitals:   08/20/17 0930 08/20/17 0940  BP: (!) 147/83 (!) 143/91  Pulse: 61 (!) 59  Resp: 13 17  Temp:    SpO2: 100% 100%    Last Pain:  Vitals:   08/20/17 0910  TempSrc: Tympanic  PainSc:                  Samyuktha Brau S

## 2017-08-20 NOTE — H&P (Signed)
Cephas Darby, MD [address]  [place]  [place], [place] [address]  Main: [phone]  Fax: [phone] Pager: [phone]  Primary Care Physician:  Margo Common, PA Primary Gastroenterologist:  Dr. Cephas Darby  Pre-Procedure History & Physical: HPI:  Gina [name] is a 62 y.o. female is here for an endoscopy.   History reviewed. No pertinent past medical history.  Past Surgical History:  Procedure Laterality Date  . DILATION AND CURETTAGE OF UTERUS  1998  . FOOT SURGERY     08/2007    Prior to Admission medications   Medication Sig Start Date End Date Taking? Authorizing Provider  ALPRAZolam Duanne Moron) 0.5 MG tablet TAKE 1 TABLET BY MOUTH EVERY DAY & 1 TABLET AT BEDTIME 04/25/17   Mar Daring, PA-C  amLODipine (NORVASC) 5 MG tablet TAKE 1 TABLET BY MOUTH EVERY DAY 04/25/17   Mar Daring, PA-C  azelastine (ASTELIN) 0.1 % nasal spray Place 1 spray into both nostrils 2 (two) times daily. Use in each nostril as directed 07/09/17   Mar Daring, PA-C  B COMPLEX VITAMINS PO Take 1 tablet by mouth daily. 03/22/11   [provider]  benzonatate (TESSALON) 200 MG capsule Take 1 capsule (200 mg total) by mouth 3 (three) times daily as needed for cough. 06/18/17   Mar Daring, PA-C  cetirizine (ZYRTEC ALLERGY) 10 MG tablet Take 1 tablet by mouth daily. 03/22/11   [provider]  Cholecalciferol (VITAMIN D3) 1000 UNITS CAPS Take 1 capsule by mouth daily. 03/22/11   [provider]  clotrimazole-betamethasone (LOTRISONE) cream Apply 1 application topically 2 (two) times daily. 05/13/15   Mar Daring, PA-C  fluticasone (FLONASE) 50 MCG/ACT nasal spray SPRAY 2 SPRAYS INTO EACH NOSTRIL EVERY DAY 04/25/17   Mar Daring, PA-C  ketoconazole (NIZORAL) 2 % cream Apply 1 application topically 2 (two) times daily. 07/09/17   Mar Daring, PA-C  lansoprazole (PREVACID) 15 MG capsule Take 1 capsule by  mouth daily.  03/22/11   [provider]  montelukast (SINGULAIR) 10 MG tablet TAKE 1 TABLET BY MOUTH EVERY DAY 07/23/17   Mar Daring, PA-C  mupirocin ointment (BACTROBAN) 2 % Place 1 application into the nose 2 (two) times daily. 06/29/17   Mar Daring, PA-C  omeprazole (PRILOSEC) 20 MG capsule Take 1 capsule (20 mg total) by mouth 2 (two) times daily before a meal. 07/31/17   Vanga, Tally Due, MD  triamcinolone cream (KENALOG) 0.1 % APPLY 1 APPLICATION TOPICALLY 2 (TWO) TIMES DAILY. 05/02/17   Mar Daring, PA-C    Allergies as of 07/31/2017  . (No Known Allergies)    Family History  Problem Relation Age of Onset  . COPD Mother   . Emphysema Mother   . Arthritis Mother   . Hypertension Father   . Heart disease Father   . Coronary artery disease Father   . Arthritis Sister   . Throat cancer Brother   . Cancer Brother   . Heart attack Maternal Grandfather   . Alzheimer's disease Paternal Grandfather   . Breast cancer Paternal Aunt     Social History   Socioeconomic History  . Marital status: Married    Spouse name: Not on file  . Number of children: Not on file  . Years of education: Not on file  . Highest education level: Not on file  Occupational History  . Not on file  Social Needs  . Financial resource strain:  Not on file  . Food insecurity:    Worry: Not on file    Inability: Not on file  . Transportation needs:    Medical: Not on file    Non-medical: Not on file  Tobacco Use  . Smoking status: Never Smoker  . Smokeless tobacco: Never Used  Substance and Sexual Activity  . Alcohol use: No    Alcohol/week: 0.0 oz  . Drug use: No  . Sexual activity: Not on file  Lifestyle  . Physical activity:    Days per week: Not on file    Minutes per session: Not on file  . Stress: Not on file  Relationships  . Social connections:    Talks on phone: Not on file    Gets together: Not on file    Attends religious service: Not on  file    Active member of club or organization: Not on file    Attends meetings of clubs or organizations: Not on file    Relationship status: Not on file  . Intimate partner violence:    Fear of current or ex partner: Not on file    Emotionally abused: Not on file    Physically abused: Not on file    Forced sexual activity: Not on file  Other Topics Concern  . Not on file  Social History Narrative  . Not on file    Review of Systems: See HPI, otherwise negative ROS  Physical Exam: BP (!) 142/79   Pulse 62   Temp 98.7 F (37.1 C) (Tympanic)   Resp 16   Ht 5\' 3"  (1.6 m)   Wt 150 lb (68 kg)   SpO2 100%   BMI 26.57 kg/m  General:   Alert,  pleasant and cooperative in NAD Head:  Normocephalic and atraumatic. Neck:  Supple; no masses or thyromegaly. Lungs:  Clear throughout to auscultation.    Heart:  Regular rate and rhythm. Abdomen:  Soft, nontender and nondistended. Normal bowel sounds, without guarding, and without rebound.   Neurologic:  Alert and  oriented x4;  grossly normal neurologically.  Impression/Plan: Gina [name] is here for an endoscopy to be performed for dysphagia  Risks, benefits, limitations, and alternatives regarding  endoscopy have been reviewed with the patient.  Questions have been answered.  All parties agreeable.   Sherri Sear, MD  08/20/2017, 8:44 AM

## 2017-08-20 NOTE — Anesthesia Post-op Follow-up Note (Signed)
Anesthesia QCDR form completed.        

## 2017-08-20 NOTE — Anesthesia Procedure Notes (Signed)
Performed by: Raia Amico, CRNA Pre-anesthesia Checklist: Patient identified, Emergency Drugs available, Suction available, Patient being monitored and Timeout performed Patient Re-evaluated:Patient Re-evaluated prior to induction Oxygen Delivery Method: Nasal cannula Induction Type: IV induction       

## 2017-08-20 NOTE — Transfer of Care (Signed)
Immediate Anesthesia Transfer of Care Note  Patient: Gina [name]  Procedure(s) Performed: ESOPHAGOGASTRODUODENOSCOPY (EGD) WITH PROPOFOL (N/A )  Patient Location: PACU  Anesthesia Type:General  Level of Consciousness: sedated  Airway & Oxygen Therapy: Patient Spontanous Breathing and Patient connected to nasal cannula oxygen  Post-op Assessment: Report given to RN and Post -op Vital signs reviewed and stable  Post vital signs: Reviewed and stable  Last Vitals:  Vitals Value Taken Time  BP    Temp    Pulse    Resp    SpO2      Last Pain:  Vitals:   08/20/17 0813  TempSrc: Tympanic  PainSc: 0-No pain         Complications: No apparent anesthesia complications

## 2017-08-20 NOTE — Op Note (Signed)
[facility] Gastroenterology Patient Name: Gina [name] Procedure Date: 08/20/2017 8:41 AM MRN: [id number] Account #: [id number] Date of Birth: February 01, 1956 Admit Type: Outpatient Age: 62 Room: [facility] ENDO ROOM 2 Gender: Female Note Status: Finalized Procedure:            Upper GI endoscopy Indications:          Dysphagia Providers:            Lin Landsman MD, MD Referring MD:         Mar Daring (Referring MD) Medicines:            Monitored Anesthesia Care Complications:        No immediate complications. Estimated blood loss:                        Minimal. Procedure:            Pre-Anesthesia Assessment:                       - Prior to the procedure, a History and Physical was                        performed, and patient medications and allergies were                        reviewed. The patient is competent. The risks and                        benefits of the procedure and the sedation options and                        risks were discussed with the patient. All questions                        were answered and informed consent was obtained.                        Patient identification and proposed procedure were                        verified by the physician, the nurse, the                        anesthesiologist, the anesthetist and the technician in                        the pre-procedure area in the procedure room in the                        endoscopy suite. Mental Status Examination: alert and                        oriented. Airway Examination: normal oropharyngeal                        airway and neck mobility. Respiratory Examination:                        clear to auscultation. CV Examination: normal.  Prophylactic Antibiotics: The patient does not require                        prophylactic antibiotics. Prior Anticoagulants: The                        patient has taken no previous anticoagulant or                  antiplatelet agents. ASA Grade Assessment: II - A                        patient with mild systemic disease. After reviewing the                        risks and benefits, the patient was deemed in                        satisfactory condition to undergo the procedure. The                        anesthesia plan was to use monitored anesthesia care                        (MAC). Immediately prior to administration of                        medications, the patient was re-assessed for adequacy                        to receive sedatives. The heart rate, respiratory rate,                        oxygen saturations, blood pressure, adequacy of                        pulmonary ventilation, and response to care were                        monitored throughout the procedure. The physical status                        of the patient was re-assessed after the procedure.                       After obtaining informed consent, the endoscope was                        passed under direct vision. Throughout the procedure,                        the patient's blood pressure, pulse, and oxygen                        saturations were monitored continuously. The Endoscope                        was introduced through the mouth, and advanced to the                        second part of duodenum.  The upper GI endoscopy was                        accomplished without difficulty. The patient tolerated                        the procedure well. Findings:      A single 6 mm sessile polyp with no bleeding was found in the duodenal       bulb. The polyp was removed with a hot snare. Resection and retrieval       were complete.      The second portion of the duodenum was normal. Biopsies for histology       were taken with a cold forceps for evaluation of celiac disease.      Patchy mildly erythematous mucosa without bleeding was found in the       gastric body, at the incisura and in the gastric  antrum. Biopsies were       taken with a cold forceps for Helicobacter pylori testing.      The cardia and gastric fundus were normal on retroflexion.      The gastroesophageal junction and examined esophagus were normal.      A large non-bleeding diverticulum was found in the second portion of the       duodenum. Impression:           - A single duodenal polyp. Resected and retrieved.                       - Normal second portion of the duodenum. Biopsied.                       - Erythematous mucosa in the gastric body, incisura and                        antrum. Biopsied.                       - Normal gastroesophageal junction and esophagus. Recommendation:       - Await pathology results.                       - Discharge patient to home.                       - Resume previous diet today.                       - Continue present medications.                       - Return to my office as previously scheduled. Procedure Code(s):    --- Professional ---                       [phone], Esophagogastroduodenoscopy, flexible, transoral;                        with removal of tumor(s), polyp(s), or other lesion(s)                        by snare technique  59935, 59, Esophagogastroduodenoscopy, flexible,                        transoral; with biopsy, single or multiple Diagnosis Code(s):    --- Professional ---                       K31.7, Polyp of stomach and duodenum                       K31.89, Other diseases of stomach and duodenum                       R13.10, Dysphagia, unspecified CPT copyright 2017 American [employer] Association. All rights reserved. The codes documented in this report are preliminary and upon coder review may  be revised to meet current compliance requirements. Dr. Ulyess Mort Lin Landsman MD, MD 08/20/2017 9:08:43 AM This report has been signed electronically. Number of Addenda: 0 Note Initiated On: 08/20/2017 8:41 AM      [facility]

## 2017-08-21 LAB — SURGICAL PATHOLOGY

## 2017-08-22 ENCOUNTER — Encounter: Payer: Self-pay | Admitting: Gastroenterology

## 2017-08-23 ENCOUNTER — Telehealth: Payer: Self-pay | Admitting: Gastroenterology

## 2017-08-23 NOTE — Telephone Encounter (Signed)
Pt is calling for results on biopsy

## 2017-08-28 DIAGNOSIS — E039 Hypothyroidism, unspecified: Secondary | ICD-10-CM | POA: Diagnosis not present

## 2017-08-28 NOTE — Telephone Encounter (Signed)
Patient has been informed recent upper endoscopy biopsy came back normal.

## 2017-08-29 LAB — T4 AND TSH
T4 TOTAL: 6.8 ug/dL (ref 4.5–12.0)
TSH: 3.59 u[IU]/mL (ref 0.450–4.500)

## 2017-09-06 ENCOUNTER — Encounter: Payer: Self-pay | Admitting: Physician Assistant

## 2017-09-06 ENCOUNTER — Ambulatory Visit (INDEPENDENT_AMBULATORY_CARE_PROVIDER_SITE_OTHER): Payer: BLUE CROSS/BLUE SHIELD | Admitting: Physician Assistant

## 2017-09-06 VITALS — BP 130/90 | HR 80 | Temp 98.2°F | Resp 16 | Wt 148.0 lb

## 2017-09-06 DIAGNOSIS — R1310 Dysphagia, unspecified: Secondary | ICD-10-CM | POA: Diagnosis not present

## 2017-09-06 DIAGNOSIS — R21 Rash and other nonspecific skin eruption: Secondary | ICD-10-CM

## 2017-09-06 MED ORDER — PREDNISONE 10 MG (21) PO TBPK
ORAL_TABLET | ORAL | 0 refills | Status: DC
Start: 2017-09-06 — End: 2017-09-14

## 2017-09-06 MED ORDER — PANTOPRAZOLE SODIUM 40 MG PO TBEC
40.0000 mg | DELAYED_RELEASE_TABLET | Freq: Every day | ORAL | 3 refills | Status: DC
Start: 2017-09-06 — End: 2017-12-11

## 2017-09-06 NOTE — Patient Instructions (Signed)
Stop omeprazole Start steroid taper Do not take any stomach medications x 1 week. After one week start Pantoprazole (if covered). If not covered by insurance get Nexium (esomeprazole) OTC.

## 2017-09-06 NOTE — Progress Notes (Signed)
Patient: Gina [name] Female    DOB: 09-04-1955   62 y.o.   MRN: [id number] Visit Date: 09/06/2017  Today's Provider: Mar Daring, PA-C   Chief Complaint  Patient presents with  . Rash   Subjective:    HPI Patient here today C/O rash on several areas of body. Patient reports that she has tried OTC creams. Patient reports she talked to pharmacist at [facility], they just recommended OTC creams. Patient reports no improvement with creams. Patient went back to [facility] and asked if any of her medications were causing rash. Patient concerned that omeprazole is causing rash. Patient reports she has been out in the yard also. Patient reports rash is worse in the afternoons.     No Known Allergies   Current Outpatient Medications:  .  ALPRAZolam (XANAX) 0.5 MG tablet, TAKE 1 TABLET BY MOUTH EVERY DAY & 1 TABLET AT BEDTIME, Disp: 180 tablet, Rfl: 1 .  amLODipine (NORVASC) 5 MG tablet, TAKE 1 TABLET BY MOUTH EVERY DAY, Disp: 90 tablet, Rfl: 3 .  azelastine (ASTELIN) 0.1 % nasal spray, Place 1 spray into both nostrils 2 (two) times daily. Use in each nostril as directed, Disp: 30 mL, Rfl: 12 .  B COMPLEX VITAMINS PO, Take 1 tablet by mouth daily., Disp: , Rfl:  .  cetirizine (ZYRTEC ALLERGY) 10 MG tablet, Take 1 tablet by mouth daily., Disp: , Rfl:  .  Cholecalciferol (VITAMIN D3) 1000 UNITS CAPS, Take 1 capsule by mouth daily., Disp: , Rfl:  .  clotrimazole-betamethasone (LOTRISONE) cream, Apply 1 application topically 2 (two) times daily., Disp: 45 g, Rfl: 1 .  fluticasone (FLONASE) 50 MCG/ACT nasal spray, SPRAY 2 SPRAYS INTO EACH NOSTRIL EVERY DAY, Disp: 48 g, Rfl: 1 .  ketoconazole (NIZORAL) 2 % cream, Apply 1 application topically 2 (two) times daily., Disp: 60 g, Rfl: 0 .  montelukast (SINGULAIR) 10 MG tablet, TAKE 1 TABLET BY MOUTH EVERY DAY, Disp: 90 tablet, Rfl: 3 .  mupirocin ointment (BACTROBAN) 2 %, Place 1 application into the nose 2 (two) times daily., Disp: 22 g, Rfl: 0 .   omeprazole (PRILOSEC) 20 MG capsule, Take 1 capsule (20 mg total) by mouth 2 (two) times daily before a meal., Disp: 60 capsule, Rfl: 3 .  triamcinolone cream (KENALOG) 0.1 %, APPLY 1 APPLICATION TOPICALLY 2 (TWO) TIMES DAILY., Disp: 80 g, Rfl: 0  Review of Systems  Constitutional: Negative.   Respiratory: Negative.   Cardiovascular: Negative.   Gastrointestinal: Negative.   Skin: Positive for rash.  Neurological: Negative.     Social History   Tobacco Use  . Smoking status: Never Smoker  . Smokeless tobacco: Never Used  Substance Use Topics  . Alcohol use: No    Alcohol/week: 0.0 oz   Objective:   BP 130/90 (BP Location: Left Arm, Patient Position: Sitting, Cuff Size: Normal)   Pulse 80   Temp 98.2 F (36.8 C) (Oral)   Resp 16   Wt 148 lb (67.1 kg)   SpO2 99%   BMI 26.22 kg/m  Vitals:   09/06/17 0957  BP: 130/90  Pulse: 80  Resp: 16  Temp: 98.2 F (36.8 C)  TempSrc: Oral  SpO2: 99%  Weight: 148 lb (67.1 kg)     Physical Exam  Constitutional: She appears well-developed and well-nourished. No distress.  Neck: Normal range of motion. Neck supple. No JVD present. No tracheal deviation present. No thyromegaly present.  Cardiovascular: Normal rate, regular rhythm and  normal heart sounds. Exam reveals no gallop and no friction rub.  No murmur heard. Pulmonary/Chest: Effort normal and breath sounds normal. No respiratory distress. She has no wheezes. She has no rales.  Lymphadenopathy:    She has no cervical adenopathy.  Skin: Rash noted. Rash is papular (diffusely located on entire body sparing face, soles, palms and genitals). She is not diaphoretic.  Vitals reviewed.       Assessment & Plan:     1. Rash Suspect allergic from omeprazole. Will stop omeprazole. Use steroid taper as below. Continue benadryl prn. After 1 week of being off omeprazole if rash is improved may start pantoprazole as below. Call if rash does not improve or recurs.  - predniSONE  (STERAPRED UNI-PAK 21 TAB) 10 MG (21) TBPK tablet; 6 day taper; take as directed on package instructions  Dispense: 21 tablet; Refill: 0  2. Dysphagia, unspecified type Switching from omeprazole to pantoprazole.  - pantoprazole (PROTONIX) 40 MG tablet; Take 1 tablet (40 mg total) by mouth daily.  Dispense: 30 tablet; Refill: [place], PA-C  [facility]

## 2017-09-10 ENCOUNTER — Emergency Department (HOSPITAL_COMMUNITY): Payer: BLUE CROSS/BLUE SHIELD

## 2017-09-10 ENCOUNTER — Emergency Department (HOSPITAL_COMMUNITY)
Admission: EM | Admit: 2017-09-10 | Discharge: 2017-09-11 | Disposition: A | Discharge status: Home / Self Care | Payer: BLUE CROSS/BLUE SHIELD | Attending: Emergency Medicine | Admitting: Emergency Medicine

## 2017-09-10 ENCOUNTER — Other Ambulatory Visit: Payer: Self-pay

## 2017-09-10 DIAGNOSIS — Z79899 Other long term (current) drug therapy: Secondary | ICD-10-CM | POA: Insufficient documentation

## 2017-09-10 DIAGNOSIS — M25512 Pain in left shoulder: Secondary | ICD-10-CM | POA: Insufficient documentation

## 2017-09-10 DIAGNOSIS — S299XXA Unspecified injury of thorax, initial encounter: Secondary | ICD-10-CM | POA: Diagnosis not present

## 2017-09-10 DIAGNOSIS — S199XXA Unspecified injury of neck, initial encounter: Secondary | ICD-10-CM | POA: Diagnosis not present

## 2017-09-10 DIAGNOSIS — R410 Disorientation, unspecified: Secondary | ICD-10-CM | POA: Insufficient documentation

## 2017-09-10 DIAGNOSIS — R51 Headache: Secondary | ICD-10-CM | POA: Insufficient documentation

## 2017-09-10 DIAGNOSIS — M791 Myalgia, unspecified site: Secondary | ICD-10-CM | POA: Diagnosis not present

## 2017-09-10 DIAGNOSIS — S0990XA Unspecified injury of head, initial encounter: Secondary | ICD-10-CM | POA: Diagnosis not present

## 2017-09-10 DIAGNOSIS — R4182 Altered mental status, unspecified: Secondary | ICD-10-CM | POA: Diagnosis present

## 2017-09-10 DIAGNOSIS — T148XXA Other injury of unspecified body region, initial encounter: Secondary | ICD-10-CM | POA: Diagnosis not present

## 2017-09-10 DIAGNOSIS — T07XXXA Unspecified multiple injuries, initial encounter: Secondary | ICD-10-CM

## 2017-09-10 DIAGNOSIS — S4992XA Unspecified injury of left shoulder and upper arm, initial encounter: Secondary | ICD-10-CM | POA: Diagnosis not present

## 2017-09-10 LAB — CBC WITH DIFFERENTIAL/PLATELET
BASOS ABS: 0 10*3/uL (ref 0.0–0.1)
Basophils Relative: 0 %
Eosinophils Absolute: 0 10*3/uL (ref 0.0–0.7)
Eosinophils Relative: 0 %
HCT: 41.8 % (ref 36.0–46.0)
Hemoglobin: 13.6 g/dL (ref 12.0–15.0)
LYMPHS ABS: 0.7 10*3/uL (ref 0.7–4.0)
Lymphocytes Relative: 10 %
MCH: 30.4 pg (ref 26.0–34.0)
MCHC: 32.5 g/dL (ref 30.0–36.0)
MCV: 93.5 fL (ref 78.0–100.0)
Monocytes Absolute: 0.4 10*3/uL (ref 0.1–1.0)
Monocytes Relative: 5 %
NEUTROS PCT: 85 %
Neutro Abs: 6 10*3/uL (ref 1.7–7.7)
Platelets: 302 10*3/uL (ref 150–400)
RBC: 4.47 MIL/uL (ref 3.87–5.11)
RDW: 14.4 % (ref 11.5–15.5)
WBC: 7.1 10*3/uL (ref 4.0–10.5)

## 2017-09-10 LAB — COMPREHENSIVE METABOLIC PANEL
ALT: 16 U/L (ref 14–54)
ANION GAP: 14 (ref 5–15)
AST: 24 U/L (ref 15–41)
Albumin: 4.2 g/dL (ref 3.5–5.0)
Alkaline Phosphatase: 64 U/L (ref 38–126)
BILIRUBIN TOTAL: 0.5 mg/dL (ref 0.3–1.2)
BUN: 19 mg/dL (ref 6–20)
CHLORIDE: 107 mmol/L (ref 101–111)
CO2: 20 mmol/L — ABNORMAL LOW (ref 22–32)
Calcium: 9.3 mg/dL (ref 8.9–10.3)
Creatinine, Ser: 0.82 mg/dL (ref 0.44–1.00)
Glucose, Bld: 105 mg/dL — ABNORMAL HIGH (ref 65–99)
Potassium: 4.3 mmol/L (ref 3.5–5.1)
Sodium: 141 mmol/L (ref 135–145)
TOTAL PROTEIN: 7.3 g/dL (ref 6.5–8.1)

## 2017-09-10 MED ORDER — FENTANYL CITRATE (PF) 100 MCG/2ML IJ SOLN
50.0000 ug | Freq: Once | INTRAMUSCULAR | Status: DC
  Filled 2017-09-10: qty 2

## 2017-09-10 MED ORDER — ACETAMINOPHEN 325 MG PO TABS: 650.0000 mg | ORAL_TABLET | Freq: Four times a day (QID) | ORAL | 0 refills | Status: DC | PRN

## 2017-09-10 MED ORDER — METHOCARBAMOL 500 MG PO TABS: 500.0000 mg | ORAL_TABLET | Freq: Two times a day (BID) | ORAL | 0 refills | Status: DC

## 2017-09-10 MED ORDER — HYDROCODONE-ACETAMINOPHEN 5-325 MG PO TABS
1.0000 | ORAL_TABLET | Freq: Once | ORAL | Status: AC
  Administered 2017-09-11: 1 via ORAL
  Filled 2017-09-10: qty 1

## 2017-09-10 MED ORDER — IBUPROFEN 600 MG PO TABS: 600.0000 mg | ORAL_TABLET | Freq: Four times a day (QID) | ORAL | 0 refills | Status: DC | PRN

## 2017-09-10 MED ORDER — METHOCARBAMOL 500 MG PO TABS
500.0000 mg | ORAL_TABLET | Freq: Once | ORAL | Status: AC
  Administered 2017-09-11: 500 mg via ORAL
  Filled 2017-09-10: qty 1
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
## 2017-09-10 NOTE — ED Notes (Signed)
Pt returned from CT. Family at bedside
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
## 2017-09-10 NOTE — ED Notes (Signed)
Pt to CT at this time.

## 2017-09-10 NOTE — ED Notes (Signed)
Daughter at bedside.

## 2017-09-10 NOTE — ED Notes (Signed)
Pt took small steps in room.Marland Kitchen

## 2017-09-10 NOTE — ED Notes (Signed)
Hard C-Collar applied.
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
## 2017-09-13 ENCOUNTER — Telehealth: Payer: Self-pay | Admitting: Emergency Medicine
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
## 2017-09-14 ENCOUNTER — Ambulatory Visit (INDEPENDENT_AMBULATORY_CARE_PROVIDER_SITE_OTHER): Payer: BLUE CROSS/BLUE SHIELD | Admitting: Physician Assistant

## 2017-09-14 ENCOUNTER — Encounter: Payer: Self-pay | Admitting: Physician Assistant

## 2017-09-14 DIAGNOSIS — S060X0D Concussion without loss of consciousness, subsequent encounter: Secondary | ICD-10-CM | POA: Diagnosis not present

## 2017-09-14 NOTE — Patient Instructions (Signed)
Concussion, Adult A concussion is a brain injury from a direct hit (blow) to the head or body. This blow causes the brain to shake quickly back and forth inside the skull. This can damage brain cells and cause chemical changes in the brain. A concussion may also be known as a mild traumatic brain injury (TBI). Concussions are usually not life-threatening, but the effects of a concussion can be serious. If you have a concussion, you are more likely to experience concussion-like symptoms after a direct blow to the head in the future. What are the causes? This condition is caused by:  A direct blow to the head, such as from running into another player during a game, being hit in a fight, or hitting your head on a hard surface.  A jolt of the head or neck that causes the brain to move back and forth inside the skull, such as in a car crash.  What are the signs or symptoms? The signs of a concussion can be hard to notice. Early on, they may be missed by you, family members, and health care providers. You may look fine but act or feel differently. Symptoms are usually temporary, but they may last for days, weeks, or even longer. Some symptoms may appear right away but other symptoms may not show up for hours or days. Every head injury is different. Symptoms may include:  Headaches. This can include a feeling of pressure in the head.  Memory problems.  Trouble concentrating, organizing, or making decisions.  Slowness in thinking, acting or reacting, speaking, or reading.  Confusion.  Fatigue.  Changes in eating or sleeping patterns.  Problems with coordination or balance.  Nausea or vomiting.  Numbness or tingling.  Sensitivity to light or noise.  Vision or hearing problems.  Reduced sense of smell.  Irritability or mood changes.  Dizziness.  Lack of motivation.  Seeing or hearing things that other people do not see or hear (hallucinations).  How is this diagnosed? This  condition is diagnosed based on:  Your symptoms.  A description of your injury.  You may also have tests, including:  Imaging tests, such as a CT scan or MRI. These are done to look for signs of brain injury.  Neuropsychological tests. These measure your thinking, understanding, learning, and remembering abilities.  How is this treated? This condition is treated with physical and mental rest and careful observation, usually at home. If the concussion is severe, you may need to stay home from work for a while. You may be referred to a concussion clinic or to other health care providers for management. It is important that you tell your health care provider if:  You are taking any medicines, including prescription medicines, over-the-counter medicines, and natural remedies. Some medicines, such as blood thinners (anticoagulants) and aspirin, may increase the chance of complications, such as bleeding.  You are taking or have taken alcohol or illegal drugs. Alcohol and certain other drugs may slow your recovery and can put you at risk of further injury.  How fast you will recover from a concussion depends on many factors, such as how severe your concussion is, what part of your brain was injured, how old you are, and how healthy you were before the concussion. Recovery can take time. It is important to wait to return to activity until a health care provider says it is safe to do that and your symptoms are completely gone. Follow these instructions at home: Activity  Limit activities that   require a lot of thought or concentration. These may include: ? Doing homework or job-related work. ? Watching TV. ? Working on the computer. ? Playing memory games and puzzles.  Rest. Rest helps the brain to heal. Make sure you: ? Get plenty of sleep at night. Avoid staying up late at night. ? Keep the same bedtime hours on weekends and weekdays. ? Rest during the day. Take naps or rest breaks when you  feel tired.  Having another concussion before the first one has healed can be dangerous. Do not do high-risk activities that could cause a second concussion, such as riding a bicycle or playing sports.  Ask your health care provider when you can return to your normal activities, such as school, work, athletics, driving, riding a bicycle, or using heavy machinery. Your ability to react may be slower after a brain injury. Never do these activities if you are dizzy. Your health care provider will likely give you a plan for gradually returning to activities. General instructions  Take over-the-counter and prescription medicines only as told by your health care provider.  Do not drink alcohol until your health care provider says you can.  If it is harder than usual to remember things, write them down.  If you are easily distracted, try to do one thing at a time. For example, do not try to watch TV while fixing dinner.  Talk with family members or close friends when making important decisions.  Watch your symptoms and tell others to do the same. Complications sometimes occur after a concussion. Older adults with a brain injury may have a higher risk of serious complications, such as a blood clot in the brain.  Tell your teachers, school nurse, school counselor, coach, athletic trainer, or work manager about your injury, symptoms, and restrictions. Tell them about what you can or cannot do. They should watch for: ? Increased problems with attention or concentration. ? Increased difficulty remembering or learning new information. ? Increased time needed to complete tasks or assignments. ? Increased irritability or decreased ability to cope with stress. ? Increased symptoms.  Keep all follow-up visits as told by your health care provider. This is important. How is this prevented? It is very important to avoid another brain injury, especially as you recover. In rare cases, another injury can lead  to permanent brain damage, brain swelling, or death. The risk of this is greatest during the first 7-10 days after a head injury. Avoid injuries by:  Wearing a seat belt when riding in a car.  Wearing a helmet when biking, skiing, skateboarding, skating, or doing similar activities.  Avoiding activities that could lead to a second concussion, such as contact or recreational sports, until your health care provider says it is okay.  Taking safety measures in your home, such as: ? Removing clutter and tripping hazards from floors and stairways. ? Using grab bars in bathrooms and handrails by stairs. ? Placing non-slip mats on floors and in bathtubs. ? Improving lighting in dim areas.  Contact a health care provider if:  Your symptoms get worse.  You have new symptoms.  You continue to have symptoms for more than 2 weeks. Get help right away if:  You have severe or worsening headaches.  You have weakness or numbness in any part of your body.  Your coordination gets worse.  You vomit repeatedly.  You are sleepier.  The pupil of one eye is larger than the other.  You have convulsions or a   seizure.  Your speech is slurred.  Your fatigue, confusion, or irritability gets worse.  You cannot recognize people or places.  You have neck pain.  It is difficult to wake you up.  You have unusual behavior changes.  You lose consciousness. Summary  A concussion is a brain injury from a direct hit (blow) to the head or body.  A concussion may also be called a mild traumatic brain injury (TBI).  You may have imaging tests and neuropsychological tests to diagnose a concussion.  This condition is treated with physical and mental rest and careful observation.  Ask your health care provider when you can return to your normal activities, such as school, work, athletics, driving, riding a bicycle, or using heavy machinery. Follow safety instructions as told by your health care  provider. This information is not intended to replace advice given to you by your health care provider. Make sure you discuss any questions you have with your health care provider. Document Released: 07/15/2003 Document Revised: 04/04/2016 Document Reviewed: 04/04/2016 Elsevier Interactive Patient Education  2018 [employer]   Post-Concussion Syndrome Post-concussion syndrome is the symptoms that can occur after a head injury. These symptoms can last from weeks to months. Follow these instructions at home:  Take medicines only as told by your doctor.  Do not take aspirin.  Sleep with your head raised to help with headaches.  Avoid activities that can cause another head injury. ? Do not play contact sports like football, hockey, soccer, or basketball. ? Do not do other risky activities like downhill skiing, martial arts, or horseback riding until your doctor says it is okay.  Keep all follow-up visits as told by your doctor. This is important. Contact a doctor if:  You have a harder time: ? Paying attention. ? Focusing. ? Remembering. ? Learning new information. ? Dealing with stress.  You need more time to complete tasks.  You are easily bothered (irritable).  You have more symptoms. Get help if you have any of these symptoms for more than two weeks after your injury:  Long-lasting (chronic) headaches.  Dizziness.  Trouble balancing.  Feeling sick to your stomach (nauseous).  Trouble with your vision.  Noise or light bothers you more.  Depression.  Mood swings.  Feeling worried (anxious).  Easily bothered.  Memory problems.  Trouble concentrating or paying attention.  Sleep problems.  Feeling tired all of the time.  Get help right away if:  You feel confused.  You feel very sleepy.  You are hard to wake up.  You feel sick to your stomach.  You keep throwing up (vomiting).  You feel like you are moving when you are not (vertigo).  Your  eyes move back and forth very quickly.  You start shaking (convulsing) or pass out (faint).  You have very bad headaches that do not get better with medicine.  You cannot use your arms or legs like normal.  One of the black centers of your eyes (pupils) is bigger than the other.  You have clear or bloody fluid coming from your nose or ears.  Your problems get worse, not better. This information is not intended to replace advice given to you by your health care provider. Make sure you discuss any questions you have with your health care provider. Document Released: 06/01/2004 Document Revised: 09/30/2015 Document Reviewed: 07/30/2013 Elsevier Interactive Patient Education  2018 [employer].
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
## 2017-09-19 ENCOUNTER — Telehealth: Payer: Self-pay | Admitting: Family Medicine

## 2017-09-19 NOTE — Telephone Encounter (Signed)
Letter printed. This can be picked up at her convenience.

## 2017-09-19 NOTE — Telephone Encounter (Signed)
Patient aware letter is ready for pick up.
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
## 2017-09-27 ENCOUNTER — Encounter: Payer: Self-pay | Admitting: Physician Assistant

## 2017-09-27 ENCOUNTER — Ambulatory Visit (INDEPENDENT_AMBULATORY_CARE_PROVIDER_SITE_OTHER): Payer: BLUE CROSS/BLUE SHIELD | Admitting: Physician Assistant

## 2017-09-27 VITALS — BP 100/70 | HR 70 | Temp 98.2°F | Resp 16 | Wt 149.2 lb

## 2017-09-27 DIAGNOSIS — S060X0D Concussion without loss of consciousness, subsequent encounter: Secondary | ICD-10-CM
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
## 2017-09-28 ENCOUNTER — Ambulatory Visit: Payer: BLUE CROSS/BLUE SHIELD | Admitting: Physician Assistant

## 2017-10-03 ENCOUNTER — Other Ambulatory Visit: Payer: Self-pay | Admitting: Family Medicine

## 2017-10-03 MED ORDER — IBUPROFEN 600 MG PO TABS: 600.0000 mg | ORAL_TABLET | Freq: Four times a day (QID) | ORAL | 1 refills | Status: DC | PRN

## 2017-10-03 NOTE — Telephone Encounter (Signed)
Pt is requesting an order for ibuprofen 600 mg sent to [facility] in [place]  She said she was in last Thursday and you told her to call when she was out of the medication  Pt's call back is [phone]  [facility]

## 2017-10-08 ENCOUNTER — Ambulatory Visit (INDEPENDENT_AMBULATORY_CARE_PROVIDER_SITE_OTHER): Payer: BLUE CROSS/BLUE SHIELD | Admitting: Physician Assistant

## 2017-10-08 ENCOUNTER — Encounter: Payer: Self-pay | Admitting: Physician Assistant

## 2017-10-08 DIAGNOSIS — J40 Bronchitis, not specified as acute or chronic: Secondary | ICD-10-CM | POA: Diagnosis not present

## 2017-10-08 DIAGNOSIS — F0781 Postconcussional syndrome: Secondary | ICD-10-CM | POA: Diagnosis not present

## 2017-10-08 MED ORDER — PREDNISONE 10 MG (21) PO TBPK: ORAL_TABLET | ORAL | 0 refills | Status: DC

## 2017-10-08 MED ORDER — AZITHROMYCIN 250 MG PO TABS: ORAL_TABLET | ORAL | 0 refills | Status: DC
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
## 2017-10-08 NOTE — Patient Instructions (Signed)
Post-Concussion Syndrome Post-concussion syndrome is the symptoms that can occur after a head injury. These symptoms can last from weeks to months. Follow these instructions at home:  Take medicines only as told by your doctor.  Do not take aspirin.  Sleep with your head raised to help with headaches.  Avoid activities that can cause another head injury. ? Do not play contact sports like football, hockey, soccer, or basketball. ? Do not do other risky activities like downhill skiing, martial arts, or horseback riding until your doctor says it is okay.  Keep all follow-up visits as told by your doctor. This is important. Contact a doctor if:  You have a harder time: ? Paying attention. ? Focusing. ? Remembering. ? Learning new information. ? Dealing with stress.  You need more time to complete tasks.  You are easily bothered (irritable).  You have more symptoms. Get help if you have any of these symptoms for more than two weeks after your injury:  Long-lasting (chronic) headaches.  Dizziness.  Trouble balancing.  Feeling sick to your stomach (nauseous).  Trouble with your vision.  Noise or light bothers you more.  Depression.  Mood swings.  Feeling worried (anxious).  Easily bothered.  Memory problems.  Trouble concentrating or paying attention.  Sleep problems.  Feeling tired all of the time.  Get help right away if:  You feel confused.  You feel very sleepy.  You are hard to wake up.  You feel sick to your stomach.  You keep throwing up (vomiting).  You feel like you are moving when you are not (vertigo).  Your eyes move back and forth very quickly.  You start shaking (convulsing) or pass out (faint).  You have very bad headaches that do not get better with medicine.  You cannot use your arms or legs like normal.  One of the black centers of your eyes (pupils) is bigger than the other.  You have clear or bloody fluid coming from your  nose or ears.  Your problems get worse, not better. This information is not intended to replace advice given to you by your health care provider. Make sure you discuss any questions you have with your health care provider. Document Released: 06/01/2004 Document Revised: 09/30/2015 Document Reviewed: 07/30/2013 Elsevier Interactive Patient Education  2018 [employer]  

## 2017-10-15 ENCOUNTER — Ambulatory Visit (INDEPENDENT_AMBULATORY_CARE_PROVIDER_SITE_OTHER): Payer: BLUE CROSS/BLUE SHIELD | Admitting: Physician Assistant

## 2017-10-15 ENCOUNTER — Encounter: Payer: Self-pay | Admitting: Physician Assistant

## 2017-10-15 VITALS — BP 120/70 | HR 68 | Temp 98.0°F | Resp 16 | Wt 149.0 lb

## 2017-10-15 DIAGNOSIS — F0781 Postconcussional syndrome: Secondary | ICD-10-CM

## 2017-10-15 DIAGNOSIS — M25512 Pain in left shoulder: Secondary | ICD-10-CM

## 2017-10-15 NOTE — Patient Instructions (Signed)
Adhesive Capsulitis Adhesive capsulitis is inflammation of the tendons and ligaments that surround the shoulder joint (shoulder capsule). This condition causes the shoulder to become stiff and painful to move. Adhesive capsulitis is also called frozen shoulder. What are the causes? This condition may be caused by:  An injury to the shoulder joint.  Straining the shoulder.  Not moving the shoulder for a period of time. This can happen if your arm was injured or in a sling.  Long-standing health problems, such as: ? Diabetes. ? Thyroid problems. ? Heart disease. ? Stroke. ? Rheumatoid arthritis. ? Lung disease.  In some cases, the cause may not be known. What increases the risk? This condition is more likely to develop in:  Women.  People who are older than 60 years of age.  What are the signs or symptoms? Symptoms of this condition include:  Pain in the shoulder when moving the arm. There may also be pain when parts of the shoulder are touched. The pain is worse at night or when at rest.  Soreness or aching in the shoulder.  Inability to move the shoulder normally.  Muscle spasms.  How is this diagnosed? This condition is diagnosed with a physical exam and imaging tests, such as an X-ray or MRI. How is this treated? This condition may be treated with:  Treatment of the underlying cause or condition.  Physical therapy. This involves performing exercises to get the shoulder moving again.  Medicine. Medicine may be given to relieve pain, inflammation, or muscle spasms.  Steroid injections into the shoulder joint.  Shoulder manipulation. This is a procedure to move the shoulder into another position. It is done after you are given a medicine to make you fall asleep (general anesthetic). The joint may also be injected with salt water at high pressure to break down scarring.  Surgery. This may be done in severe cases when other treatments have failed.  Although most  people recover completely from adhesive capsulitis, some may not regain the full movement of the shoulder. Follow these instructions at home:  Take over-the-counter and prescription medicines only as told by your health care provider.  If you are being treated with physical therapy, follow instructions from your physical therapist.  Avoid exercises that put a lot of demand on your shoulder, such as throwing. These exercises can make pain worse.  If directed, apply ice to the injured area: ? Put ice in a plastic bag. ? Place a towel between your skin and the bag. ? Leave the ice on for 20 minutes, 2-3 times per day. Contact a health care provider if:  You develop new symptoms.  Your symptoms get worse. This information is not intended to replace advice given to you by your health care provider. Make sure you discuss any questions you have with your health care provider. Document Released: 02/19/2009 Document Revised: 09/30/2015 Document Reviewed: 08/17/2014 Elsevier Interactive Patient Education  2018 [employer]  
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
## 2017-10-16 ENCOUNTER — Other Ambulatory Visit: Payer: Self-pay | Admitting: Physician Assistant

## 2017-10-16 DIAGNOSIS — R05 Cough: Secondary | ICD-10-CM

## 2017-10-16 DIAGNOSIS — R059 Cough, unspecified: Secondary | ICD-10-CM

## 2017-10-16 DIAGNOSIS — F419 Anxiety disorder, unspecified: Secondary | ICD-10-CM

## 2017-10-22 ENCOUNTER — Encounter: Payer: Self-pay | Admitting: Physical Therapy

## 2017-10-22 ENCOUNTER — Ambulatory Visit: Payer: BLUE CROSS/BLUE SHIELD | Attending: Physician Assistant | Admitting: Physical Therapy

## 2017-10-22 DIAGNOSIS — M6281 Muscle weakness (generalized): Secondary | ICD-10-CM | POA: Diagnosis not present

## 2017-10-22 DIAGNOSIS — IMO0002 Reserved for concepts with insufficient information to code with codable children: Secondary | ICD-10-CM

## 2017-10-22 DIAGNOSIS — G8929 Other chronic pain: Secondary | ICD-10-CM | POA: Diagnosis not present

## 2017-10-22 DIAGNOSIS — M25512 Pain in left shoulder: Secondary | ICD-10-CM | POA: Diagnosis not present

## 2017-10-22 DIAGNOSIS — M2560 Stiffness of unspecified joint, not elsewhere classified: Secondary | ICD-10-CM | POA: Insufficient documentation

## 2017-10-22 NOTE — Patient Instructions (Signed)
Access Code: [email or web address]  URL: [email or web address]  Date: 10/22/2017  Prepared by: Dorcas Carrow   Exercises  Seated Shoulder Flexion AAROM with Pulley Behind - 20 reps - 1 sets - 2 hold - 2x daily - 7x weekly  Seated Shoulder Abduction AAROM with Pulley Behind - 20 reps - 1 sets - 2 hold - 2x daily - 7x weekly  Seated Cervical Sidebending Stretch - 5 reps - 2 sets - 15 hold - 2x daily - 7x weekly  Supine Shoulder Flexion Extension AAROM with Dowel - 10 reps - 3 sets - 2 hold - 1x daily - 7x weekly  Supine Shoulder Press with Dowel - 10 reps - 3 sets - 2 hold - 1x daily - 7x weekly  Doorway Pec Stretch at 90 Degrees Abduction - 3 reps - 1 sets - 20 hold - 2x daily - 7x weekly
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
## 2017-10-24 ENCOUNTER — Encounter: Payer: Self-pay | Admitting: Physician Assistant

## 2017-10-24 ENCOUNTER — Ambulatory Visit (INDEPENDENT_AMBULATORY_CARE_PROVIDER_SITE_OTHER): Payer: BLUE CROSS/BLUE SHIELD | Admitting: Physician Assistant

## 2017-10-24 VITALS — BP 128/90 | HR 72 | Temp 98.3°F | Resp 16 | Ht 63.0 in | Wt 147.0 lb

## 2017-10-24 DIAGNOSIS — F0781 Postconcussional syndrome: Secondary | ICD-10-CM | POA: Diagnosis not present

## 2017-10-24 DIAGNOSIS — R42 Dizziness and giddiness: Secondary | ICD-10-CM

## 2017-10-24 MED ORDER — MECLIZINE HCL 25 MG PO TABS: 25.0000 mg | ORAL_TABLET | Freq: Three times a day (TID) | ORAL | 0 refills | Status: DC | PRN
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
## 2017-10-24 NOTE — Patient Instructions (Signed)
Post-Concussion Syndrome Post-concussion syndrome is the symptoms that can occur after a head injury. These symptoms can last from weeks to months. Follow these instructions at home:  Take medicines only as told by your doctor.  Do not take aspirin.  Sleep with your head raised to help with headaches.  Avoid activities that can cause another head injury. ? Do not play contact sports like football, hockey, soccer, or basketball. ? Do not do other risky activities like downhill skiing, martial arts, or horseback riding until your doctor says it is okay.  Keep all follow-up visits as told by your doctor. This is important. Contact a doctor if:  You have a harder time: ? Paying attention. ? Focusing. ? Remembering. ? Learning new information. ? Dealing with stress.  You need more time to complete tasks.  You are easily bothered (irritable).  You have more symptoms. Get help if you have any of these symptoms for more than two weeks after your injury:  Long-lasting (chronic) headaches.  Dizziness.  Trouble balancing.  Feeling sick to your stomach (nauseous).  Trouble with your vision.  Noise or light bothers you more.  Depression.  Mood swings.  Feeling worried (anxious).  Easily bothered.  Memory problems.  Trouble concentrating or paying attention.  Sleep problems.  Feeling tired all of the time.  Get help right away if:  You feel confused.  You feel very sleepy.  You are hard to wake up.  You feel sick to your stomach.  You keep throwing up (vomiting).  You feel like you are moving when you are not (vertigo).  Your eyes move back and forth very quickly.  You start shaking (convulsing) or pass out (faint).  You have very bad headaches that do not get better with medicine.  You cannot use your arms or legs like normal.  One of the black centers of your eyes (pupils) is bigger than the other.  You have clear or bloody fluid coming from your  nose or ears.  Your problems get worse, not better. This information is not intended to replace advice given to you by your health care provider. Make sure you discuss any questions you have with your health care provider. Document Released: 06/01/2004 Document Revised: 09/30/2015 Document Reviewed: 07/30/2013 Elsevier Interactive Patient Education  2018 [employer]  

## 2017-10-25 ENCOUNTER — Ambulatory Visit: Payer: BLUE CROSS/BLUE SHIELD

## 2017-10-25 DIAGNOSIS — M6281 Muscle weakness (generalized): Secondary | ICD-10-CM

## 2017-10-25 DIAGNOSIS — G8929 Other chronic pain: Secondary | ICD-10-CM

## 2017-10-25 DIAGNOSIS — M2560 Stiffness of unspecified joint, not elsewhere classified: Secondary | ICD-10-CM | POA: Diagnosis not present

## 2017-10-25 DIAGNOSIS — M25512 Pain in left shoulder: Principal | ICD-10-CM

## 2017-10-25 DIAGNOSIS — IMO0002 Reserved for concepts with insufficient information to code with codable children: Secondary | ICD-10-CM
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
## 2017-10-29 ENCOUNTER — Other Ambulatory Visit: Payer: Self-pay | Admitting: Physician Assistant

## 2017-10-29 ENCOUNTER — Telehealth: Payer: Self-pay | Admitting: *Deleted

## 2017-10-29 DIAGNOSIS — L304 Erythema intertrigo: Secondary | ICD-10-CM

## 2017-10-29 NOTE — Telephone Encounter (Signed)
Patient returned call

## 2017-10-30 ENCOUNTER — Encounter: Payer: Self-pay | Admitting: Physical Therapy

## 2017-10-30 ENCOUNTER — Ambulatory Visit: Payer: BLUE CROSS/BLUE SHIELD

## 2017-10-30 DIAGNOSIS — M6281 Muscle weakness (generalized): Secondary | ICD-10-CM

## 2017-10-30 DIAGNOSIS — M25512 Pain in left shoulder: Secondary | ICD-10-CM | POA: Diagnosis not present

## 2017-10-30 DIAGNOSIS — G8929 Other chronic pain: Secondary | ICD-10-CM

## 2017-10-30 DIAGNOSIS — M2560 Stiffness of unspecified joint, not elsewhere classified: Secondary | ICD-10-CM

## 2017-10-30 DIAGNOSIS — IMO0002 Reserved for concepts with insufficient information to code with codable children: Secondary | ICD-10-CM
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
## 2017-11-01 ENCOUNTER — Encounter: Payer: Self-pay | Admitting: Physical Therapy

## 2017-11-01 ENCOUNTER — Ambulatory Visit: Payer: BLUE CROSS/BLUE SHIELD | Admitting: Physical Therapy

## 2017-11-01 DIAGNOSIS — M6281 Muscle weakness (generalized): Secondary | ICD-10-CM | POA: Diagnosis not present

## 2017-11-01 DIAGNOSIS — M2560 Stiffness of unspecified joint, not elsewhere classified: Secondary | ICD-10-CM

## 2017-11-01 DIAGNOSIS — G44329 Chronic post-traumatic headache, not intractable: Secondary | ICD-10-CM | POA: Diagnosis not present

## 2017-11-01 DIAGNOSIS — F0781 Postconcussional syndrome: Secondary | ICD-10-CM | POA: Insufficient documentation

## 2017-11-01 DIAGNOSIS — G8929 Other chronic pain: Secondary | ICD-10-CM | POA: Diagnosis not present

## 2017-11-01 DIAGNOSIS — M25512 Pain in left shoulder: Principal | ICD-10-CM

## 2017-11-01 DIAGNOSIS — R42 Dizziness and giddiness: Secondary | ICD-10-CM | POA: Insufficient documentation

## 2017-11-01 DIAGNOSIS — IMO0002 Reserved for concepts with insufficient information to code with codable children: Secondary | ICD-10-CM

## 2017-11-01 NOTE — Patient Instructions (Signed)
Access Code: [email or web address]  URL: [email or web address]  Date: 11/01/2017  Prepared by: Dorcas Carrow   Exercises  Scapular Retraction with Resistance - 15 reps - 2 sets - 1x daily - 7x weekly  Supine Cervical Retraction with Towel - 10 reps - 1 sets - 5 hold - 2x daily - 7x weekly  Seated Cervical Retraction - 10 reps - 1 sets - 5 hold - 2x daily - 7x weekly
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
## 2017-11-02 ENCOUNTER — Telehealth: Payer: Self-pay | Admitting: Family Medicine

## 2017-11-02 NOTE — Telephone Encounter (Signed)
LMTCB

## 2017-11-02 NOTE — Telephone Encounter (Signed)
No answer-According to the letter-She will not be cleared for work until Neurology has cleared her.

## 2017-11-02 NOTE — Telephone Encounter (Signed)
Patient returned call. Patient also mentions that she has a follow up app with Neuro on 11/15/2017.

## 2017-11-02 NOTE — Telephone Encounter (Signed)
pt wants to know the date that is on her note to return to work from the last visit that she was in to see Sonia Baller.  CB # is [phone]  [employer]

## 2017-11-05 ENCOUNTER — Other Ambulatory Visit: Payer: Self-pay | Admitting: Physician Assistant

## 2017-11-05 DIAGNOSIS — B372 Candidiasis of skin and nail: Secondary | ICD-10-CM

## 2017-11-06 ENCOUNTER — Encounter: Payer: Self-pay | Admitting: Physical Therapy

## 2017-11-06 ENCOUNTER — Ambulatory Visit: Payer: BLUE CROSS/BLUE SHIELD | Attending: Physician Assistant | Admitting: Physical Therapy

## 2017-11-06 DIAGNOSIS — M6281 Muscle weakness (generalized): Secondary | ICD-10-CM | POA: Diagnosis not present

## 2017-11-06 DIAGNOSIS — M25512 Pain in left shoulder: Secondary | ICD-10-CM | POA: Diagnosis not present

## 2017-11-06 DIAGNOSIS — M2560 Stiffness of unspecified joint, not elsewhere classified: Secondary | ICD-10-CM | POA: Diagnosis not present

## 2017-11-06 DIAGNOSIS — G8929 Other chronic pain: Secondary | ICD-10-CM | POA: Diagnosis not present

## 2017-11-06 DIAGNOSIS — IMO0002 Reserved for concepts with insufficient information to code with codable children: Secondary | ICD-10-CM
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
## 2017-11-09 NOTE — Telephone Encounter (Signed)
LM to let her know the Neurologist would cleared her.  Thanks,  -Ethne Jeon

## 2017-11-15 ENCOUNTER — Encounter: Payer: Self-pay | Admitting: Physical Therapy

## 2017-11-15 ENCOUNTER — Ambulatory Visit: Payer: BLUE CROSS/BLUE SHIELD | Admitting: Physical Therapy

## 2017-11-15 DIAGNOSIS — F0781 Postconcussional syndrome: Secondary | ICD-10-CM | POA: Diagnosis not present

## 2017-11-15 DIAGNOSIS — IMO0002 Reserved for concepts with insufficient information to code with codable children: Secondary | ICD-10-CM

## 2017-11-15 DIAGNOSIS — M25512 Pain in left shoulder: Principal | ICD-10-CM

## 2017-11-15 DIAGNOSIS — G44329 Chronic post-traumatic headache, not intractable: Secondary | ICD-10-CM | POA: Diagnosis not present

## 2017-11-15 DIAGNOSIS — M2560 Stiffness of unspecified joint, not elsewhere classified: Secondary | ICD-10-CM | POA: Diagnosis not present

## 2017-11-15 DIAGNOSIS — G8929 Other chronic pain: Secondary | ICD-10-CM | POA: Diagnosis not present

## 2017-11-15 DIAGNOSIS — M6281 Muscle weakness (generalized): Secondary | ICD-10-CM | POA: Diagnosis not present

## 2017-11-15 DIAGNOSIS — R42 Dizziness and giddiness: Secondary | ICD-10-CM | POA: Diagnosis not present
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
## 2017-11-15 NOTE — Patient Instructions (Signed)
Access Code: [email or web address]  URL: [email or web address]  Date: 11/15/2017  Prepared by: Dorcas Carrow   Exercises  Standing Shoulder Row with Anchored Resistance - 10 reps - 2 sets - 1x daily - 7x weekly  Standing Shoulder Internal Rotation with Anchored Resistance - 10 reps - 2 sets - 1x daily - 7x weekly  Shoulder External Rotation Reactive Isometrics - 10 reps - 2 sets - 1x daily - 7x weekly  Supine PNF D2 Flexion with Resistance - 10 reps - 2 sets - 1x daily - 7x weekly

## 2017-11-16 ENCOUNTER — Telehealth: Payer: Self-pay | Admitting: Family Medicine

## 2017-11-16 DIAGNOSIS — L304 Erythema intertrigo: Secondary | ICD-10-CM

## 2017-11-16 MED ORDER — FLUCONAZOLE 150 MG PO TABS: 150.0000 mg | ORAL_TABLET | Freq: Every day | ORAL | 0 refills | Status: DC

## 2017-11-16 NOTE — Telephone Encounter (Signed)
Will try Diflucan. This has been sent in

## 2017-11-16 NOTE — Telephone Encounter (Signed)
Please Review.  Thanks,  -Artavious Trebilcock 

## 2017-11-16 NOTE — Telephone Encounter (Signed)
Pt states she has a rash under her breast that she get 2 creams for.  She is using the cream but still having problems with the rash  and wants to know if there is anything else she can try.  Pt's cb # is [phone]  Thanks Con Memos

## 2017-11-16 NOTE — Telephone Encounter (Signed)
Patient advised.  Thanks,  -Gina [name] 

## 2017-11-20 ENCOUNTER — Telehealth: Payer: Self-pay | Admitting: Physician Assistant

## 2017-11-20 DIAGNOSIS — L304 Erythema intertrigo: Secondary | ICD-10-CM

## 2017-11-20 NOTE — Telephone Encounter (Signed)
Pt stated that she took fluconazole (DIFLUCAN) 150 MG tablet as directed but still has the redness under her breast. Pt is requesting a call back to discuss if she should try something else. Please advise. Thanks TNP

## 2017-11-21 MED ORDER — DOXYCYCLINE HYCLATE 100 MG PO TABS: 100.0000 mg | ORAL_TABLET | Freq: Two times a day (BID) | ORAL | 0 refills | Status: DC

## 2017-11-21 NOTE — Telephone Encounter (Signed)
Per patient it didn't help at all.  Thanks,  -Kaylynne Andres

## 2017-11-21 NOTE — Telephone Encounter (Signed)
Lets try an antibiotic. If it still does not respond she will need referral to dermatology

## 2017-11-21 NOTE — Telephone Encounter (Signed)
Please Review

## 2017-11-21 NOTE — Telephone Encounter (Signed)
Did it seem to improve at all with fluconazole (diflucan)?

## 2017-11-22 ENCOUNTER — Ambulatory Visit: Payer: BLUE CROSS/BLUE SHIELD | Admitting: Physical Therapy

## 2017-11-22 ENCOUNTER — Encounter: Payer: Self-pay | Admitting: Physical Therapy

## 2017-11-22 DIAGNOSIS — IMO0002 Reserved for concepts with insufficient information to code with codable children: Secondary | ICD-10-CM

## 2017-11-22 DIAGNOSIS — M2560 Stiffness of unspecified joint, not elsewhere classified: Secondary | ICD-10-CM

## 2017-11-22 DIAGNOSIS — M25512 Pain in left shoulder: Principal | ICD-10-CM

## 2017-11-22 DIAGNOSIS — M6281 Muscle weakness (generalized): Secondary | ICD-10-CM

## 2017-11-22 DIAGNOSIS — G8929 Other chronic pain: Secondary | ICD-10-CM

## 2017-11-22 NOTE — Telephone Encounter (Signed)
Patient advised.
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
## 2017-11-28 ENCOUNTER — Telehealth: Payer: Self-pay | Admitting: Family Medicine

## 2017-11-28 DIAGNOSIS — L304 Erythema intertrigo: Secondary | ICD-10-CM

## 2017-11-28 MED ORDER — FLUCONAZOLE 150 MG PO TABS: 150.0000 mg | ORAL_TABLET | Freq: Every day | ORAL | 0 refills | Status: DC

## 2017-11-28 NOTE — Telephone Encounter (Signed)
Please Review

## 2017-11-28 NOTE — Telephone Encounter (Signed)
Would recommend interdry pads (can be bought over the counter).   Would recommend dermatology referral at this time if she is agreeable since she has failed treatments. I will send in some more diflucan for her as well for now.

## 2017-11-28 NOTE — Telephone Encounter (Signed)
Pt states she still has a rash under her breast.  States it has improved some for a day or two but when she gets hot or sweats it gets extremely red and gets worse.

## 2017-11-29 DIAGNOSIS — G44329 Chronic post-traumatic headache, not intractable: Secondary | ICD-10-CM | POA: Diagnosis not present

## 2017-11-29 DIAGNOSIS — R42 Dizziness and giddiness: Secondary | ICD-10-CM | POA: Diagnosis not present

## 2017-11-29 DIAGNOSIS — F0781 Postconcussional syndrome: Secondary | ICD-10-CM | POA: Diagnosis not present

## 2017-11-29 NOTE — Telephone Encounter (Signed)
Per patient she is going to try the interdry pads first and that she will call if she decides to go with the referral.  Thanks,  -Joseline

## 2017-11-29 NOTE — Telephone Encounter (Signed)
OK 

## 2017-12-03 ENCOUNTER — Encounter: Payer: BLUE CROSS/BLUE SHIELD | Admitting: Physical Therapy

## 2017-12-08 ENCOUNTER — Other Ambulatory Visit: Payer: Self-pay | Admitting: Physician Assistant

## 2017-12-11 ENCOUNTER — Other Ambulatory Visit: Payer: Self-pay | Admitting: Physician Assistant

## 2017-12-11 DIAGNOSIS — R1310 Dysphagia, unspecified: Secondary | ICD-10-CM

## 2017-12-20 DIAGNOSIS — R42 Dizziness and giddiness: Secondary | ICD-10-CM | POA: Diagnosis not present

## 2017-12-20 DIAGNOSIS — G44329 Chronic post-traumatic headache, not intractable: Secondary | ICD-10-CM | POA: Diagnosis not present

## 2017-12-20 DIAGNOSIS — F0781 Postconcussional syndrome: Secondary | ICD-10-CM | POA: Diagnosis not present

## 2018-01-09 DIAGNOSIS — G44329 Chronic post-traumatic headache, not intractable: Secondary | ICD-10-CM | POA: Diagnosis not present

## 2018-01-25 ENCOUNTER — Ambulatory Visit (INDEPENDENT_AMBULATORY_CARE_PROVIDER_SITE_OTHER): Payer: BLUE CROSS/BLUE SHIELD | Admitting: Physician Assistant

## 2018-01-25 ENCOUNTER — Encounter: Payer: Self-pay | Admitting: Physician Assistant

## 2018-01-25 VITALS — BP 140/90 | HR 60 | Temp 97.8°F | Resp 16 | Wt 149.2 lb

## 2018-01-25 DIAGNOSIS — F0781 Postconcussional syndrome: Secondary | ICD-10-CM | POA: Diagnosis not present

## 2018-01-25 DIAGNOSIS — Z23 Encounter for immunization: Secondary | ICD-10-CM

## 2018-01-25 DIAGNOSIS — F320 Major depressive disorder, single episode, mild: Secondary | ICD-10-CM | POA: Diagnosis not present

## 2018-01-25 DIAGNOSIS — S0990XA Unspecified injury of head, initial encounter: Secondary | ICD-10-CM

## 2018-01-25 DIAGNOSIS — R413 Other amnesia: Secondary | ICD-10-CM

## 2018-01-25 DIAGNOSIS — F419 Anxiety disorder, unspecified: Secondary | ICD-10-CM | POA: Diagnosis not present

## 2018-01-25 NOTE — Progress Notes (Signed)
Patient: Gina [name] Female    DOB: 08-30-1955   62 y.o.   MRN: [id number] Visit Date: 01/25/2018  Today's Provider: Mar Daring, PA-C   Chief Complaint  Patient presents with  . Follow-up   Subjective:    HPI Patient here to follow-up on Post-concussive syndrome. Patient needed to follow back in July but didn't since she was seeing Neurology. She reports that the Neurology took her out of work on 01/14/18 for 3 more weeks. She reports she is forgetting little things, like her keys in the door yesterday. She is still seeing Neurology, Dr. Melrose Nakayama, and recently had an occipital nerve block on 01/09/18 for chronic headaches with Dr. Manuella Ghazi. She is scheduled to see Neurology again on 01/31/18. Overall she does feel she has been improving, just slowly. Stress triggers her and causes her to be more forgetful. She is trying to take her time and go slowly with things. Tries not to multitask. Reports being fearful of simple things such as cooking as she is worried she will forget and leave something on and cause a fire.      Allergies  Allergen Reactions  . Omeprazole Rash    Previously tolerated Prevacid, so do not feel like this is "class wide"     Current Outpatient Medications:  .  acetaminophen (TYLENOL) 325 MG tablet, Take 2 tablets (650 mg total) by mouth every 6 (six) hours as needed., Disp: 30 tablet, Rfl: 0 .  ALPRAZolam (XANAX) 0.5 MG tablet, TAKE 1 TABLET BY MOUTH EVERY DAY AND 1 TABLET AT BEDTIME, Disp: 180 tablet, Rfl: 1 .  amLODipine (NORVASC) 5 MG tablet, TAKE 1 TABLET BY MOUTH EVERY DAY (Patient taking differently: Take 5 mg by mouth at bedtime), Disp: 90 tablet, Rfl: 3 .  azelastine (ASTELIN) 0.1 % nasal spray, Place 1 spray into both nostrils 2 (two) times daily. Use in each nostril as directed (Patient taking differently: Place 1 spray into both nostrils 2 (two) times daily. Instill 1 spray into each nostril two times a day, every other day), Disp: 30 mL, Rfl:  12 .  cetirizine (ZYRTEC) 10 MG tablet, Take 10 mg by mouth See admin instructions. Take 10 mg by mouth every other night, Disp: , Rfl:  .  clotrimazole-betamethasone (LOTRISONE) cream, Apply 1 application topically 2 (two) times daily., Disp: 45 g, Rfl: 1 .  diphenhydrAMINE (BENADRYL) 25 mg capsule, Take 25 mg by mouth at bedtime., Disp: , Rfl:  .  fluticasone (FLONASE) 50 MCG/ACT nasal spray, SPRAY 2 SPRAYS INTO EACH NOSTRIL EVERY DAY (Patient taking differently: Instill 2 sprays into each nsotril every other day), Disp: 48 g, Rfl: 1 .  gabapentin (NEURONTIN) 100 MG capsule, Take 200 mg by mouth 2 (two) times daily., Disp: , Rfl: 3 .  ibuprofen (ADVIL,MOTRIN) 600 MG tablet, TAKE 1 TABLET BY MOUTH EVERY 6 HOURS AS NEEDED, Disp: 90 tablet, Rfl: 1 .  montelukast (SINGULAIR) 10 MG tablet, TAKE 1 TABLET BY MOUTH EVERY DAY (Patient taking differently: Take 10 mg by mouth every other night), Disp: 90 tablet, Rfl: 3 .  nortriptyline (PAMELOR) 10 MG capsule, TAKE 4 CAPSULES BY MOUTH AT NIGHT, Disp: , Rfl: 3 .  nystatin cream (MYCOSTATIN), APPLY TO AFFECTED AREA TWICE A DAY (Patient taking differently: Apply topically as needed. ), Disp: 30 g, Rfl: 0 .  pantoprazole (PROTONIX) 40 MG tablet, TAKE 1 TABLET BY MOUTH EVERY DAY, Disp: 90 tablet, Rfl: 1 .  B COMPLEX VITAMINS PO, Take  1 tablet by mouth daily., Disp: , Rfl:  .  benzonatate (TESSALON) 200 MG capsule, TAKE 1 CAPSULE BY MOUTH EVERY DAY 3 TIMES A DAY AS NEEDED FOR COUGH (Patient not taking: Reported on 01/25/2018), Disp: 30 capsule, Rfl: 0 .  Cholecalciferol (VITAMIN D3) 1000 UNITS CAPS, Take 1,000 Units by mouth at bedtime. , Disp: , Rfl:  .  fluconazole (DIFLUCAN) 150 MG tablet, Take 1 tablet (150 mg total) by mouth daily. (Patient not taking: Reported on 01/25/2018), Disp: 3 tablet, Rfl: 0 .  ketoconazole (NIZORAL) 2 % cream, APPLY TO AFFECTED AREA TWICE A DAY (Patient not taking: Reported on 01/25/2018), Disp: 60 g, Rfl: 0 .  meclizine (ANTIVERT) 25 MG  tablet, Take 1 tablet (25 mg total) by mouth 3 (three) times daily as needed for dizziness. (Patient not taking: Reported on 01/25/2018), Disp: 30 tablet, Rfl: 0 .  mupirocin ointment (BACTROBAN) 2 %, Place 1 application into the nose 2 (two) times daily. (Patient not taking: Reported on 01/25/2018), Disp: 22 g, Rfl: 0 .  triamcinolone cream (KENALOG) 0.1 %, APPLY 1 APPLICATION TOPICALLY 2 (TWO) TIMES DAILY. (Patient not taking: Reported on 01/25/2018), Disp: 80 g, Rfl: 0  Review of Systems  Constitutional: Negative.   Respiratory: Negative.   Cardiovascular: Negative.   Gastrointestinal: Negative.   Musculoskeletal: Negative.   Neurological: Positive for headaches ("some"). Negative for dizziness.       Memory  Psychiatric/Behavioral: Positive for agitation, decreased concentration and dysphoric mood. Negative for self-injury, sleep disturbance and suicidal ideas. The patient is nervous/anxious.     Social History   Tobacco Use  . Smoking status: Never Smoker  . Smokeless tobacco: Never Used  Substance Use Topics  . Alcohol use: No    Alcohol/week: 0.0 standard drinks   Objective:   BP 140/90 (BP Location: Left Arm, Patient Position: Sitting, Cuff Size: Normal)   Pulse 60   Temp 97.8 F (36.6 C) (Oral)   Resp 16   Wt 149 lb 3.2 oz (67.7 kg)   BMI 26.43 kg/m  Vitals:   01/25/18 1511  BP: 140/90  Pulse: 60  Resp: 16  Temp: 97.8 F (36.6 C)  TempSrc: Oral  Weight: 149 lb 3.2 oz (67.7 kg)     Physical Exam  Constitutional: She appears well-developed and well-nourished. No distress.  Neck: Normal range of motion. Neck supple. No JVD present. No tracheal deviation present. No thyromegaly present.  Cardiovascular: Normal rate, regular rhythm and normal heart sounds. Exam reveals no gallop and no friction rub.  No murmur heard. Pulmonary/Chest: Effort normal and breath sounds normal. No respiratory distress. She has no wheezes. She has no rales.  Lymphadenopathy:    She has  no cervical adenopathy.  Skin: She is not diaphoretic.  Psychiatric: Her speech is normal and behavior is normal. Judgment and thought content normal. Her mood appears anxious. Cognition and memory are normal.  Vitals reviewed.      Assessment & Plan:     1. Post concussive syndrome Followed by Neurology. Slowly improving. Will see Neurology next week in f/u.   2. Memory dysfunction following head trauma See above medical treatment plan.  3. Current mild episode of major depressive disorder without prior episode ([place]) Suspect worsening depression and anxiety from the concussion. Patient is fearful to even do simple tasks she used to do daily. Discussed importance of building her confidence in herself and to start adding daily tasks back in to her routine while she is out of work again.  Discussed starting with laundry, then possibly adding a daily walk, then adding in simple cooking. She is in agreement. Currently she is on Nortriptyline for post-concussive syndrome. If symptoms continue to progress may be beneficial to change to an SSRI for better anxiety control. Continue alprazolam 0.5mg  q hs prn. I will see her back in 2-3 months. Call if worsening to be seen sooner.   4. Anxiety See above medical treatment plan.  5. Influenza vaccine needed Flu vaccine given today without complication. Patient sat upright for 15 minutes to check for adverse reaction before being released. - Flu Vaccine QUAD 36+ mos IM   I spent approximately 60 minutes with the patient today. Over 50% of this time was spent with counseling and educating the patient.      Mar Daring, PA-C  [facility]

## 2018-01-28 ENCOUNTER — Telehealth: Payer: Self-pay

## 2018-01-28 NOTE — Telephone Encounter (Signed)
Nothing attached for reason of call

## 2018-01-28 NOTE — Telephone Encounter (Signed)
Patient called requesting appt due to vaginal bleeding that started Saturday night. Patient reports she bleed when she is active, pt denies any severe bleeding. Patient denies any abdominal pain. Patient appt 01/29/18 at 9:40 am.

## 2018-01-29 ENCOUNTER — Encounter: Payer: Self-pay | Admitting: Physician Assistant

## 2018-01-29 ENCOUNTER — Ambulatory Visit (INDEPENDENT_AMBULATORY_CARE_PROVIDER_SITE_OTHER): Payer: BLUE CROSS/BLUE SHIELD | Admitting: Physician Assistant

## 2018-01-29 VITALS — BP 126/82 | HR 60 | Temp 97.7°F | Wt 148.0 lb

## 2018-01-29 DIAGNOSIS — R87610 Atypical squamous cells of undetermined significance on cytologic smear of cervix (ASC-US): Secondary | ICD-10-CM | POA: Diagnosis not present

## 2018-01-29 DIAGNOSIS — N841 Polyp of cervix uteri: Secondary | ICD-10-CM

## 2018-01-29 DIAGNOSIS — J0140 Acute pansinusitis, unspecified: Secondary | ICD-10-CM | POA: Diagnosis not present

## 2018-01-29 DIAGNOSIS — N950 Postmenopausal bleeding: Secondary | ICD-10-CM | POA: Diagnosis not present

## 2018-01-29 MED ORDER — AZITHROMYCIN 250 MG PO TABS: ORAL_TABLET | ORAL | 0 refills | Status: DC

## 2018-01-29 NOTE — Progress Notes (Signed)
Patient: Gina [name] Female    DOB: 16-Sep-1955   62 y.o.   MRN: [id number] Visit Date: 01/29/2018  Today's Provider: Mar Daring, PA-C   Chief Complaint  Patient presents with  . Vaginal Bleeding    Started about 3-4 days ago.   Subjective:    Vaginal Bleeding  The patient's primary symptoms include vaginal bleeding. The patient's pertinent negatives include no pelvic pain or vaginal discharge. This is a new problem. The current episode started in the past 7 days. She is not pregnant. Associated symptoms include back pain and headaches. Pertinent negatives include no abdominal pain, chills, dysuria, fever, flank pain, frequency, hematuria, joint swelling, nausea, sore throat, urgency or vomiting. The vaginal bleeding is lighter than menses. She has not been passing clots. She has not been passing tissue.   Patient had Pap in 07/2017. Was positive for ASCUS but HPV negative. Does have h/o mucinous polyp of cervix. Has been postmenopausal for "almost 10 years." No previous postmenopausal bleeding. Bleeding is bright red with some mucous intermixed.    Allergies  Allergen Reactions  . Omeprazole Rash    Previously tolerated Prevacid, so do not feel like this is "class wide"     Current Outpatient Medications:  .  acetaminophen (TYLENOL) 325 MG tablet, Take 2 tablets (650 mg total) by mouth every 6 (six) hours as needed., Disp: 30 tablet, Rfl: 0 .  ALPRAZolam (XANAX) 0.5 MG tablet, TAKE 1 TABLET BY MOUTH EVERY DAY AND 1 TABLET AT BEDTIME, Disp: 180 tablet, Rfl: 1 .  amLODipine (NORVASC) 5 MG tablet, TAKE 1 TABLET BY MOUTH EVERY DAY (Patient taking differently: Take 5 mg by mouth at bedtime), Disp: 90 tablet, Rfl: 3 .  azelastine (ASTELIN) 0.1 % nasal spray, Place 1 spray into both nostrils 2 (two) times daily. Use in each nostril as directed (Patient taking differently: Place 1 spray into both nostrils 2 (two) times daily. Instill 1 spray into each nostril two times a  day, every other day), Disp: 30 mL, Rfl: 12 .  B COMPLEX VITAMINS PO, Take 1 tablet by mouth daily., Disp: , Rfl:  .  cetirizine (ZYRTEC) 10 MG tablet, Take 10 mg by mouth See admin instructions. Take 10 mg by mouth every other night, Disp: , Rfl:  .  Cholecalciferol (VITAMIN D3) 1000 UNITS CAPS, Take 1,000 Units by mouth at bedtime. , Disp: , Rfl:  .  clotrimazole-betamethasone (LOTRISONE) cream, Apply 1 application topically 2 (two) times daily., Disp: 45 g, Rfl: 1 .  diphenhydrAMINE (BENADRYL) 25 mg capsule, Take 25 mg by mouth at bedtime., Disp: , Rfl:  .  fluticasone (FLONASE) 50 MCG/ACT nasal spray, SPRAY 2 SPRAYS INTO EACH NOSTRIL EVERY DAY (Patient taking differently: Instill 2 sprays into each nsotril every other day), Disp: 48 g, Rfl: 1 .  gabapentin (NEURONTIN) 100 MG capsule, Take 200 mg by mouth 2 (two) times daily., Disp: , Rfl: 3 .  ibuprofen (ADVIL,MOTRIN) 600 MG tablet, TAKE 1 TABLET BY MOUTH EVERY 6 HOURS AS NEEDED, Disp: 90 tablet, Rfl: 1 .  montelukast (SINGULAIR) 10 MG tablet, TAKE 1 TABLET BY MOUTH EVERY DAY (Patient taking differently: Take 10 mg by mouth every other night), Disp: 90 tablet, Rfl: 3 .  nortriptyline (PAMELOR) 10 MG capsule, TAKE 4 CAPSULES BY MOUTH AT NIGHT, Disp: , Rfl: 3 .  nystatin cream (MYCOSTATIN), APPLY TO AFFECTED AREA TWICE A DAY (Patient taking differently: Apply topically as needed. ), Disp: 30 g, Rfl:  0 .  pantoprazole (PROTONIX) 40 MG tablet, TAKE 1 TABLET BY MOUTH EVERY DAY, Disp: 90 tablet, Rfl: 1  Review of Systems  Constitutional: Positive for diaphoresis. Negative for activity change, appetite change, chills, fatigue, fever and unexpected weight change.  HENT: Positive for congestion, postnasal drip, rhinorrhea and sinus pain. Negative for sore throat and trouble swallowing.   Respiratory: Negative.   Cardiovascular: Negative.   Gastrointestinal: Negative.  Negative for abdominal pain, nausea and vomiting.  Genitourinary: Positive for  vaginal bleeding. Negative for decreased urine volume, difficulty urinating, dyspareunia, dysuria, enuresis, flank pain, frequency, hematuria, menstrual problem, pelvic pain, urgency, vaginal discharge and vaginal pain.  Musculoskeletal: Positive for back pain.  Neurological: Positive for headaches. Negative for dizziness and light-headedness.    Social History   Tobacco Use  . Smoking status: Never Smoker  . Smokeless tobacco: Never Used  Substance Use Topics  . Alcohol use: No    Alcohol/week: 0.0 standard drinks   Objective:   BP 126/82 (BP Location: Right Arm, Patient Position: Sitting, Cuff Size: Normal)   Pulse 60   Temp 97.7 F (36.5 C) (Oral)   Wt 148 lb (67.1 kg)   SpO2 99%   BMI 26.22 kg/m  Vitals:   01/29/18 0941  BP: 126/82  Pulse: 60  Temp: 97.7 F (36.5 C)  TempSrc: Oral  SpO2: 99%  Weight: 148 lb (67.1 kg)     Physical Exam  Constitutional: She appears well-developed and well-nourished. No distress.  HENT:  Head: Normocephalic and atraumatic.  Right Ear: Hearing, tympanic membrane, external ear and ear canal normal.  Left Ear: Hearing, tympanic membrane, external ear and ear canal normal.  Nose: Right sinus exhibits maxillary sinus tenderness and frontal sinus tenderness. Left sinus exhibits maxillary sinus tenderness and frontal sinus tenderness.  Mouth/Throat: Uvula is midline, oropharynx is clear and moist and mucous membranes are normal. No oropharyngeal exudate.  Neck: Normal range of motion. Neck supple. No tracheal deviation present. No thyromegaly present.  Cardiovascular: Normal rate, regular rhythm and normal heart sounds. Exam reveals no gallop and no friction rub.  No murmur heard. Pulmonary/Chest: Effort normal and breath sounds normal. No stridor. No respiratory distress. She has no wheezes. She has no rales.  Abdominal: Soft. Bowel sounds are normal. She exhibits no distension and no mass. There is no tenderness. There is no rebound and no  guarding. No hernia. Hernia confirmed negative in the right inguinal area and confirmed negative in the left inguinal area.  Genitourinary: Vagina normal and uterus normal. Pelvic exam was performed with patient supine. There is no rash, tenderness, lesion or injury on the right labia. There is no rash, tenderness, lesion or injury on the left labia. Cervix exhibits discharge (bloody discharge). Cervix exhibits no motion tenderness and no friability. Right adnexum displays no mass, no tenderness and no fullness. Left adnexum displays no mass, no tenderness and no fullness. No erythema or tenderness in the vagina. No vaginal discharge found.  Lymphadenopathy:    She has no cervical adenopathy. No inguinal adenopathy noted on the right or left side.  Skin: She is not diaphoretic.  Vitals reviewed.       Assessment & Plan:     1. Postmenopausal vaginal bleeding Fairly normal exam today with exception of the postmenopausal bleeding. Scant bright red blood with some mucous noted from cervical os. Not enough to cover cotton tipped applicator. Will refer to GYN for further evaluation. Patient in agreement.  - Ambulatory referral to Gynecology  2.  Mucous polyp of cervix H/O this. Reports previous biopsy and D&C after the birth of her son. He is now in his 79s.  - Ambulatory referral to Gynecology  3. ASCUS of cervix with negative high risk HPV Noted on last pap in 07/2017.  - Ambulatory referral to Gynecology  4. Acute pansinusitis, recurrence not specified Worsening symptoms that have not responded to OTC medications. Will give Zpak as below. Continue allergy medications. Stay well hydrated and get plenty of rest. Call if no symptom improvement or if symptoms worsen. - azithromycin (ZITHROMAX) 250 MG tablet; Take 2 tablets PO on day one, and one tablet PO daily thereafter until completed.  Dispense: 6 tablet; Refill: 0       Mar Daring, PA-C  [facility]

## 2018-01-29 NOTE — Patient Instructions (Signed)
Postmenopausal Bleeding Postmenopausal bleeding is any bleeding after menopause. Menopause is when a woman's period stops. Any type of bleeding after menopause is concerning. It should be checked by your doctor. Any treatment will depend on the cause. Follow these instructions at home: Watch your condition for any changes.  Avoid the use of tampons and douches as told by your doctor.  Change your pads often.  Get regular pelvic exams and Pap tests.  Keep all appointments for tests as told by your doctor.  Contact a doctor if:  Your bleeding lasts for more than 1 week.  You have belly (abdominal) pain.  You have bleeding after sex (intercourse). Get help right away if:  You have a fever, chills, a headache, dizziness, muscle aches, and bleeding.  You have strong pain with bleeding.  You have clumps of blood (blood clots) coming from your vagina.  You have bleeding and need more than 1 pad an hour.  You feel like you are going to pass out (faint). This information is not intended to replace advice given to you by your health care provider. Make sure you discuss any questions you have with your health care provider. Document Released: 02/01/2008 Document Revised: 09/30/2015 Document Reviewed: 11/21/2012 Elsevier Interactive Patient Education  2017 [employer] Inc[employer]  

## 2018-01-30 ENCOUNTER — Encounter: Payer: Self-pay | Admitting: Physician Assistant

## 2018-01-30 ENCOUNTER — Encounter: Payer: Self-pay | Admitting: Obstetrics & Gynecology

## 2018-01-30 ENCOUNTER — Ambulatory Visit (INDEPENDENT_AMBULATORY_CARE_PROVIDER_SITE_OTHER): Payer: BLUE CROSS/BLUE SHIELD | Admitting: Obstetrics & Gynecology

## 2018-01-30 ENCOUNTER — Other Ambulatory Visit (HOSPITAL_COMMUNITY)
Admission: RE | Admit: 2018-01-30 | Discharge: 2018-01-30 | Disposition: A | Discharge status: Home / Self Care | Payer: BLUE CROSS/BLUE SHIELD | Source: Ambulatory Visit | Attending: Obstetrics & Gynecology | Admitting: Obstetrics & Gynecology

## 2018-01-30 VITALS — BP 130/90 | Ht 63.0 in | Wt 149.0 lb

## 2018-01-30 DIAGNOSIS — N950 Postmenopausal bleeding: Secondary | ICD-10-CM | POA: Insufficient documentation

## 2018-01-30 DIAGNOSIS — N858 Other specified noninflammatory disorders of uterus: Secondary | ICD-10-CM | POA: Diagnosis not present

## 2018-01-30 NOTE — Patient Instructions (Signed)
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
## 2018-01-31 DIAGNOSIS — G44329 Chronic post-traumatic headache, not intractable: Secondary | ICD-10-CM | POA: Diagnosis not present

## 2018-01-31 DIAGNOSIS — F0781 Postconcussional syndrome: Secondary | ICD-10-CM | POA: Diagnosis not present

## 2018-02-01 ENCOUNTER — Ambulatory Visit (INDEPENDENT_AMBULATORY_CARE_PROVIDER_SITE_OTHER): Payer: BLUE CROSS/BLUE SHIELD

## 2018-02-01 ENCOUNTER — Encounter: Payer: Self-pay | Admitting: Obstetrics & Gynecology

## 2018-02-01 ENCOUNTER — Ambulatory Visit (INDEPENDENT_AMBULATORY_CARE_PROVIDER_SITE_OTHER): Payer: BLUE CROSS/BLUE SHIELD | Admitting: Obstetrics & Gynecology

## 2018-02-01 VITALS — BP 140/90 | Ht 63.0 in | Wt 148.0 lb

## 2018-02-01 DIAGNOSIS — N950 Postmenopausal bleeding: Secondary | ICD-10-CM
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
## 2018-02-27 DIAGNOSIS — F0781 Postconcussional syndrome: Secondary | ICD-10-CM | POA: Diagnosis not present

## 2018-02-27 DIAGNOSIS — R51 Headache: Secondary | ICD-10-CM | POA: Diagnosis not present

## 2018-02-28 DIAGNOSIS — R519 Headache, unspecified: Secondary | ICD-10-CM | POA: Insufficient documentation

## 2018-03-01 DIAGNOSIS — F0781 Postconcussional syndrome: Secondary | ICD-10-CM | POA: Diagnosis not present

## 2018-03-01 DIAGNOSIS — H7582 Other specified disorders of left middle ear and mastoid in diseases classified elsewhere: Secondary | ICD-10-CM | POA: Diagnosis not present

## 2018-03-01 DIAGNOSIS — I6782 Cerebral ischemia: Secondary | ICD-10-CM | POA: Diagnosis not present

## 2018-03-01 DIAGNOSIS — R51 Headache: Secondary | ICD-10-CM | POA: Diagnosis not present

## 2018-03-26 ENCOUNTER — Telehealth: Payer: Self-pay | Admitting: Family Medicine

## 2018-03-26 DIAGNOSIS — B49 Unspecified mycosis: Secondary | ICD-10-CM

## 2018-03-26 DIAGNOSIS — B372 Candidiasis of skin and nail: Secondary | ICD-10-CM

## 2018-03-26 NOTE — Telephone Encounter (Signed)
Pt is calling about the rash she continually gets around her breast.  She says Tawanna Sat calls in the cream medication to put on it.  Please advise.  Thanks, [employer]

## 2018-03-27 MED ORDER — CLOTRIMAZOLE-BETAMETHASONE 1-0.05 % EX CREA: 1.0000 "application " | TOPICAL_CREAM | Freq: Two times a day (BID) | CUTANEOUS | 1 refills | Status: DC

## 2018-03-27 MED ORDER — NYSTATIN 100000 UNIT/GM EX CREA: TOPICAL_CREAM | CUTANEOUS | 0 refills | Status: DC

## 2018-03-27 NOTE — Telephone Encounter (Signed)
Please Review

## 2018-03-27 NOTE — Telephone Encounter (Signed)
Refilled for her 

## 2018-04-08 ENCOUNTER — Other Ambulatory Visit: Payer: Self-pay | Admitting: Physician Assistant

## 2018-04-08 DIAGNOSIS — G44329 Chronic post-traumatic headache, not intractable: Secondary | ICD-10-CM | POA: Diagnosis not present

## 2018-04-08 DIAGNOSIS — I10 Essential (primary) hypertension: Secondary | ICD-10-CM

## 2018-04-08 DIAGNOSIS — R42 Dizziness and giddiness: Secondary | ICD-10-CM | POA: Diagnosis not present

## 2018-04-10 ENCOUNTER — Other Ambulatory Visit: Payer: Self-pay | Admitting: Family Medicine

## 2018-04-10 DIAGNOSIS — F419 Anxiety disorder, unspecified: Secondary | ICD-10-CM

## 2018-04-10 NOTE — Telephone Encounter (Signed)
[facility] faxed refill request for the following medications:  ALPRAZolam (XANAX) 0.5 MG tablet  Qty: 180  Last fill date: 01/22/2018  Please advise.

## 2018-04-11 ENCOUNTER — Other Ambulatory Visit: Payer: Self-pay | Admitting: Family Medicine

## 2018-04-11 DIAGNOSIS — F419 Anxiety disorder, unspecified: Secondary | ICD-10-CM

## 2018-04-12 ENCOUNTER — Other Ambulatory Visit: Payer: Self-pay | Admitting: Physician Assistant

## 2018-04-12 ENCOUNTER — Other Ambulatory Visit: Payer: Self-pay | Admitting: Family Medicine

## 2018-04-12 DIAGNOSIS — F419 Anxiety disorder, unspecified: Secondary | ICD-10-CM

## 2018-04-12 MED ORDER — ALPRAZOLAM 0.5 MG PO TABS: ORAL_TABLET | ORAL | 0 refills | Status: DC

## 2018-04-12 NOTE — Telephone Encounter (Signed)
Sent refill to the [facility] and recommend patient schedule a recheck appointment to assess effectiveness of this medication and be sure headaches from concussion are improving.

## 2018-06-06 ENCOUNTER — Encounter: Payer: Self-pay | Admitting: Physician Assistant

## 2018-06-06 ENCOUNTER — Telehealth: Payer: Self-pay | Admitting: Family Medicine

## 2018-06-06 ENCOUNTER — Ambulatory Visit (INDEPENDENT_AMBULATORY_CARE_PROVIDER_SITE_OTHER): Payer: BLUE CROSS/BLUE SHIELD | Admitting: Physician Assistant

## 2018-06-06 VITALS — BP 122/77 | HR 84 | Temp 98.1°F | Resp 16 | Wt 152.0 lb

## 2018-06-06 DIAGNOSIS — B372 Candidiasis of skin and nail: Secondary | ICD-10-CM | POA: Diagnosis not present

## 2018-06-06 MED ORDER — KETOCONAZOLE 2 % EX CREA
1.0000 | TOPICAL_CREAM | Freq: Two times a day (BID) | CUTANEOUS | 0 refills | Status: DC | PRN
Start: 2018-06-06 — End: 2018-07-22

## 2018-06-06 NOTE — Progress Notes (Signed)
Patient: Gina [name] Female    DOB: 1955-12-06   62 y.o.   MRN: [id number] Visit Date: 06/06/2018  Today's Provider: Trinna Post, PA-C   Chief Complaint  Patient presents with  . Vaginal Itching   Subjective:     HPI Patient here today c/o vaginal redness and under her breast. Patient report she has been using lotions and that has not being helping. Patient denies any itching around vaginal area. Patient reports she also bathes in Dr. Yvette Rack. Patient frequently sweats at night.   Allergies  Allergen Reactions  . Omeprazole Rash    Previously tolerated Prevacid, so do not feel like this is "class wide"     Current Outpatient Medications:  .  acetaminophen (TYLENOL) 325 MG tablet, Take 2 tablets (650 mg total) by mouth every 6 (six) hours as needed., Disp: 30 tablet, Rfl: 0 .  ALPRAZolam (XANAX) 0.5 MG tablet, TAKE 1 TABLET BY MOUTH EVERY DAY AND 1 TABLET AT BEDTIME, Disp: 180 tablet, Rfl: 0 .  amLODipine (NORVASC) 5 MG tablet, Take 5 mg by mouth at bedtime, Disp: 90 tablet, Rfl: 3 .  azelastine (ASTELIN) 0.1 % nasal spray, Place 1 spray into both nostrils 2 (two) times daily. Use in each nostril as directed (Patient taking differently: Place 1 spray into both nostrils 2 (two) times daily. Instill 1 spray into each nostril two times a day, every other day), Disp: 30 mL, Rfl: 12 .  B COMPLEX VITAMINS PO, Take 1 tablet by mouth daily., Disp: , Rfl:  .  cetirizine (ZYRTEC) 10 MG tablet, Take 10 mg by mouth See admin instructions. Take 10 mg by mouth every other night, Disp: , Rfl:  .  Cholecalciferol (VITAMIN D3) 1000 UNITS CAPS, Take 1,000 Units by mouth at bedtime. , Disp: , Rfl:  .  clotrimazole-betamethasone (LOTRISONE) cream, Apply 1 application topically 2 (two) times daily., Disp: 45 g, Rfl: 1 .  diphenhydrAMINE (BENADRYL) 25 mg capsule, Take 25 mg by mouth at bedtime., Disp: , Rfl:  .  fluticasone (FLONASE) 50 MCG/ACT nasal spray, SPRAY 2 SPRAYS INTO EACH NOSTRIL  EVERY DAY (Patient taking differently: Instill 2 sprays into each nsotril every other day), Disp: 48 g, Rfl: 1 .  gabapentin (NEURONTIN) 100 MG capsule, Take 200 mg by mouth 2 (two) times daily., Disp: , Rfl: 3 .  ibuprofen (ADVIL,MOTRIN) 600 MG tablet, TAKE 1 TABLET BY MOUTH EVERY 6 HOURS AS NEEDED, Disp: 90 tablet, Rfl: 1 .  montelukast (SINGULAIR) 10 MG tablet, TAKE 1 TABLET BY MOUTH EVERY DAY (Patient taking differently: Take 10 mg by mouth every other night), Disp: 90 tablet, Rfl: 3 .  nortriptyline (PAMELOR) 10 MG capsule, TAKE 4 CAPSULES BY MOUTH AT NIGHT, Disp: , Rfl: 3 .  nystatin cream (MYCOSTATIN), APPLY TO AFFECTED AREA TWICE A DAY, Disp: 30 g, Rfl: 0 .  pantoprazole (PROTONIX) 40 MG tablet, TAKE 1 TABLET BY MOUTH EVERY DAY, Disp: 90 tablet, Rfl: 1  Review of Systems  Constitutional: Negative.   Skin: Positive for rash.    Social History   Tobacco Use  . Smoking status: Never Smoker  . Smokeless tobacco: Never Used  Substance Use Topics  . Alcohol use: No    Alcohol/week: 0.0 standard drinks      Objective:   BP 122/77 (BP Location: Left Arm, Patient Position: Sitting, Cuff Size: Normal)   Pulse 84   Temp 98.1 F (36.7 C) (Oral)   Resp 16  Wt 152 lb (68.9 kg)   BMI 26.93 kg/m  Vitals:   06/06/18 1152  BP: 122/77  Pulse: 84  Resp: 16  Temp: 98.1 F (36.7 C)  TempSrc: Oral  Weight: 152 lb (68.9 kg)     Physical Exam Constitutional:      Appearance: Normal appearance.  Skin:    General: Skin is warm and dry.     Findings: Erythema and rash present.       Neurological:     General: No focal deficit present.     Mental Status: She is alert and oriented to person, place, and time.  Psychiatric:        Mood and Affect: Mood normal.        Behavior: Behavior normal.         Assessment & Plan    1. Candidal intertrigo  Patient very centered about receiving oral medication regarding this. I'm not sure if this case is particularly resistant or if  behaviors have not been modified. Can treat with oral medication and then follow up with cream.   - ketoconazole (NIZORAL) 2 % cream; Apply 1 application topically 2 (two) times daily as needed for irritation.  Dispense: 15 g; Refill: 0 - fluconazole (DIFLUCAN) 150 MG tablet; Take 1 tablet (150 mg total) by mouth once for 1 dose.  Dispense: 1 tablet; Refill: 0  The entirety of the information documented in the History of Present Illness, Review of Systems and Physical Exam were personally obtained by me. Portions of this information were initially documented by Lendon Ka, CMA and reviewed by me for thoroughness and accuracy.   Return if symptoms worsen or fail to improve.        Trinna Post, PA-C  [facility]

## 2018-06-06 NOTE — Telephone Encounter (Signed)
Pt needing a call back regarding the Rx that was called in for her from today's visit.  Please call pt back.  Thanks, [employer]

## 2018-06-06 NOTE — Telephone Encounter (Signed)
Patient requesting oral medication. sd

## 2018-06-06 NOTE — Patient Instructions (Signed)
Intertrigo  Intertrigo is skin irritation (inflammation) that happens in warm, moist areas of the body. The irritation can cause a rash and make skin raw and itchy. The rash is usually pink or red. It happens mostly between folds of skin or where skin rubs together, such as:   Between the toes.   In the armpits.   In the groin area.   Under the belly.   Under the breasts.   Around the butt area.  This condition is not passed from person to person (is not contagious).  What are the causes?   Heat, moisture, rubbing, and not enough air movement.   The condition can be made worse by:  ? Sweat.  ? Bacteria.  ? A fungus, such as yeast.  What increases the risk?   Moisture in your skin folds.   You are more likely to develop this condition if you:  ? Have diabetes.  ? Are overweight.  ? Are not able to move around.  ? Live in a warm and moist climate.  ? Wear splints, braces, or other medical devices.  ? Are not able to control your pee (urine) or poop (stool).  What are the signs or symptoms?   A pink or red skin rash in the skin fold or near the skin fold.   Raw or scaly skin.   Itching.   A burning feeling.   Bleeding.   Leaking fluid.   A bad smell.  How is this treated?   Cleaning and drying your skin.   Taking an antibiotic medicine or using an antibiotic skin cream for a bacterial infection.   Using an antifungal cream on your skin or taking pills for an infection that was caused by a fungus, such as yeast.   Using a steroid ointment to stop the itching and irritation.   Separating the skin fold with a clean cotton cloth to absorb moisture and allow air to flow into the area.  Follow these instructions at home:   Keep the affected area clean and dry.   Do not scratch your skin.   Stay cool as much as you can. Use an air conditioner or a fan, if you have one.   Apply over-the-counter and prescription medicines only as told by your doctor.   If you were prescribed an antibiotic medicine,  use it as told by your doctor. Do not stop using the antibiotic even if your condition starts to get better.   Keep all follow-up visits as told by your doctor. This is important.  How is this prevented?     Stay at a healthy weight.   Take care of your feet. This is very important if you have diabetes. You should:  ? Wear shoes that fit well.  ? Keep your feet dry.  ? Wear clean cotton or wool socks.   Protect the skin in your groin and butt area as told by your doctor. To do this:  ? Follow a regular cleaning routine.  ? Use creams, powders, or ointments that protect your skin.  ? Change protection pads often.   Do not wear tight clothes. Wear clothes that:  ? Are loose.  ? Take moisture away from your body.  ? Are made of cotton.   Wear a bra that gives good support, if needed.   Shower and dry yourself well after being active. Use a hair dryer on a cool setting to dry between skin folds.   Keep your blood   sugar under control if you have diabetes.  Contact a doctor if:   Your symptoms do not get better with treatment.   Your symptoms get worse or they spread.   You notice more redness and warmth.   You have a fever.  Summary   Intertrigo is skin irritation that occurs when folds of skin rub together.   This condition is caused by heat, moisture, and rubbing.   This condition may be treated by cleaning and drying your skin and with medicines.   Apply over-the-counter and prescription medicines only as told by your doctor.   Keep all follow-up visits as told by your doctor. This is important.  This information is not intended to replace advice given to you by your health care provider. Make sure you discuss any questions you have with your health care provider.  Document Released: 05/27/2010 Document Revised: 09/24/2017 Document Reviewed: 10/26/2014  Elsevier Interactive Patient Education  2019 [employer]

## 2018-06-07 MED ORDER — FLUCONAZOLE 150 MG PO TABS: 150.0000 mg | ORAL_TABLET | Freq: Once | ORAL | 0 refills | Status: AC

## 2018-06-10 NOTE — Telephone Encounter (Signed)
Diflucan has been sent. After this, can use cream.

## 2018-06-14 ENCOUNTER — Other Ambulatory Visit: Payer: Self-pay | Admitting: Physician Assistant

## 2018-06-14 DIAGNOSIS — R1310 Dysphagia, unspecified: Secondary | ICD-10-CM

## 2018-07-09 DIAGNOSIS — F0781 Postconcussional syndrome: Secondary | ICD-10-CM | POA: Diagnosis not present

## 2018-07-22 ENCOUNTER — Other Ambulatory Visit: Payer: Self-pay

## 2018-07-22 ENCOUNTER — Other Ambulatory Visit (HOSPITAL_COMMUNITY)
Admission: RE | Admit: 2018-07-22 | Discharge: 2018-07-22 | Disposition: A | Discharge status: Home / Self Care | Payer: BLUE CROSS/BLUE SHIELD | Source: Ambulatory Visit | Attending: Physician Assistant | Admitting: Physician Assistant

## 2018-07-22 ENCOUNTER — Encounter: Payer: Self-pay | Admitting: Physician Assistant

## 2018-07-22 ENCOUNTER — Ambulatory Visit (INDEPENDENT_AMBULATORY_CARE_PROVIDER_SITE_OTHER): Payer: BLUE CROSS/BLUE SHIELD | Admitting: Physician Assistant

## 2018-07-22 VITALS — BP 144/89 | HR 86 | Temp 98.0°F | Resp 16 | Ht 63.0 in | Wt 153.2 lb

## 2018-07-22 DIAGNOSIS — R87610 Atypical squamous cells of undetermined significance on cytologic smear of cervix (ASC-US): Secondary | ICD-10-CM | POA: Diagnosis not present

## 2018-07-22 DIAGNOSIS — F419 Anxiety disorder, unspecified: Secondary | ICD-10-CM

## 2018-07-22 DIAGNOSIS — F325 Major depressive disorder, single episode, in full remission: Secondary | ICD-10-CM

## 2018-07-22 DIAGNOSIS — R7989 Other specified abnormal findings of blood chemistry: Secondary | ICD-10-CM

## 2018-07-22 DIAGNOSIS — Z124 Encounter for screening for malignant neoplasm of cervix: Secondary | ICD-10-CM | POA: Diagnosis not present

## 2018-07-22 DIAGNOSIS — Z1239 Encounter for other screening for malignant neoplasm of breast: Secondary | ICD-10-CM

## 2018-07-22 DIAGNOSIS — Z0000 Encounter for general adult medical examination without abnormal findings: Secondary | ICD-10-CM | POA: Diagnosis not present

## 2018-07-22 DIAGNOSIS — E559 Vitamin D deficiency, unspecified: Secondary | ICD-10-CM

## 2018-07-22 DIAGNOSIS — F320 Major depressive disorder, single episode, mild: Secondary | ICD-10-CM | POA: Insufficient documentation

## 2018-07-22 DIAGNOSIS — B372 Candidiasis of skin and nail: Secondary | ICD-10-CM

## 2018-07-22 DIAGNOSIS — Z114 Encounter for screening for human immunodeficiency virus [HIV]: Secondary | ICD-10-CM

## 2018-07-22 MED ORDER — KETOCONAZOLE 2 % EX CREA: 1.0000 "application " | TOPICAL_CREAM | Freq: Two times a day (BID) | CUTANEOUS | 1 refills | Status: DC | PRN

## 2018-07-22 MED ORDER — FLUCONAZOLE 150 MG PO TABS: 150.0000 mg | ORAL_TABLET | Freq: Every day | ORAL | 0 refills | Status: DC

## 2018-07-22 NOTE — Patient Instructions (Signed)
Health Maintenance for Postmenopausal Women Menopause is a normal process in which your reproductive ability comes to an end. This process happens gradually over a span of months to years, usually between the ages of 62 and 89. Menopause is complete when you have missed 12 consecutive menstrual periods. It is important to talk with your health care provider about some of the most common conditions that affect postmenopausal women, such as heart disease, cancer, and bone loss (osteoporosis). Adopting a healthy lifestyle and getting preventive care can help to promote your health and wellness. Those actions can also lower your chances of developing some of these common conditions. What should I know about menopause? During menopause, you may experience a number of symptoms, such as:  Moderate-to-severe hot flashes.  Night sweats.  Decrease in sex drive.  Mood swings.  Headaches.  Tiredness.  Irritability.  Memory problems.  Insomnia. Choosing to treat or not to treat menopausal changes is an individual decision that you make with your health care provider. What should I know about hormone replacement therapy and supplements? Hormone therapy products are effective for treating symptoms that are associated with menopause, such as hot flashes and night sweats. Hormone replacement carries certain risks, especially as you become older. If you are thinking about using estrogen or estrogen with progestin treatments, discuss the benefits and risks with your health care provider. What should I know about heart disease and stroke? Heart disease, heart attack, and stroke become more likely as you age. This may be due, in part, to the hormonal changes that your body experiences during menopause. These can affect how your body processes dietary fats, triglycerides, and cholesterol. Heart attack and stroke are both medical emergencies. There are many things that you can do to help prevent heart disease  and stroke:  Have your blood pressure checked at least every 1-2 years. High blood pressure causes heart disease and increases the risk of stroke.  If you are 79-72 years old, ask your health care provider if you should take aspirin to prevent a heart attack or a stroke.  Do not use any tobacco products, including cigarettes, chewing tobacco, or electronic cigarettes. If you need help quitting, ask your health care provider.  It is important to eat a healthy diet and maintain a healthy weight. ? Be sure to include plenty of vegetables, fruits, low-fat dairy products, and lean protein. ? Avoid eating foods that are high in solid fats, added sugars, or salt (sodium).  Get regular exercise. This is one of the most important things that you can do for your health. ? Try to exercise for at least 150 minutes each week. The type of exercise that you do should increase your heart rate and make you sweat. This is known as moderate-intensity exercise. ? Try to do strengthening exercises at least twice each week. Do these in addition to the moderate-intensity exercise.  Know your numbers.Ask your health care provider to check your cholesterol and your blood glucose. Continue to have your blood tested as directed by your health care provider.  What should I know about cancer screening? There are several types of cancer. Take the following steps to reduce your risk and to catch any cancer development as early as possible. Breast Cancer  Practice breast self-awareness. ? This means understanding how your breasts normally appear and feel. ? It also means doing regular breast self-exams. Let your health care provider know about any changes, no matter how small.  If you are 40 or  older, have a clinician do a breast exam (clinical breast exam or CBE) every year. Depending on your age, family history, and medical history, it may be recommended that you also have a yearly breast X-ray (mammogram).  If you  have a family history of breast cancer, talk with your health care provider about genetic screening.  If you are at high risk for breast cancer, talk with your health care provider about having an MRI and a mammogram every year.  Breast cancer (BRCA) gene test is recommended for women who have family members with BRCA-related cancers. Results of the assessment will determine the need for genetic counseling and BRCA1 and for BRCA2 testing. BRCA-related cancers include these types: ? Breast. This occurs in males or females. ? Ovarian. ? Tubal. This may also be called fallopian tube cancer. ? Cancer of the abdominal or pelvic lining (peritoneal cancer). ? Prostate. ? Pancreatic. Cervical, Uterine, and Ovarian Cancer Your health care provider may recommend that you be screened regularly for cancer of the pelvic organs. These include your ovaries, uterus, and vagina. This screening involves a pelvic exam, which includes checking for microscopic changes to the surface of your cervix (Pap test).  For women ages 21-65, health care providers may recommend a pelvic exam and a Pap test every three years. For women ages 39-65, they may recommend the Pap test and pelvic exam, combined with testing for human papilloma virus (HPV), every five years. Some types of HPV increase your risk of cervical cancer. Testing for HPV may also be done on women of any age who have unclear Pap test results.  Other health care providers may not recommend any screening for nonpregnant women who are considered low risk for pelvic cancer and have no symptoms. Ask your health care provider if a screening pelvic exam is right for you.  If you have had past treatment for cervical cancer or a condition that could lead to cancer, you need Pap tests and screening for cancer for at least 20 years after your treatment. If Pap tests have been discontinued for you, your risk factors (such as having a new sexual partner) need to be reassessed  to determine if you should start having screenings again. Some women have medical problems that increase the chance of getting cervical cancer. In these cases, your health care provider may recommend that you have screening and Pap tests more often.  If you have a family history of uterine cancer or ovarian cancer, talk with your health care provider about genetic screening.  If you have vaginal bleeding after reaching menopause, tell your health care provider.  There are currently no reliable tests available to screen for ovarian cancer. Lung Cancer Lung cancer screening is recommended for adults 57-50 years old who are at high risk for lung cancer because of a history of smoking. A yearly low-dose CT scan of the lungs is recommended if you:  Currently smoke.  Have a history of at least 30 pack-years of smoking and you currently smoke or have quit within the past 15 years. A pack-year is smoking an average of one pack of cigarettes per day for one year. Yearly screening should:  Continue until it has been 15 years since you quit.  Stop if you develop a health problem that would prevent you from having lung cancer treatment. Colorectal Cancer  This type of cancer can be detected and can often be prevented.  Routine colorectal cancer screening usually begins at age 12 and continues through  age 63.  If you have risk factors for colon cancer, your health care provider may recommend that you be screened at an earlier age.  If you have a family history of colorectal cancer, talk with your health care provider about genetic screening.  Your health care provider may also recommend using home test kits to check for hidden blood in your stool.  A small camera at the end of a tube can be used to examine your colon directly (sigmoidoscopy or colonoscopy). This is done to check for the earliest forms of colorectal cancer.  Direct examination of the colon should be repeated every 5-10 years until  age 75. However, if early forms of precancerous polyps or small growths are found or if you have a family history or genetic risk for colorectal cancer, you may need to be screened more often. Skin Cancer  Check your skin from head to toe regularly.  Monitor any moles. Be sure to tell your health care provider: ? About any new moles or changes in moles, especially if there is a change in a mole's shape or color. ? If you have a mole that is larger than the size of a pencil eraser.  If any of your family members has a history of skin cancer, especially at a young age, talk with your health care provider about genetic screening.  Always use sunscreen. Apply sunscreen liberally and repeatedly throughout the day.  Whenever you are outside, protect yourself by wearing long sleeves, pants, a wide-brimmed hat, and sunglasses. What should I know about osteoporosis? Osteoporosis is a condition in which bone destruction happens more quickly than new bone creation. After menopause, you may be at an increased risk for osteoporosis. To help prevent osteoporosis or the bone fractures that can happen because of osteoporosis, the following is recommended:  If you are 59-59 years old, get at least 1,000 mg of calcium and at least 600 mg of vitamin D per day.  If you are older than age 36 but younger than age 32, get at least 1,200 mg of calcium and at least 600 mg of vitamin D per day.  If you are older than age 47, get at least 1,200 mg of calcium and at least 800 mg of vitamin D per day. Smoking and excessive alcohol intake increase the risk of osteoporosis. Eat foods that are rich in calcium and vitamin D, and do weight-bearing exercises several times each week as directed by your health care provider. What should I know about how menopause affects my mental health? Depression may occur at any age, but it is more common as you become older. Common symptoms of depression include:  Low or sad mood.   Changes in sleep patterns.  Changes in appetite or eating patterns.  Feeling an overall lack of motivation or enjoyment of activities that you previously enjoyed.  Frequent crying spells. Talk with your health care provider if you think that you are experiencing depression. What should I know about immunizations? It is important that you get and maintain your immunizations. These include:  Tetanus, diphtheria, and pertussis (Tdap) booster vaccine.  Influenza every year before the flu season begins.  Pneumonia vaccine.  Shingles vaccine. Your health care provider may also recommend other immunizations. This information is not intended to replace advice given to you by your health care provider. Make sure you discuss any questions you have with your health care provider. Document Released: 06/16/2005 Document Revised: 11/12/2015 Document Reviewed: 01/26/2015 Elsevier Interactive Patient Education  2019 [place].
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
## 2018-07-24 DIAGNOSIS — E559 Vitamin D deficiency, unspecified: Secondary | ICD-10-CM | POA: Diagnosis not present

## 2018-07-24 DIAGNOSIS — Z0000 Encounter for general adult medical examination without abnormal findings: Secondary | ICD-10-CM | POA: Diagnosis not present

## 2018-07-24 DIAGNOSIS — R7989 Other specified abnormal findings of blood chemistry: Secondary | ICD-10-CM | POA: Diagnosis not present

## 2018-07-24 DIAGNOSIS — Z114 Encounter for screening for human immunodeficiency virus [HIV]: Secondary | ICD-10-CM | POA: Diagnosis not present

## 2018-07-24 LAB — CYTOLOGY - PAP
Diagnosis: UNDETERMINED — AB
HPV: NOT DETECTED

## 2018-07-25 LAB — COMPREHENSIVE METABOLIC PANEL
ALT: 20 IU/L (ref 0–32)
AST: 24 IU/L (ref 0–40)
Albumin/Globulin Ratio: 2.1 (ref 1.2–2.2)
Albumin: 4.2 g/dL (ref 3.8–4.9)
Alkaline Phosphatase: 95 IU/L (ref 39–117)
BUN/Creatinine Ratio: 12 (ref 12–28)
BUN: 10 mg/dL (ref 8–27)
Bilirubin Total: 0.3 mg/dL (ref 0.0–1.2)
CALCIUM: 9.2 mg/dL (ref 8.7–10.3)
CHLORIDE: 102 mmol/L (ref 96–106)
CO2: 24 mmol/L (ref 20–29)
Creatinine, Ser: 0.82 mg/dL (ref 0.57–1.00)
GFR, EST AFRICAN AMERICAN: 90 mL/min/{1.73_m2} (ref >59)
GFR, EST NON AFRICAN AMERICAN: 78 mL/min/{1.73_m2} (ref >59)
GLUCOSE: 87 mg/dL (ref 65–99)
Globulin, Total: 2 g/dL (ref 1.5–4.5)
Potassium: 4 mmol/L (ref 3.5–5.2)
Sodium: 141 mmol/L (ref 134–144)
TOTAL PROTEIN: 6.2 g/dL (ref 6.0–8.5)

## 2018-07-25 LAB — CBC WITH DIFFERENTIAL/PLATELET
BASOS ABS: 0.1 10*3/uL (ref 0.0–0.2)
BASOS: 1 %
EOS (ABSOLUTE): 0.2 10*3/uL (ref 0.0–0.4)
Eos: 5 %
Hematocrit: 38.4 % (ref 34.0–46.6)
Hemoglobin: 13 g/dL (ref 11.1–15.9)
IMMATURE GRANS (ABS): 0 10*3/uL (ref 0.0–0.1)
IMMATURE GRANULOCYTES: 0 %
LYMPHS: 24 %
Lymphocytes Absolute: 1 10*3/uL (ref 0.7–3.1)
MCH: 31 pg (ref 26.6–33.0)
MCHC: 33.9 g/dL (ref 31.5–35.7)
MCV: 92 fL (ref 79–97)
Monocytes Absolute: 0.3 10*3/uL (ref 0.1–0.9)
Monocytes: 8 %
NEUTROS PCT: 62 %
Neutrophils Absolute: 2.6 10*3/uL (ref 1.4–7.0)
PLATELETS: 272 10*3/uL (ref 150–450)
RBC: 4.19 x10E6/uL (ref 3.77–5.28)
RDW: 13.1 % (ref 11.7–15.4)
WBC: 4.3 10*3/uL (ref 3.4–10.8)

## 2018-07-25 LAB — LIPID PANEL WITH LDL/HDL RATIO
Cholesterol, Total: 205 mg/dL — ABNORMAL HIGH (ref 100–199)
HDL: 65 mg/dL (ref >39)
LDL Calculated: 122 mg/dL — ABNORMAL HIGH (ref 0–99)
LDL/HDL RATIO: 1.9 ratio (ref 0.0–3.2)
Triglycerides: 90 mg/dL (ref 0–149)
VLDL CHOLESTEROL CAL: 18 mg/dL (ref 5–40)

## 2018-07-25 LAB — HIV ANTIBODY (ROUTINE TESTING W REFLEX): HIV Screen 4th Generation wRfx: NONREACTIVE

## 2018-07-25 LAB — TSH: TSH: 6.58 u[IU]/mL — AB (ref 0.450–4.500)

## 2018-07-25 LAB — VITAMIN D 25 HYDROXY (VIT D DEFICIENCY, FRACTURES): Vit D, 25-Hydroxy: 27.3 ng/mL — ABNORMAL LOW (ref 30.0–100.0)

## 2018-07-26 ENCOUNTER — Other Ambulatory Visit: Payer: Self-pay

## 2018-07-26 DIAGNOSIS — F419 Anxiety disorder, unspecified: Secondary | ICD-10-CM

## 2018-07-26 DIAGNOSIS — E034 Atrophy of thyroid (acquired): Secondary | ICD-10-CM

## 2018-07-26 NOTE — Telephone Encounter (Signed)
LMTCB 07/26/2018  Thanks,   -Laura  

## 2018-07-26 NOTE — Telephone Encounter (Signed)
-----   Message from Mar Daring, [place] sent at 07/26/2018  7:47 AM EDT ----- Blood count is normal. Kidney and liver function normal. Sodium, potassium, calcium normal. Sugar normal. Cholesterol slightly elevated and increased from last year. Make sure to work on healthy lifestyle modifications with healthy dieting habits and exercise. Thyroid is slightly elevated. This indicates the thyroid gland is not working as well. May be beneficial to start a low dose thyroid supplementation. Vit D is also low. Would recommend to start an OTC Vit D supplement of 1000-2000 IU daily. Pap again shows ASCUS (most common abnormal finding) but is HPV negative. With these findings we normally screen on a 3 yr basis so we would repeat pap in 2023.

## 2018-07-29 MED ORDER — LEVOTHYROXINE SODIUM 25 MCG PO TABS: 25.0000 ug | ORAL_TABLET | Freq: Every day | ORAL | 0 refills | Status: DC

## 2018-07-29 MED ORDER — ALPRAZOLAM 0.5 MG PO TABS
ORAL_TABLET | ORAL | 0 refills | Status: DC
Start: 2018-07-29 — End: 2018-11-05

## 2018-07-29 NOTE — Telephone Encounter (Signed)
Patient advised as below. Patient reports she will start thyroid medication, [facility] send to [facility]

## 2018-07-29 NOTE — Telephone Encounter (Signed)
Refilled alprazolam and started levothyroxine 71mcg

## 2018-08-01 ENCOUNTER — Telehealth: Payer: Self-pay

## 2018-08-01 DIAGNOSIS — B372 Candidiasis of skin and nail: Secondary | ICD-10-CM

## 2018-08-01 NOTE — Telephone Encounter (Signed)
Patient states that she is still red under her breast and the pharmacy only gave her 1 tablet. I stated to patient that you had dispense 3 tablets.

## 2018-08-02 MED ORDER — FLUCONAZOLE 150 MG PO TABS: 150.0000 mg | ORAL_TABLET | Freq: Every day | ORAL | 3 refills | Status: DC

## 2018-08-02 NOTE — Telephone Encounter (Signed)
Patient advised as below.  

## 2018-08-02 NOTE — Telephone Encounter (Signed)
Its probably from her insurance. I will send in with refills this time.

## 2018-09-15 ENCOUNTER — Other Ambulatory Visit: Payer: Self-pay | Admitting: Physician Assistant

## 2018-09-15 DIAGNOSIS — J301 Allergic rhinitis due to pollen: Secondary | ICD-10-CM

## 2018-09-27 ENCOUNTER — Other Ambulatory Visit: Payer: Self-pay | Admitting: Physician Assistant

## 2018-10-15 ENCOUNTER — Other Ambulatory Visit: Payer: Self-pay | Admitting: Physician Assistant

## 2018-10-15 DIAGNOSIS — E034 Atrophy of thyroid (acquired): Secondary | ICD-10-CM

## 2018-10-16 ENCOUNTER — Other Ambulatory Visit: Payer: Self-pay | Admitting: Physician Assistant

## 2018-10-16 DIAGNOSIS — Z1231 Encounter for screening mammogram for malignant neoplasm of breast: Secondary | ICD-10-CM

## 2018-10-22 ENCOUNTER — Telehealth: Payer: Self-pay | Admitting: Physician Assistant

## 2018-10-22 DIAGNOSIS — B372 Candidiasis of skin and nail: Secondary | ICD-10-CM

## 2018-10-22 MED ORDER — FLUCONAZOLE 150 MG PO TABS: 150.0000 mg | ORAL_TABLET | Freq: Once | ORAL | 3 refills | Status: AC

## 2018-10-22 NOTE — Telephone Encounter (Signed)
Pt called saying she has the little rash "dots" under her breast again.  She ask for the diflucan pill  [facility] [place]  CB#  [phone]  Thanks teri

## 2018-10-22 NOTE — Telephone Encounter (Signed)
Sent in

## 2018-10-24 ENCOUNTER — Other Ambulatory Visit: Payer: Self-pay

## 2018-10-24 ENCOUNTER — Ambulatory Visit
Admission: RE | Admit: 2018-10-24 | Discharge: 2018-10-24 | Disposition: A | Discharge status: Home / Self Care | Payer: BLUE CROSS/BLUE SHIELD | Source: Ambulatory Visit | Attending: Physician Assistant | Admitting: Physician Assistant

## 2018-10-24 DIAGNOSIS — Z1231 Encounter for screening mammogram for malignant neoplasm of breast: Secondary | ICD-10-CM | POA: Insufficient documentation

## 2018-10-25 ENCOUNTER — Telehealth: Payer: Self-pay

## 2018-10-25 ENCOUNTER — Other Ambulatory Visit: Payer: Self-pay | Admitting: Physician Assistant

## 2018-10-25 DIAGNOSIS — R51 Headache: Secondary | ICD-10-CM | POA: Diagnosis not present

## 2018-10-25 DIAGNOSIS — B372 Candidiasis of skin and nail: Secondary | ICD-10-CM

## 2018-10-25 NOTE — Telephone Encounter (Signed)
-----   Message from Mar Daring, [place] sent at 10/25/2018 12:25 PM EDT ----- Normal mammogram. Repeat screening in one year.

## 2018-10-25 NOTE — Telephone Encounter (Signed)
Patient was advised.  

## 2018-11-05 ENCOUNTER — Other Ambulatory Visit: Payer: Self-pay | Admitting: Physician Assistant

## 2018-11-05 DIAGNOSIS — F419 Anxiety disorder, unspecified: Secondary | ICD-10-CM

## 2018-11-07 ENCOUNTER — Encounter: Payer: Self-pay | Admitting: Physician Assistant

## 2018-11-07 ENCOUNTER — Other Ambulatory Visit: Payer: Self-pay

## 2018-11-07 ENCOUNTER — Ambulatory Visit (INDEPENDENT_AMBULATORY_CARE_PROVIDER_SITE_OTHER): Payer: BLUE CROSS/BLUE SHIELD | Admitting: Physician Assistant

## 2018-11-07 VITALS — BP 146/90 | HR 75 | Temp 98.0°F | Resp 16 | Wt 152.8 lb

## 2018-11-07 DIAGNOSIS — L409 Psoriasis, unspecified: Secondary | ICD-10-CM | POA: Diagnosis not present

## 2018-11-07 DIAGNOSIS — B372 Candidiasis of skin and nail: Secondary | ICD-10-CM

## 2018-11-07 MED ORDER — FLUCONAZOLE 150 MG PO TABS: ORAL_TABLET | ORAL | 0 refills | Status: DC

## 2018-11-07 MED ORDER — PREDNISONE 5 MG (21) PO TBPK: ORAL_TABLET | ORAL | 0 refills | Status: DC

## 2018-11-07 NOTE — Progress Notes (Signed)
Patient: Gina [name] Female    DOB: February 16, 1956   63 y.o.   MRN: [id number] Visit Date: 11/07/2018  Today's Provider: Trinna Post, PA-C   Chief Complaint  Patient presents with  . Rash   Subjective:     HPI   Patient here today c/o rash on legs and chest area x's several weeks. Patient reports rash is still present since 10/25/2018. Patient reports using prescription creams and reports mild symptom control. Patient reports rash is worse with heat.   Patient has a longstanding history of multiple rashes. She has known psoriasis for which she sees a dermatologist but has not been to see them recently. She also has recurrent candidal intertrigo for which she has received numerous treatments including lotrisone, fluconazole PO, ketoconazole cream, nystatin cream, and mycolog ointment. She has a history of reporting that no creams work for this her intertrigo and requesting fluconazole PO, which she will then report does not work as well.   Allergies  Allergen Reactions  . Omeprazole Rash    Previously tolerated Prevacid, so do not feel like this is "class wide"     Current Outpatient Medications:  .  acetaminophen (TYLENOL) 325 MG tablet, Take 2 tablets (650 mg total) by mouth every 6 (six) hours as needed., Disp: 30 tablet, Rfl: 0 .  ALPRAZolam (XANAX) 0.5 MG tablet, TAKE 1 TABLET BY MOUTH EVERY DAY AND 1 TABLET AT BEDTIME, Disp: 180 tablet, Rfl: 1 .  amLODipine (NORVASC) 5 MG tablet, Take 5 mg by mouth at bedtime, Disp: 90 tablet, Rfl: 3 .  azelastine (ASTELIN) 0.1 % nasal spray, PLACE 1 SPRAY INTO BOTH NOSTRILS 2 (TWO) TIMES DAILY. USE IN EACH NOSTRIL AS DIRECTED, Disp: 30 mL, Rfl: 4 .  B COMPLEX VITAMINS PO, Take 1 tablet by mouth daily., Disp: , Rfl:  .  cetirizine (ZYRTEC) 10 MG tablet, Take 10 mg by mouth See admin instructions. Take 10 mg by mouth every other night, Disp: , Rfl:  .  Cholecalciferol (VITAMIN D3) 1000 UNITS CAPS, Take 1,000 Units by mouth at  bedtime. , Disp: , Rfl:  .  clotrimazole-betamethasone (LOTRISONE) cream, Apply 1 application topically 2 (two) times daily., Disp: 45 g, Rfl: 1 .  diphenhydrAMINE (BENADRYL) 25 mg capsule, Take 25 mg by mouth at bedtime., Disp: , Rfl:  .  fluticasone (FLONASE) 50 MCG/ACT nasal spray, SPRAY 2 SPRAYS INTO EACH NOSTRIL EVERY DAY (Patient taking differently: Instill 2 sprays into each nsotril every other day), Disp: 48 g, Rfl: 1 .  gabapentin (NEURONTIN) 100 MG capsule, Take 200 mg by mouth 2 (two) times daily., Disp: , Rfl: 3 .  ibuprofen (ADVIL) 600 MG tablet, TAKE 1 TABLET BY MOUTH EVERY 6 HOURS AS NEEDED, Disp: 90 tablet, Rfl: 1 .  ketoconazole (NIZORAL) 2 % cream, APPLY 1 APPLICATION TOPICALLY 2 (TWO) TIMES DAILY AS NEEDED FOR IRRITATION., Disp: 30 g, Rfl: 1 .  levothyroxine (SYNTHROID) 25 MCG tablet, TAKE 1 TABLET (25 MCG TOTAL) BY MOUTH DAILY BEFORE BREAKFAST., Disp: 90 tablet, Rfl: 1 .  montelukast (SINGULAIR) 10 MG tablet, TAKE 1 TABLET BY MOUTH EVERY DAY (Patient taking differently: Take 10 mg by mouth every other night), Disp: 90 tablet, Rfl: 3 .  nortriptyline (PAMELOR) 10 MG capsule, TAKE 4 CAPSULES BY MOUTH AT NIGHT, Disp: , Rfl: 3 .  nystatin cream (MYCOSTATIN), APPLY TO AFFECTED AREA TWICE A DAY, Disp: 30 g, Rfl: 0 .  pantoprazole (PROTONIX) 40 MG tablet, TAKE 1 TABLET  BY MOUTH EVERY DAY, Disp: 90 tablet, Rfl: 1  Review of Systems  Constitutional: Negative.   Respiratory: Negative.   Cardiovascular: Negative.   Skin: Positive for rash.    Social History   Tobacco Use  . Smoking status: Never Smoker  . Smokeless tobacco: Never Used  Substance Use Topics  . Alcohol use: No    Alcohol/week: 0.0 standard drinks      Objective:   BP (!) 146/90 (BP Location: Left Arm, Patient Position: Sitting, Cuff Size: Normal)   Pulse 75   Temp 98 F (36.7 C) (Oral)   Resp 16   Wt 152 lb 12.8 oz (69.3 kg)   SpO2 98%   BMI 27.07 kg/m  Vitals:   11/07/18 1600  BP: (!) 146/90   Pulse: 75  Resp: 16  Temp: 98 F (36.7 C)  TempSrc: Oral  SpO2: 98%  Weight: 152 lb 12.8 oz (69.3 kg)     Physical Exam Constitutional:      Appearance: Normal appearance.  Skin:      Neurological:     Mental Status: She is alert.  Psychiatric:        Mood and Affect: Mood normal.        Behavior: Behavior normal.      No results found for any visits on 11/07/18.     Assessment & Plan    1. Psoriasis  Acute on chronic. I have told her she would be better served by a dermatologist at this point due to the chronicity and refractory nature of herrashes. Treat as below.   - predniSONE (STERAPRED UNI-PAK 21 TAB) 5 MG (21) TBPK tablet; Take 6 pills on day 1, take 5 pills on day 2 and so on until complete.  Dispense: 21 tablet; Refill: 0  2. Candidal intertrigo  - fluconazole (DIFLUCAN) 150 MG tablet; Take 1 pill on day 1. Take second pill three days later if still having symptoms.  Dispense: 2 tablet; Refill: 0  The entirety of the information documented in the History of Present Illness, Review of Systems and Physical Exam were personally obtained by me. Portions of this information were initially documented by Lynford Humphrey, CMA and reviewed by me for thoroughness and accuracy.   F/u PRN     Trinna Post, PA-C  [facility]

## 2018-11-25 ENCOUNTER — Other Ambulatory Visit: Payer: Self-pay | Admitting: Physician Assistant

## 2018-11-27 NOTE — Patient Instructions (Signed)
Psoriasis Psoriasis is a long-term (chronic) skin condition. It occurs because your body's defense system (immune system) causes skin cells to form too quickly. This causes raised, red patches (plaques) on your skin that look silvery. The patches may be on all areas of your body. They can be any size or shape. Psoriasis can come and go. It can range from mild to very bad. It cannot be passed from one person to another (is not contagious). There is no cure for this condition, but it can be helped with treatment. What are the causes? The cause of psoriasis is not known. Some things can make it worse. These are:  Skin damage, such as cuts, scrapes, sunburn, and dryness.  Not getting enough sunlight.  Some medicines.  Alcohol.  Tobacco.  Stress.  Infections. What increases the risk?  Having a family member with psoriasis.  Being very overweight (obese).  Being 20-40 years old.  Taking certain medicines. What are the signs or symptoms? There are different types of psoriasis. The types are:  Plaque. This is the most common. Symptoms include red, raised patches with a silvery coating. These may be itchy. Your nails may be crumbly or fall off.  Guttate. Symptoms include small red spots on your stomach area, arms, and legs. These may happen after you have been sick, such as with strep throat.  Inverse. Symptoms include patches in your armpits, under your breasts, private areas, or on your butt.  Pustular. Symptoms include pus-filled bumps on the palms of your hands or the soles of your feet. You also may feel very tired, weak, have a fever, and not be hungry.  Erythrodermic. Symptoms include bright red skin that looks burned. You may have a fast heartbeat and a body temperature that is too high or too low. You may be itchy or in pain.  Sebopsoriasis. Symptoms include red patches on your scalp, forehead, and face that are greasy.  Psoriatic arthritis. Symptoms include swollen,  painful joints along with scaly skin patches. How is this treated? There is no cure for this condition, but treatment can:  Help your skin heal.  Lessen itching and irritation and swelling (inflammation).  Slow the growth of new skin cells.  Help your body's defense system respond better to your skin. Treatment may include:  Creams or ointments.  Light therapy. This may include natural sunlight or light therapy in a doctor's office.  Medicines. These can help your body better manage skin cells. They may be used with light therapy or ointments. Medicines may include pills or injections. You may also get antibiotic medicines if you have an infection. Follow these instructions at home: Skin Care  Apply lotion to your skin as needed. Only use those that your doctor has said are okay.  Apply cool, wet cloths (cold compresses) to the affected areas.  Do not use a hot tub or take hot showers. Use slightly warm, not hot, water when taking showers and baths.  Do not scratch your skin. Lifestyle   Do not use any products that contain nicotine or tobacco, such as cigarettes, e-cigarettes, and chewing tobacco. If you need help quitting, ask your doctor.  Lower your stress.  Keep a healthy weight.  Go out in the sun as told by your doctor. Do not get sunburned.  Join a support group. Medicines  Take or use over-the-counter and prescription medicines only as told by your doctor.  If you were prescribed an antibiotic medicine, take it as told by your doctor.   Do not stop using the antibiotic even if you start to feel better. Alcohol use If you drink alcohol:  Limit how much you use: ? 0-1 drink a day for women. ? 0-2 drinks a day for men.  Be aware of how much alcohol is in your drink. In the [place] one drink equals one 12 oz bottle of beer (355 mL), one 5 oz glass of wine (148 mL), or one 1 oz glass of hard liquor (44 mL). General instructions  Keep a journal to track the  things that cause symptoms (triggers). Try to avoid these things.  See a counselor if you feel the support would help.  Keep all follow-up visits as told by your doctor. This is important. Contact a doctor if:  You have a fever.  Your pain gets worse.  You have more redness or warmth in the affected areas.  You have new or worse pain or stiffness in your joints.  Your nails start to break easily or pull away from the nail bed.  You feel very sad (depressed). Summary  Psoriasis is a long-term (chronic) skin condition.  There is no cure for this condition, but treatment can help manage it.  Keep a journal to track the things that cause symptoms.  Take or use over-the-counter and prescription medicines only as told by your doctor.  Keep all follow-up visits as told by your doctor. This is important. This information is not intended to replace advice given to you by your health care provider. Make sure you discuss any questions you have with your health care provider. Document Released: 06/01/2004 Document Revised: 02/26/2018 Document Reviewed: 02/26/2018 Elsevier Patient Education  2020 Elsevier Inc.  

## 2018-12-09 ENCOUNTER — Other Ambulatory Visit: Payer: Self-pay | Admitting: Physician Assistant

## 2018-12-09 DIAGNOSIS — R1310 Dysphagia, unspecified: Secondary | ICD-10-CM

## 2018-12-11 ENCOUNTER — Other Ambulatory Visit: Payer: Self-pay | Admitting: Physician Assistant

## 2018-12-11 DIAGNOSIS — J301 Allergic rhinitis due to pollen: Secondary | ICD-10-CM

## 2018-12-22 ENCOUNTER — Other Ambulatory Visit: Payer: Self-pay | Admitting: Physician Assistant

## 2018-12-22 DIAGNOSIS — J301 Allergic rhinitis due to pollen: Secondary | ICD-10-CM

## 2018-12-23 ENCOUNTER — Other Ambulatory Visit: Payer: Self-pay | Admitting: Physician Assistant

## 2018-12-23 DIAGNOSIS — J301 Allergic rhinitis due to pollen: Secondary | ICD-10-CM

## 2018-12-27 ENCOUNTER — Telehealth: Payer: Self-pay | Admitting: Physician Assistant

## 2018-12-27 DIAGNOSIS — B379 Candidiasis, unspecified: Secondary | ICD-10-CM

## 2018-12-27 MED ORDER — FLUCONAZOLE 150 MG PO TABS: ORAL_TABLET | ORAL | 0 refills | Status: DC

## 2018-12-27 NOTE — Telephone Encounter (Signed)
Please advise 

## 2018-12-27 NOTE — Telephone Encounter (Signed)
LMOVM notifying pt rx was sent to pharmacy.

## 2018-12-27 NOTE — Telephone Encounter (Signed)
Sent to [facility]

## 2018-12-27 NOTE — Telephone Encounter (Signed)
Pt called asking if she could get a rx for Diflucan.  Her dentis prescribed an antibiotic and she has gotten a yeast infection  [facility]  Thanks , teri

## 2019-01-16 ENCOUNTER — Telehealth: Payer: Self-pay | Admitting: Physician Assistant

## 2019-01-16 NOTE — Telephone Encounter (Signed)
Please Review

## 2019-01-16 NOTE — Telephone Encounter (Signed)
°  Breast rash on side to leg - off and on. Wanting to know if she could be seen before the weekend.  Thanks, [employer]

## 2019-01-17 ENCOUNTER — Telehealth: Payer: Self-pay

## 2019-01-17 ENCOUNTER — Ambulatory Visit (INDEPENDENT_AMBULATORY_CARE_PROVIDER_SITE_OTHER): Payer: BLUE CROSS/BLUE SHIELD | Admitting: Physician Assistant

## 2019-01-17 ENCOUNTER — Encounter: Payer: Self-pay | Admitting: Physician Assistant

## 2019-01-17 ENCOUNTER — Other Ambulatory Visit: Payer: Self-pay

## 2019-01-17 VITALS — BP 130/82 | HR 63 | Temp 97.1°F | Resp 16 | Ht 63.0 in | Wt 153.0 lb

## 2019-01-17 DIAGNOSIS — Z23 Encounter for immunization: Secondary | ICD-10-CM

## 2019-01-17 DIAGNOSIS — L409 Psoriasis, unspecified: Secondary | ICD-10-CM

## 2019-01-17 MED ORDER — PREDNISONE 10 MG PO TABS: 10.0000 mg | ORAL_TABLET | Freq: Two times a day (BID) | ORAL | 0 refills | Status: DC

## 2019-01-17 MED ORDER — METHYLPREDNISOLONE ACETATE 40 MG/ML IJ SUSP: 40.0000 mg | Freq: Once | INTRAMUSCULAR | Status: DC

## 2019-01-17 NOTE — Telephone Encounter (Signed)
Patient called yesterday about a rash on her breast, never received a call back. Please advise.

## 2019-01-17 NOTE — Patient Instructions (Signed)
Psoriasis Psoriasis is a long-term (chronic) skin condition. It occurs because your immune system causes skin cells to form too quickly. As a result, too many skin cells grow and create raised, red patches (plaques) that often look silvery on your skin. Plaques may show up anywhere on your body. They can be any size or shape. Symptoms of this condition range from mild to very severe. Psoriasis cannot be passed from one person to another (is not contagious). Sometimes, the symptoms go away and then come back again. What are the causes? The cause of psoriasis is not known, but certain factors can make the condition worse. These include:  Damage or trauma to the skin, such as cuts, scrapes, sunburn, and dryness.  Not enough exposure to sunlight.  Certain medicines.  Alcohol.  Tobacco use.  Stress.  Infections caused by bacteria or viruses. What increases the risk? You are more likely to develop this condition if you:  Have a family history of psoriasis.  Are obese.  Are 66-60 years old.  Are taking certain medicines. What are the signs or symptoms? There are different types of psoriasis. You can have more than one type of psoriasis during your life. The types are:  Plaque. This is the most common.  Guttate. This is also called eruptive psoriasis.  Inverse.  Pustular.  Erythrodermic.  Sebopsoriasis.  Psoriatic arthritis. Each type of psoriasis has different symptoms.  Plaque psoriasis symptoms include red, raised plaques with a silvery-white coating (scale). These plaques may be itchy. Your nails may be pitted and crumbly or fall off.  Guttate psoriasis symptoms include small red spots that often show up on your trunk, arms, and legs. These spots may develop after you have been sick, especially with strep throat.  Inverse psoriasis symptoms include plaques in your underarm area, under your breasts, or on your genitals, groin, or buttocks.  Pustular psoriasis symptoms  include pus-filled bumps that are painful, red, and swollen on the palms of your hands or the soles of your feet. You also may feel exhausted, feverish, weak, or have no appetite.  Erythrodermic psoriasis symptoms include bright red skin that may look burned. You may have a fast heartbeat and a body temperature that is too high or too low. You may be itchy or in pain.  Sebopsoriasis symptoms include red plaques that have a greasy coating, and are often on your scalp, forehead, and face.  Psoriatic arthritis causes swollen, painful joints along with scaly skin plaques. How is this diagnosed? This condition is diagnosed based on your symptoms, family history, and a physical exam.  You may also be referred to a health care provider who specializes in skin diseases (dermatologist).  Your health care provider may remove a tissue sample (biopsy) for testing. How is this treated? There is no cure for this condition, but treatment can help manage it. Goals of treatment include:  Helping your skin heal.  Reducing itching and inflammation.  Slowing the growth of new skin cells.  Helping your immune system respond better to your skin. Treatment varies, depending on the severity of your condition. This condition may be treated by:  Creams or ointments to help with symptoms.  Ultraviolet ray exposure (light therapy or phototherapy). This may include natural sunlight or light therapy in a medical office.  Medicines (systemic therapy). These medicines can help your body better manage skin cell turnover and inflammation. Medicines may be given in the form of pills or injections. They may be used along with light  therapy or ointments. You may also get antibiotic medicines if you have an infection. Follow these instructions at home: [place] your skin as needed. Only use moisturizers that have been approved by your health care provider.  Apply cool, wet cloths (cold compresses) to the  affected areas.  Do not use a hot tub or take hot showers. Take lukewarm showers and baths.  Do not scratch your skin. Lifestyle   Do not use any products that contain nicotine or tobacco, such as cigarettes, e-cigarettes, and chewing tobacco. If you need help quitting, ask your health care provider.  Use techniques for stress reduction, such as meditation or yoga.  Maintain a healthy weight. Follow instructions from your health care provider for weight control. These may include dietary restrictions.  Get safe exposure to the sun as told by your health care provider. This may include spending regular intervals of time outdoors in sunlight. Do not get sunburned.  Consider joining a psoriasis support group. Medicines  Take or use over-the-counter and prescription medicines only as told by your health care provider.  If you were prescribed an antibiotic medicine, take it as told by your health care provider. Do not stop using the antibiotic even if you start to feel better. Alcohol use  Limit how much you use: ? 0-1 drink a day for women. ? 0-2 drinks a day for men.  Be aware of how much alcohol is in your drink. In the [place] one drink equals one 12 oz bottle of beer (355 mL), one 5 oz glass of wine (148 mL), or one 1 oz glass of hard liquor (44 mL). General instructions  Keep a journal to help track what triggers an outbreak. Try to avoid any triggers.  See a counselor if feelings of sadness, frustration, and hopelessness about your condition are interfering with your work and relationships.  Keep all follow-up visits as told by your health care provider. This is important. Contact a health care provider if:  You have a fever.  Your pain gets worse.  You have increasing redness or warmth in the affected areas.  You have new or worsening pain or stiffness in your joints.  Your nails start to break easily or pull away from the nail bed.  You feel depressed. Summary   Psoriasis is a long-term (chronic) skin condition. Patches (plaques) may show up anywhere on your body.  There is no cure for this condition, but treatment can help manage it. Treatment varies, depending on the severity of your condition.  Keep a journal to track what triggers an outbreak. Try to avoid any triggers.  Take or use over-the-counter and prescription medicines only as told by your health care provider.  Keep all follow-up visits as told by your health care provider. This is important. This information is not intended to replace advice given to you by your health care provider. Make sure you discuss any questions you have with your health care provider. Document Released: 04/21/2000 Document Revised: 02/26/2018 Document Reviewed: 02/26/2018 Elsevier Patient Education  2020 [employer].

## 2019-01-17 NOTE — Telephone Encounter (Signed)
Please schedule today

## 2019-01-17 NOTE — Telephone Encounter (Signed)
scheduled

## 2019-01-17 NOTE — Progress Notes (Signed)
Patient: Gina [name] Female    DOB: Mar 06, 1956   63 y.o.   MRN: [id number] Visit Date: 01/17/2019  Today's Provider: Mar Daring, PA-C   Chief Complaint  Patient presents with  . Rash   Subjective:     HPI  Psoriasis From 11/07/2018-seen by Carles Collet. Acute on chronic. I have told her she would be better served by a dermatologist at this point due to the chronicity and refractory nature of her rashes. Treated with prednisone. Patient believes she is having another flare up. Rash is on hips, arms and torso.   She has been stressed with remodeling her home. She states this has been worsening over the last 2 weeks and has large plaques on her mid to low back, buttocks and posterior thighs. Also has present around the waist band area.    Allergies  Allergen Reactions  . Omeprazole Rash    Previously tolerated Prevacid, so do not feel like this is "class wide"     Current Outpatient Medications:  .  acetaminophen (TYLENOL) 325 MG tablet, Take 2 tablets (650 mg total) by mouth every 6 (six) hours as needed., Disp: 30 tablet, Rfl: 0 .  ALPRAZolam (XANAX) 0.5 MG tablet, TAKE 1 TABLET BY MOUTH EVERY DAY AND 1 TABLET AT BEDTIME, Disp: 180 tablet, Rfl: 1 .  amLODipine (NORVASC) 5 MG tablet, Take 5 mg by mouth at bedtime, Disp: 90 tablet, Rfl: 3 .  Azelastine HCl 137 MCG/SPRAY SOLN, Place 1 spray into the nose 2 (two) times daily., Disp: 30 mL, Rfl: 1 .  B COMPLEX VITAMINS PO, Take 1 tablet by mouth daily., Disp: , Rfl:  .  cetirizine (ZYRTEC) 10 MG tablet, Take 10 mg by mouth See admin instructions. Take 10 mg by mouth every other night, Disp: , Rfl:  .  Cholecalciferol (VITAMIN D3) 1000 UNITS CAPS, Take 1,000 Units by mouth at bedtime. , Disp: , Rfl:  .  clotrimazole-betamethasone (LOTRISONE) cream, Apply 1 application topically 2 (two) times daily., Disp: 45 g, Rfl: 1 .  fluticasone (FLONASE) 50 MCG/ACT nasal spray, SPRAY 2 SPRAYS INTO EACH NOSTRIL EVERY DAY  (Patient taking differently: Instill 2 sprays into each nsotril every other day), Disp: 48 g, Rfl: 1 .  ibuprofen (ADVIL) 600 MG tablet, TAKE 1 TABLET BY MOUTH EVERY 6 HOURS AS NEEDED, Disp: 90 tablet, Rfl: 1 .  ketoconazole (NIZORAL) 2 % cream, APPLY 1 APPLICATION TOPICALLY 2 (TWO) TIMES DAILY AS NEEDED FOR IRRITATION., Disp: 30 g, Rfl: 1 .  levothyroxine (SYNTHROID) 25 MCG tablet, TAKE 1 TABLET (25 MCG TOTAL) BY MOUTH DAILY BEFORE BREAKFAST., Disp: 90 tablet, Rfl: 1 .  montelukast (SINGULAIR) 10 MG tablet, TAKE 1 TABLET BY MOUTH EVERY DAY (Patient taking differently: Take 10 mg by mouth every other night), Disp: 90 tablet, Rfl: 3 .  nystatin cream (MYCOSTATIN), APPLY TO AFFECTED AREA TWICE A DAY, Disp: 30 g, Rfl: 0 .  pantoprazole (PROTONIX) 40 MG tablet, TAKE 1 TABLET BY MOUTH EVERY DAY, Disp: 90 tablet, Rfl: 1 .  diphenhydrAMINE (BENADRYL) 25 mg capsule, Take 25 mg by mouth at bedtime., Disp: , Rfl:  .  fluconazole (DIFLUCAN) 150 MG tablet, Take 1 pill on day 1. Take second pill three days later if still having symptoms. (Patient not taking: Reported on 01/17/2019), Disp: 2 tablet, Rfl: 0 .  gabapentin (NEURONTIN) 100 MG capsule, Take 200 mg by mouth 2 (two) times daily., Disp: , Rfl: 3 .  nortriptyline (PAMELOR) 10  MG capsule, TAKE 4 CAPSULES BY MOUTH AT NIGHT, Disp: , Rfl: 3 .  predniSONE (STERAPRED UNI-PAK 21 TAB) 5 MG (21) TBPK tablet, Take 6 pills on day 1, take 5 pills on day 2 and so on until complete. (Patient not taking: Reported on 01/17/2019), Disp: 21 tablet, Rfl: 0  Review of Systems  Constitutional: Negative for appetite change and chills.  Respiratory: Negative for chest tightness.   Cardiovascular: Negative for chest pain and palpitations.  Gastrointestinal: Negative for abdominal pain and nausea.  Skin: Positive for rash.  Neurological: Negative for dizziness and weakness.    Social History   Tobacco Use  . Smoking status: Never Smoker  . Smokeless tobacco: Never Used   Substance Use Topics  . Alcohol use: No    Alcohol/week: 0.0 standard drinks      Objective:   BP 130/82 (BP Location: Left Arm, Patient Position: Sitting, Cuff Size: Large)   Pulse 63   Temp (!) 97.1 F (36.2 C) (Oral)   Resp 16   Ht 5\' 3"  (1.6 m)   Wt 153 lb (69.4 kg)   SpO2 98%   BMI 27.10 kg/m  Vitals:   01/17/19 1626  BP: 130/82  Pulse: 63  Resp: 16  Temp: (!) 97.1 F (36.2 C)  TempSrc: Oral  SpO2: 98%  Weight: 153 lb (69.4 kg)  Height: 5\' 3"  (1.6 m)  Body mass index is 27.1 kg/m.   Physical Exam Vitals signs reviewed.  Constitutional:      General: She is not in acute distress.    Appearance: Normal appearance. She is well-developed. She is not ill-appearing or diaphoretic.  Neck:     Musculoskeletal: Normal range of motion and neck supple.  Cardiovascular:     Rate and Rhythm: Normal rate and regular rhythm.     Pulses: Normal pulses.     Heart sounds: Normal heart sounds. No murmur. No friction rub. No gallop.   Pulmonary:     Effort: Pulmonary effort is normal. No respiratory distress.     Breath sounds: Normal breath sounds. No wheezing or rales.  Skin:    Findings: Erythema and rash (diffuse on back, posterior thighs, buttocks and around waist line) present. Rash is macular and scaling.  Neurological:     Mental Status: She is alert.      No results found for any visits on 01/17/19.     Assessment & Plan    1. Psoriasis Worsening. Will give 40mg  medrol as below. Prednisone oral will be started tomorrow. Dermatology referral placed.  - Ambulatory referral to Dermatology - predniSONE (DELTASONE) 10 MG tablet; Take 1 tablet (10 mg total) by mouth 2 (two) times daily with a meal.  Dispense: 20 tablet; Refill: 0 - methylPREDNISolone acetate (DEPO-MEDROL) injection 40 mg  2. Need for influenza vaccination Flu vaccine given today without complication. Patient sat upright for 15 minutes to check for adverse reaction before being released. - Flu  Vaccine QUAD 36+ mos IM     Mar Daring, PA-C  [facility]

## 2019-02-12 DIAGNOSIS — R42 Dizziness and giddiness: Secondary | ICD-10-CM | POA: Diagnosis not present

## 2019-02-12 DIAGNOSIS — R519 Headache, unspecified: Secondary | ICD-10-CM | POA: Diagnosis not present

## 2019-02-27 ENCOUNTER — Other Ambulatory Visit: Payer: Self-pay | Admitting: Physician Assistant

## 2019-02-27 DIAGNOSIS — E034 Atrophy of thyroid (acquired): Secondary | ICD-10-CM

## 2019-02-27 DIAGNOSIS — I10 Essential (primary) hypertension: Secondary | ICD-10-CM

## 2019-03-12 DIAGNOSIS — Z1283 Encounter for screening for malignant neoplasm of skin: Secondary | ICD-10-CM | POA: Diagnosis not present

## 2019-03-12 DIAGNOSIS — L821 Other seborrheic keratosis: Secondary | ICD-10-CM | POA: Diagnosis not present

## 2019-03-12 DIAGNOSIS — L814 Other melanin hyperpigmentation: Secondary | ICD-10-CM | POA: Diagnosis not present

## 2019-03-12 DIAGNOSIS — L400 Psoriasis vulgaris: Secondary | ICD-10-CM | POA: Diagnosis not present

## 2019-03-24 ENCOUNTER — Other Ambulatory Visit: Payer: Self-pay | Admitting: Physician Assistant

## 2019-03-24 DIAGNOSIS — J301 Allergic rhinitis due to pollen: Secondary | ICD-10-CM

## 2019-03-24 DIAGNOSIS — R1310 Dysphagia, unspecified: Secondary | ICD-10-CM

## 2019-03-24 NOTE — Telephone Encounter (Signed)
Pharmacy called regarding refill request for pantoprazole. Pharmacy states that the patient picked up refill for requested medication on 03/12/19 and that it would be too soon to refill and insurance would not cover medication.  Pt called and states that she did pick up prescription for on 03/12/19 but has lost the prescription and is unable to find it at this time. Pt states she has contacted her insurance company regarding lost prescription and was told that she could have the prescription filled due to losing previous prescription. Prescription resent to pharmacy.

## 2019-03-24 NOTE — Telephone Encounter (Signed)
Patient calling to check status of this refill. Patient will lose her insurance soon and inquired if this could possibly be filled today.

## 2019-03-24 NOTE — Telephone Encounter (Signed)
FYI

## 2019-04-14 ENCOUNTER — Other Ambulatory Visit: Payer: Self-pay | Admitting: Physician Assistant

## 2019-04-14 DIAGNOSIS — F419 Anxiety disorder, unspecified: Secondary | ICD-10-CM

## 2019-04-14 NOTE — Telephone Encounter (Signed)
Requested medication (s) are due for refill today: yes  Requested medication (s) are on the active medication list:yes  Last refill: 11/05/2018  #180 1 refill  Future visit scheduled no  Notes to clinic: not delegated  Requested Prescriptions  Pending Prescriptions Disp Refills   ALPRAZolam (XANAX) 0.5 MG tablet [Pharmacy Med Name: ALPRAZOLAM 0.5 MG TABLET] 180 tablet 1    Sig: TAKE 1 TABLET BY MOUTH EVERY DAY AND 1 TABLET AT BEDTIME     Not Delegated - Psychiatry:  Anxiolytics/Hypnotics Failed - 04/14/2019  3:44 PM      Failed - This refill cannot be delegated      Failed - Urine Drug Screen completed in last 360 days.      Passed - Valid encounter within last 6 months    Recent Outpatient Visits          2 months ago [place], [place], [place]   5 months ago [place] Carles Collet [place], [place]   8 months ago Annual physical exam   [facility] [place], [place]   10 months ago Candidal intertrigo   [place], [place], [place]   1 year ago Postmenopausal vaginal bleeding   [facility] [place], [place], [place]

## 2019-04-15 ENCOUNTER — Other Ambulatory Visit: Payer: Self-pay | Admitting: Physician Assistant

## 2019-04-15 DIAGNOSIS — J301 Allergic rhinitis due to pollen: Secondary | ICD-10-CM

## 2019-05-14 ENCOUNTER — Ambulatory Visit: Payer: BLUE CROSS/BLUE SHIELD | Attending: Internal Medicine

## 2019-05-14 DIAGNOSIS — Z20822 Contact with and (suspected) exposure to covid-19: Secondary | ICD-10-CM

## 2019-05-16 ENCOUNTER — Telehealth: Payer: Self-pay | Admitting: Physician Assistant

## 2019-05-16 LAB — NOVEL CORONAVIRUS, NAA: SARS-CoV-2, NAA: NOT DETECTED

## 2019-05-16 NOTE — Telephone Encounter (Signed)
Negative COVID results given. Patient results "NOT Detected." Caller expressed understanding. ° °
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
## 2019-06-15 ENCOUNTER — Other Ambulatory Visit: Payer: Self-pay | Admitting: Physician Assistant

## 2019-06-15 DIAGNOSIS — R1310 Dysphagia, unspecified: Secondary | ICD-10-CM

## 2019-06-15 NOTE — Telephone Encounter (Signed)
Requested Prescriptions  Pending Prescriptions Disp Refills  . pantoprazole (PROTONIX) 40 MG tablet [Pharmacy Med Name: PANTOPRAZOLE SOD DR 40 MG TAB] 90 tablet 1    Sig: TAKE 1 TABLET BY MOUTH EVERY DAY     Gastroenterology: Proton Pump Inhibitors Passed - 06/15/2019  9:42 AM      Passed - Valid encounter within last 12 months    Recent Outpatient Visits          4 months ago [place], [place], [place]   7 months ago [place] Carles Collet M, [place]   10 months ago Annual physical exam   [place], [place], [place]   1 year ago Candidal intertrigo   [place], [place], [place]   1 year ago Postmenopausal vaginal bleeding   [facility] [place], [place], [place]

## 2019-06-27 ENCOUNTER — Telehealth: Payer: Self-pay | Admitting: Physician Assistant

## 2019-06-27 ENCOUNTER — Telehealth: Payer: Self-pay

## 2019-06-27 ENCOUNTER — Ambulatory Visit (INDEPENDENT_AMBULATORY_CARE_PROVIDER_SITE_OTHER): Payer: 59 | Admitting: Physician Assistant

## 2019-06-27 DIAGNOSIS — N3000 Acute cystitis without hematuria: Secondary | ICD-10-CM

## 2019-06-27 MED ORDER — SULFAMETHOXAZOLE-TRIMETHOPRIM 800-160 MG PO TABS: 1.0000 | ORAL_TABLET | Freq: Two times a day (BID) | ORAL | 0 refills | Status: DC

## 2019-06-27 NOTE — Telephone Encounter (Signed)
Completed.

## 2019-06-27 NOTE — Progress Notes (Signed)
Patient: Gina [name] Female    DOB: [birth year]   64 y.o.   MRN: [id number] Visit Date: 06/27/2019  Today's Provider: Mar Daring, PA-C   No chief complaint on file.  Subjective:     Virtual Visit via Video Note  I connected with Gina [name] on 06/27/19 at  5:20 PM EST by a video enabled telemedicine application and verified that I am speaking with the correct person using two identifiers.  Location: Patient: Home Provider: [facility]   I discussed the limitations of evaluation and management by telemedicine and the availability of in person appointments. The patient expressed understanding and agreed to proceed.   HPI  Patient with c/o that her clothes (pijamas) smell like urine. She doesn't have no other symptoms. She reports them being dry so she does not think she is leaking urine. She does mention that since she is retired she does not change out of her pajamas as quickly. Does not notice in other clothes. Reports this started a few weeks ago. Denies vaginal discharge or irritation. Was recently placed on Otezla for psoriasis. Feels she may have a UTI.    Allergies  Allergen Reactions  . Omeprazole Rash    Previously tolerated Prevacid, so do not feel like this is "class wide"     Current Outpatient Medications:  .  ALPRAZolam (XANAX) 0.5 MG tablet, TAKE 1 TABLET BY MOUTH EVERY DAY AND 1 TABLET AT BEDTIME, Disp: 60 tablet, Rfl: 5 .  amLODipine (NORVASC) 5 MG tablet, TAKE 1 TABLET BY MOUTH AT BEDTIME, Disp: 90 tablet, Rfl: 3 .  azelastine (ASTELIN) 0.1 % nasal spray, PLACE 1 SPRAY INTO THE NOSE 2 (TWO) TIMES DAILY., Disp: 30 mL, Rfl: 1 .  B COMPLEX VITAMINS PO, Take 1 tablet by mouth daily., Disp: , Rfl:  .  BRYHALI 0.01 % LOTN, Apply to aa's psoriasis BID x 2 wks. Then decrease use to BID 5d/wk. Avoid f/g/a., Disp: , Rfl:  .  cetirizine (ZYRTEC) 10 MG tablet, Take 10 mg by mouth See admin instructions. Take 10 mg by mouth every other night, Disp: , Rfl:    .  Cholecalciferol (VITAMIN D3) 1000 UNITS CAPS, Take 1,000 Units by mouth at bedtime. , Disp: , Rfl:  .  clotrimazole-betamethasone (LOTRISONE) cream, Apply 1 application topically 2 (two) times daily., Disp: 45 g, Rfl: 1 .  diphenhydrAMINE (BENADRYL) 25 mg capsule, Take 25 mg by mouth at bedtime., Disp: , Rfl:  .  fluticasone (FLONASE) 50 MCG/ACT nasal spray, SPRAY 2 SPRAYS INTO EACH NOSTRIL EVERY DAY (Patient taking differently: Instill 2 sprays into each nsotril every other day), Disp: 48 g, Rfl: 1 .  ibuprofen (ADVIL) 600 MG tablet, TAKE 1 TABLET BY MOUTH EVERY 6 HOURS AS NEEDED, Disp: 90 tablet, Rfl: 1 .  levothyroxine (SYNTHROID) 25 MCG tablet, TAKE 1 TABLET (25 MCG TOTAL) BY MOUTH DAILY BEFORE BREAKFAST., Disp: 90 tablet, Rfl: 3 .  montelukast (SINGULAIR) 10 MG tablet, TAKE 1 TABLET BY MOUTH EVERY DAY (Patient taking differently: Take 10 mg by mouth every other night), Disp: 90 tablet, Rfl: 3 .  nortriptyline (PAMELOR) 10 MG capsule, TAKE 4 CAPSULES BY MOUTH AT NIGHT, Disp: , Rfl: 3 .  nystatin cream (MYCOSTATIN), APPLY TO AFFECTED AREA TWICE A DAY, Disp: 30 g, Rfl: 0 .  OTEZLA 30 MG TABS, , Disp: , Rfl:  .  pantoprazole (PROTONIX) 40 MG tablet, TAKE 1 TABLET BY MOUTH EVERY DAY, Disp: 90 tablet, Rfl: 1 .  ketoconazole (NIZORAL) 2 % cream, APPLY 1 APPLICATION TOPICALLY 2 (TWO) TIMES DAILY AS NEEDED FOR IRRITATION., Disp: 30 g, Rfl: 1 .  predniSONE (DELTASONE) 10 MG tablet, Take 1 tablet (10 mg total) by mouth 2 (two) times daily with a meal. (Patient not taking: Reported on 06/27/2019), Disp: 20 tablet, Rfl: 0  Current Facility-Administered Medications:  .  methylPREDNISolone acetate (DEPO-MEDROL) injection 40 mg, 40 mg, Intramuscular, Once, Mar Daring, PA-C  Review of Systems  Constitutional: Negative.   Respiratory: Negative.   Cardiovascular: Negative.   Gastrointestinal: Negative.   Genitourinary: Negative.   Musculoskeletal: Negative.     Social History   Tobacco  Use  . Smoking status: Never Smoker  . Smokeless tobacco: Never Used  Substance Use Topics  . Alcohol use: No    Alcohol/week: 0.0 standard drinks      Objective:   There were no vitals taken for this visit. There were no vitals filed for this visit.There is no height or weight on file to calculate BMI.   Physical Exam Vitals reviewed.  Constitutional:      General: She is not in acute distress. Pulmonary:     Effort: No respiratory distress.  Neurological:     Mental Status: She is alert.      No results found for any visits on 06/27/19.     Assessment & Plan     1. Acute cystitis without hematuria Will treat as a UTI with Bactrim as below. If not improving discussed considering she may have some mild urine leakage overnight. Will consider starting oxybutynin if symptoms persist. She agrees with treatment plan.  - sulfamethoxazole-trimethoprim (BACTRIM DS) 800-160 MG tablet; Take 1 tablet by mouth 2 (two) times daily.  Dispense: 10 tablet; Refill: 0   I discussed the assessment and treatment plan with the patient. The patient was provided an opportunity to ask questions and all were answered. The patient agreed with the plan and demonstrated an understanding of the instructions.   The patient was advised to call back or seek an in-person evaluation if the symptoms worsen or if the condition fails to improve as anticipated.  I provided 14 minutes of non-face-to-face time during this encounter.    Mar Daring, PA-C  [facility]

## 2019-06-27 NOTE — Telephone Encounter (Signed)
Copied from Gerald [id number]. Topic: General - Inquiry >> Jun 27, 2019  4:53 PM Mathis Bud wrote: Reason for CRM: Made patient a same day appt for today for possible UTI.  Patient states may not be able to work video, if not please call the same number, [phone]

## 2019-07-01 ENCOUNTER — Encounter: Payer: Self-pay | Admitting: Physician Assistant

## 2019-07-09 ENCOUNTER — Telehealth: Payer: Self-pay

## 2019-07-09 NOTE — Telephone Encounter (Signed)
Copied from [place] [id number]. Topic: General - Other >> Jul 09, 2019  2:41 PM Rainey Pines A wrote: Patient called to inform Marlyn Corporal that she did an online visit last week and she took medicine she recommended and the medication did not work. Patient was advised to call back to inform her so they could try a different medication. Please advise

## 2019-07-09 NOTE — Telephone Encounter (Signed)
She needs an in office appt or at least to bring a urine specimen to the office for testing.

## 2019-07-09 NOTE — Telephone Encounter (Signed)
Please advise 

## 2019-07-10 ENCOUNTER — Telehealth (INDEPENDENT_AMBULATORY_CARE_PROVIDER_SITE_OTHER): Payer: 59

## 2019-07-10 DIAGNOSIS — N3000 Acute cystitis without hematuria: Secondary | ICD-10-CM

## 2019-07-10 LAB — POCT URINALYSIS DIPSTICK
Bilirubin, UA: NEGATIVE
Blood, UA: NEGATIVE
Glucose, UA: NEGATIVE
Ketones, UA: NEGATIVE
Leukocytes, UA: NEGATIVE
Nitrite, UA: NEGATIVE
Protein, UA: NEGATIVE
Spec Grav, UA: >=1.03 — AB (ref 1.010–1.025)
Urobilinogen, UA: 0.2 E.U./dL
pH, UA: 5 (ref 5.0–8.0)

## 2019-07-10 NOTE — Telephone Encounter (Signed)
Order for UA and Culture. 

## 2019-07-10 NOTE — Telephone Encounter (Signed)
LMTCB to schedule a in office appointment.

## 2019-07-11 ENCOUNTER — Telehealth: Payer: Self-pay

## 2019-07-11 NOTE — Telephone Encounter (Signed)
-----   Message from Mar Daring, [place] sent at 07/10/2019  6:40 PM EST ----- Urine is completely normal with exception it looks like you may be slightly dehydrated. I have sent for culture to see if it does grow any bacteria. May need a vaginal exam if symptoms are persisting, especially if culture is negative.

## 2019-07-11 NOTE — Telephone Encounter (Signed)
Pt advised.   Thanks,   -Serafina Topham  

## 2019-07-14 ENCOUNTER — Telehealth: Payer: Self-pay

## 2019-07-14 LAB — CULTURE, URINE COMPREHENSIVE

## 2019-07-14 NOTE — Telephone Encounter (Signed)
Patient advised as directed below. 

## 2019-07-14 NOTE — Telephone Encounter (Signed)
-----   Message from Mar Daring, [place] sent at 07/14/2019  1:42 PM EST ----- Urine culture is negative for any bacterial growth. If still having symptoms she may require an appt for a pelvic exam

## 2019-07-31 ENCOUNTER — Encounter: Payer: BLUE CROSS/BLUE SHIELD | Admitting: Physician Assistant

## 2019-08-01 ENCOUNTER — Other Ambulatory Visit: Payer: Self-pay

## 2019-08-01 ENCOUNTER — Encounter: Payer: Self-pay | Admitting: Physician Assistant

## 2019-08-01 ENCOUNTER — Ambulatory Visit (INDEPENDENT_AMBULATORY_CARE_PROVIDER_SITE_OTHER): Payer: 59 | Admitting: Physician Assistant

## 2019-08-01 VITALS — BP 131/82 | HR 75 | Temp 96.2°F | Wt 153.0 lb

## 2019-08-01 DIAGNOSIS — Z0000 Encounter for general adult medical examination without abnormal findings: Secondary | ICD-10-CM | POA: Diagnosis not present

## 2019-08-01 DIAGNOSIS — Z23 Encounter for immunization: Secondary | ICD-10-CM | POA: Diagnosis not present

## 2019-08-01 DIAGNOSIS — I10 Essential (primary) hypertension: Secondary | ICD-10-CM

## 2019-08-01 DIAGNOSIS — R87610 Atypical squamous cells of undetermined significance on cytologic smear of cervix (ASC-US): Secondary | ICD-10-CM

## 2019-08-01 DIAGNOSIS — E7800 Pure hypercholesterolemia, unspecified: Secondary | ICD-10-CM | POA: Insufficient documentation

## 2019-08-01 DIAGNOSIS — Z6827 Body mass index (BMI) 27.0-27.9, adult: Secondary | ICD-10-CM

## 2019-08-01 DIAGNOSIS — S81801A Unspecified open wound, right lower leg, initial encounter: Secondary | ICD-10-CM | POA: Diagnosis not present

## 2019-08-01 DIAGNOSIS — Z1239 Encounter for other screening for malignant neoplasm of breast: Secondary | ICD-10-CM | POA: Diagnosis not present

## 2019-08-01 DIAGNOSIS — E559 Vitamin D deficiency, unspecified: Secondary | ICD-10-CM

## 2019-08-01 DIAGNOSIS — E034 Atrophy of thyroid (acquired): Secondary | ICD-10-CM

## 2019-08-01 NOTE — Patient Instructions (Signed)
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
## 2019-08-02 LAB — VITAMIN D 25 HYDROXY (VIT D DEFICIENCY, FRACTURES): Vit D, 25-Hydroxy: 45.4 ng/mL (ref 30.0–100.0)

## 2019-08-02 LAB — CBC WITH DIFFERENTIAL/PLATELET
Basophils Absolute: 0.1 10*3/uL (ref 0.0–0.2)
Basos: 1 %
EOS (ABSOLUTE): 0.2 10*3/uL (ref 0.0–0.4)
Eos: 4 %
Hematocrit: 39.4 % (ref 34.0–46.6)
Hemoglobin: 13.1 g/dL (ref 11.1–15.9)
Immature Grans (Abs): 0 10*3/uL (ref 0.0–0.1)
Immature Granulocytes: 0 %
Lymphocytes Absolute: 1.2 10*3/uL (ref 0.7–3.1)
Lymphs: 27 %
MCH: 30.5 pg (ref 26.6–33.0)
MCHC: 33.2 g/dL (ref 31.5–35.7)
MCV: 92 fL (ref 79–97)
Monocytes Absolute: 0.3 10*3/uL (ref 0.1–0.9)
Monocytes: 7 %
Neutrophils Absolute: 2.8 10*3/uL (ref 1.4–7.0)
Neutrophils: 61 %
Platelets: 285 10*3/uL (ref 150–450)
RBC: 4.29 x10E6/uL (ref 3.77–5.28)
RDW: 13.3 % (ref 11.7–15.4)
WBC: 4.6 10*3/uL (ref 3.4–10.8)

## 2019-08-02 LAB — COMPREHENSIVE METABOLIC PANEL
ALT: 13 IU/L (ref 0–32)
AST: 18 IU/L (ref 0–40)
Albumin/Globulin Ratio: 2.1 (ref 1.2–2.2)
Albumin: 4.6 g/dL (ref 3.8–4.8)
Alkaline Phosphatase: 101 IU/L (ref 39–117)
BUN/Creatinine Ratio: 15 (ref 12–28)
BUN: 11 mg/dL (ref 8–27)
Bilirubin Total: <0.2 mg/dL (ref 0.0–1.2)
CO2: 27 mmol/L (ref 20–29)
Calcium: 9.9 mg/dL (ref 8.7–10.3)
Chloride: 104 mmol/L (ref 96–106)
Creatinine, Ser: 0.74 mg/dL (ref 0.57–1.00)
GFR calc Af Amer: 101 mL/min/{1.73_m2} (ref >59)
GFR calc non Af Amer: 88 mL/min/{1.73_m2} (ref >59)
Globulin, Total: 2.2 g/dL (ref 1.5–4.5)
Glucose: 66 mg/dL (ref 65–99)
Potassium: 3.7 mmol/L (ref 3.5–5.2)
Sodium: 143 mmol/L (ref 134–144)
Total Protein: 6.8 g/dL (ref 6.0–8.5)

## 2019-08-02 LAB — LIPID PANEL WITH LDL/HDL RATIO
Cholesterol, Total: 200 mg/dL — ABNORMAL HIGH (ref 100–199)
HDL: 64 mg/dL (ref >39)
LDL Chol Calc (NIH): 114 mg/dL — ABNORMAL HIGH (ref 0–99)
LDL/HDL Ratio: 1.8 ratio (ref 0.0–3.2)
Triglycerides: 125 mg/dL (ref 0–149)
VLDL Cholesterol Cal: 22 mg/dL (ref 5–40)

## 2019-08-02 LAB — TSH: TSH: 2.41 u[IU]/mL (ref 0.450–4.500)

## 2019-08-04 ENCOUNTER — Telehealth: Payer: Self-pay

## 2019-08-04 NOTE — Telephone Encounter (Signed)
LMTCB if patient calls back OK for the [facility] to give results.

## 2019-08-04 NOTE — Telephone Encounter (Signed)
-----   Message from Mar Daring, [place] sent at 08/04/2019  1:54 PM EDT ----- Blood count is normal. Kidney and liver function are normal. Sugar is normal. Sodium, potassium, and calcium are normal. Thyroid is normal. Cholesterol is stable. Vit D is normal.

## 2019-08-05 ENCOUNTER — Telehealth: Payer: Self-pay | Admitting: Physician Assistant

## 2019-08-05 NOTE — Telephone Encounter (Signed)
Pt given lab results per notes of Fenton Malling on 08/01/19. Pt verbalized understanding.

## 2019-08-20 ENCOUNTER — Encounter: Payer: Self-pay | Admitting: Dermatology

## 2019-08-20 ENCOUNTER — Ambulatory Visit (INDEPENDENT_AMBULATORY_CARE_PROVIDER_SITE_OTHER): Payer: 59 | Admitting: Dermatology

## 2019-08-20 ENCOUNTER — Other Ambulatory Visit: Payer: Self-pay

## 2019-08-20 DIAGNOSIS — L409 Psoriasis, unspecified: Secondary | ICD-10-CM | POA: Diagnosis not present

## 2019-08-20 DIAGNOSIS — L304 Erythema intertrigo: Secondary | ICD-10-CM | POA: Diagnosis not present

## 2019-08-20 NOTE — Progress Notes (Signed)
   Follow-Up Visit   Subjective  Gina [name] is a 64 y.o. female who presents for the following: Psoriasis (Taking Otezla 30mg  1 po qd, Bryhali lotion qd-bid prn flares. Has a few flared spots on legs but overall seems better. She has had some mild diarrhea in the last couple weeks.) and Rash (under breasts. Gets irritated.).    The following portions of the chart were reviewed this encounter and updated as appropriate: Tobacco  Allergies  Meds  Problems  Med Hx  Surg Hx  Fam Hx      Review of Systems: No other skin or systemic complaints.  Objective  Well appearing patient in no apparent distress; mood and affect are within normal limits.  A focused examination was performed including legs, inframammary areas. Relevant physical exam findings are noted in the Assessment and Plan.  Objective  Inframammary areas: Erythema.  Objective  Right Lower Leg - Anterior: Pink patches of legs.  Assessment & Plan  Erythema intertrigo +/- psoriasis inversa Inframammary areas  Intertrigo with possible element of Psoriasis Inversa  Start Iodoquinol 1%/Hydrocortisone 2.5%/Niacinamide 2% cream bid. 30g 3RF sent to [facility].  Recommend Zeasorb AF powder.   Psoriasis, severe on systemic Otezla.  She is doing well and is pleased with her treatment despite the fact that she is still having active areas Right Lower Leg - Anterior  Continue Otezla 30mg  1 po qd, Bryhali lotion qd-bid prn flares.  Discussed increasing Otezla to bid. Patient declines today due to past GI upset with [place] Nail.  Return in about 6 months (around 02/19/2020).   I, Ashok Cordia, CMA, am acting as scribe for Sarina Ser, MD .

## 2019-08-22 ENCOUNTER — Ambulatory Visit: Payer: Self-pay | Attending: Oncology

## 2019-08-22 DIAGNOSIS — Z23 Encounter for immunization: Secondary | ICD-10-CM

## 2019-08-22 NOTE — Progress Notes (Signed)
   Covid-19 Vaccination Clinic  Name:  Gina [name]    MRN: [id number] DOB: [birth year]  08/22/2019  [name] was observed post Covid-19 immunization for 15 minutes without incident. She was provided with Vaccine Information Sheet and instruction to access the V-Safe system.   [name] was instructed to call 911 with any severe reactions post vaccine: Marland Kitchen Difficulty breathing  . Swelling of face and throat  . A fast heartbeat  . A bad rash all over body  . Dizziness and weakness   Immunizations Administered    Name Date Dose VIS Date Route   Pfizer COVID-19 Vaccine 08/22/2019 10:01 AM 0.3 mL 04/18/2019 Intramuscular   Manufacturer: [employer], [employer]   Lot: [id number]   [place]: [id number]

## 2019-09-08 ENCOUNTER — Other Ambulatory Visit: Payer: Self-pay | Admitting: Physician Assistant

## 2019-09-08 DIAGNOSIS — J301 Allergic rhinitis due to pollen: Secondary | ICD-10-CM

## 2019-09-16 ENCOUNTER — Ambulatory Visit: Payer: 59 | Attending: Internal Medicine

## 2019-09-16 DIAGNOSIS — Z23 Encounter for immunization: Secondary | ICD-10-CM

## 2019-09-16 NOTE — Progress Notes (Signed)
   Covid-19 Vaccination Clinic  Name:  Gina [name]    MRN: [id number] DOB: [birth year]  09/16/2019  [name] was observed post Covid-19 immunization for 15 minutes without incident. She was provided with Vaccine Information Sheet and instruction to access the V-Safe system.   [name] was instructed to call 911 with any severe reactions post vaccine: Marland Kitchen Difficulty breathing  . Swelling of face and throat  . A fast heartbeat  . A bad rash all over body  . Dizziness and weakness   Immunizations Administered    Name Date Dose VIS Date Route   Pfizer COVID-19 Vaccine 09/16/2019 11:54 AM 0.3 mL 07/02/2018 Intramuscular   Manufacturer: [place]   Lot: [id number]   [place]: [id number]

## 2019-10-22 ENCOUNTER — Telehealth: Payer: Self-pay

## 2019-10-22 NOTE — Telephone Encounter (Signed)
If patient is not on any lipid meds and if liver is normal, may send in  Diflucan 200 mg take 1 po for yeast infection.  Repeat if necessary dispense #2 with 1 rf.  If she is on Lipid meds, then must stop for a couple days when taking Diflucan.

## 2019-10-22 NOTE — Telephone Encounter (Signed)
Patient called regarding a possible yeast infection. She states she discussed with you briefly at her last appointment and you mentioned for her to call and we could send something in for her.

## 2019-10-23 MED ORDER — FLUCONAZOLE 200 MG PO TABS: 200.0000 mg | ORAL_TABLET | ORAL | 1 refills | Status: DC

## 2019-10-23 NOTE — Telephone Encounter (Signed)
Left message for patient to return my call.

## 2019-10-23 NOTE — Telephone Encounter (Signed)
Patient advised. She declines any lipid medications or having liver issues. Diflucan sent in to [facility] in [place].

## 2019-10-29 ENCOUNTER — Other Ambulatory Visit: Payer: Self-pay | Admitting: Physician Assistant

## 2019-10-29 DIAGNOSIS — J301 Allergic rhinitis due to pollen: Secondary | ICD-10-CM

## 2019-10-29 NOTE — Telephone Encounter (Signed)
Approved per protocol.  

## 2019-11-03 ENCOUNTER — Other Ambulatory Visit: Payer: Self-pay | Admitting: Physician Assistant

## 2019-11-03 DIAGNOSIS — F419 Anxiety disorder, unspecified: Secondary | ICD-10-CM

## 2019-11-03 DIAGNOSIS — R1310 Dysphagia, unspecified: Secondary | ICD-10-CM

## 2019-11-03 NOTE — Telephone Encounter (Signed)
Requested medication (s) are due for refill today: Yes  Requested medication (s) are on the active medication list: Yes  Last refill:  04/14/19  Future visit scheduled: No  Notes to clinic:  See request.    Requested Prescriptions  Pending Prescriptions Disp Refills   ALPRAZolam (XANAX) 0.5 MG tablet [Pharmacy Med Name: ALPRAZOLAM 0.5 MG TABLET] 60 tablet     Sig: TAKE 1 TABLET BY MOUTH EVERY DAY AND 1 TABLET AT BEDTIME      Not Delegated - Psychiatry:  Anxiolytics/Hypnotics Failed - 11/03/2019 11:39 AM      Failed - This refill cannot be delegated      Failed - Urine Drug Screen completed in last 360 days.      Passed - Valid encounter within last 6 months    Recent Outpatient Visits           3 months ago Annual physical exam   [place], [place]   4 months ago Acute cystitis without hematuria   [place], Clearnce Sorrel, [place]   9 months ago [place], Jennifer M, [place]   12 months ago [place], Wendee Beavers, [place]   1 year ago Annual physical exam   [place], Clearnce Sorrel, [place]       Future Appointments             In 3 months Ralene Bathe, MD [facility]

## 2019-11-26 ENCOUNTER — Other Ambulatory Visit: Payer: Self-pay | Admitting: Physician Assistant

## 2019-11-26 DIAGNOSIS — J301 Allergic rhinitis due to pollen: Secondary | ICD-10-CM

## 2019-12-04 ENCOUNTER — Ambulatory Visit: Payer: 59

## 2019-12-08 ENCOUNTER — Encounter: Payer: Self-pay | Admitting: Physician Assistant

## 2019-12-08 ENCOUNTER — Other Ambulatory Visit: Payer: Self-pay

## 2019-12-08 ENCOUNTER — Ambulatory Visit (INDEPENDENT_AMBULATORY_CARE_PROVIDER_SITE_OTHER): Payer: 59 | Admitting: Physician Assistant

## 2019-12-08 VITALS — BP 135/79 | HR 73 | Temp 98.1°F | Resp 16 | Wt 152.0 lb

## 2019-12-08 DIAGNOSIS — M79671 Pain in right foot: Secondary | ICD-10-CM
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
## 2019-12-08 NOTE — Patient Instructions (Signed)

## 2019-12-09 ENCOUNTER — Ambulatory Visit
Admission: RE | Admit: 2019-12-09 | Discharge: 2019-12-09 | Disposition: A | Discharge status: Home / Self Care | Payer: 59 | Source: Ambulatory Visit | Attending: Physician Assistant | Admitting: Physician Assistant

## 2019-12-09 ENCOUNTER — Telehealth: Payer: Self-pay

## 2019-12-09 ENCOUNTER — Other Ambulatory Visit: Payer: Self-pay

## 2019-12-09 ENCOUNTER — Ambulatory Visit
Admission: RE | Admit: 2019-12-09 | Discharge: 2019-12-09 | Disposition: A | Discharge status: Home / Self Care | Payer: 59 | Attending: Physician Assistant | Admitting: Physician Assistant

## 2019-12-09 DIAGNOSIS — M79671 Pain in right foot: Secondary | ICD-10-CM | POA: Insufficient documentation

## 2019-12-09 MED ORDER — IBUPROFEN 600 MG PO TABS: 600.0000 mg | ORAL_TABLET | Freq: Four times a day (QID) | ORAL | 1 refills | Status: DC | PRN

## 2019-12-09 NOTE — Telephone Encounter (Signed)
Pt advised.  She is going to hold off on the referral for now, but will call back if she changes her mind.  Pt states she needs a refill on her Ibuprofen or Meloxicam (which ever you think is best.)  [facility].   Thanks,   Mickel Baas

## 2019-12-09 NOTE — Telephone Encounter (Signed)
-----   Message from Mar Daring, [place] sent at 12/09/2019  4:15 PM EDT ----- Noted to have fractures of the 3rd and 4th toes. I am not sure if there is much that is done about toe fractures. Normally we will buddy tape the toes together to "splint" the toe essentially. I can refer to podiatry if preferred for further evaluation to make sure there is nothing else to be done.

## 2019-12-16 ENCOUNTER — Other Ambulatory Visit: Payer: Self-pay

## 2019-12-16 ENCOUNTER — Ambulatory Visit
Admission: RE | Admit: 2019-12-16 | Discharge: 2019-12-16 | Disposition: A | Discharge status: Home / Self Care | Payer: 59 | Source: Ambulatory Visit | Attending: Physician Assistant | Admitting: Physician Assistant

## 2019-12-16 DIAGNOSIS — Z1231 Encounter for screening mammogram for malignant neoplasm of breast: Secondary | ICD-10-CM | POA: Insufficient documentation

## 2019-12-16 DIAGNOSIS — Z1239 Encounter for other screening for malignant neoplasm of breast: Secondary | ICD-10-CM

## 2019-12-18 ENCOUNTER — Other Ambulatory Visit: Payer: Self-pay | Admitting: Physician Assistant

## 2019-12-18 ENCOUNTER — Ambulatory Visit: Payer: Self-pay

## 2019-12-18 MED ORDER — NYSTATIN 100000 UNIT/GM EX CREA: 1.0000 "application " | TOPICAL_CREAM | Freq: Two times a day (BID) | CUTANEOUS | 0 refills | Status: DC

## 2019-12-18 NOTE — Telephone Encounter (Signed)
Will send in Nystatin

## 2019-12-18 NOTE — Progress Notes (Signed)
Patient advised as directed below. 

## 2019-12-18 NOTE — Progress Notes (Signed)
Nystatin cream sent in

## 2019-12-18 NOTE — Telephone Encounter (Signed)
Pt. Reports she has a rash under both breast "that Anderson Malta has treated before. I think it's from the heat." No open skin areas or other symptoms. [facility] [place].  Answer Assessment - Initial Assessment Questions 1. APPEARANCE of RASH: "Describe the rash."      Red 2. LOCATION: "Where is the rash located?"      Under both breast 3. NUMBER: "How many spots are there?"      Red skin 4. SIZE: "How big are the spots?" (Inches, centimeters or compare to size of a coin)      n/a 5. ONSET: "When did the rash start?"      Started 2 days ago 6. ITCHING: "Does the rash itch?" If Yes, ask: "How bad is the itch?"  (Scale 1-10; or mild, moderate, severe)     Mild 7. PAIN: "Does the rash hurt?" If Yes, ask: "How bad is the pain?"  (Scale 1-10; or mild, moderate, severe)     No 8. OTHER SYMPTOMS: "Do you have any other symptoms?" (e.g., fever)     No 9. PREGNANCY: "Is there any chance you are pregnant?" "When was your last menstrual period?"     No  Protocols used: RASH OR REDNESS - LOCALIZED-A-AH

## 2019-12-30 ENCOUNTER — Other Ambulatory Visit: Payer: Self-pay | Admitting: Physician Assistant

## 2019-12-30 DIAGNOSIS — J301 Allergic rhinitis due to pollen: Secondary | ICD-10-CM

## 2020-01-21 ENCOUNTER — Other Ambulatory Visit: Payer: Self-pay | Admitting: Physician Assistant

## 2020-01-21 DIAGNOSIS — I10 Essential (primary) hypertension: Secondary | ICD-10-CM

## 2020-01-27 ENCOUNTER — Other Ambulatory Visit: Payer: Self-pay | Admitting: Physician Assistant

## 2020-01-27 DIAGNOSIS — I10 Essential (primary) hypertension: Secondary | ICD-10-CM

## 2020-01-30 ENCOUNTER — Other Ambulatory Visit: Payer: Self-pay | Admitting: Physician Assistant

## 2020-01-30 NOTE — Telephone Encounter (Signed)
Requested  medications are  due for refill today yes  Requested medications are on the active medication list yes  Last refill 8/23  Last visit 12/2019  Future visit scheduled no  Notes to clinic Not sure that this med was to be continued, please assess.

## 2020-02-05 ENCOUNTER — Other Ambulatory Visit: Payer: Self-pay | Admitting: Physician Assistant

## 2020-02-05 DIAGNOSIS — J301 Allergic rhinitis due to pollen: Secondary | ICD-10-CM

## 2020-02-26 ENCOUNTER — Ambulatory Visit: Payer: Self-pay | Admitting: Dermatology

## 2020-03-01 ENCOUNTER — Other Ambulatory Visit: Payer: Self-pay | Admitting: Physician Assistant

## 2020-03-01 DIAGNOSIS — J301 Allergic rhinitis due to pollen: Secondary | ICD-10-CM

## 2020-03-04 ENCOUNTER — Telehealth: Payer: Self-pay

## 2020-03-04 NOTE — Telephone Encounter (Signed)
Called patient and no answer, left vm for patient to return call.

## 2020-03-04 NOTE — Telephone Encounter (Signed)
Copied from [place] [phone]. Topic: General - Inquiry >> Mar 03, 2020  4:17 PM Alease Frame wrote: Reason for CRM: Pt is calling to inform Burnette's nurse regarding a possible cyst on vaginal area. She is wanting to speak with nurse about . Please advise

## 2020-03-05 NOTE — Telephone Encounter (Signed)
Returned patient's call and patient was advised Tawanna Sat was out until Monday,03/08/2020. She reports that the lump has gone down and she think is getting better. Patient reports she believes it may have came from her switching from shorts to jeans during weather change. Patient states she will call if need appointment on Monday,03/08/2020.

## 2020-03-05 NOTE — Telephone Encounter (Signed)
Patient is returning a call to [facility].  Please call patient back at [phone]

## 2020-03-11 ENCOUNTER — Other Ambulatory Visit: Payer: Self-pay | Admitting: Physician Assistant

## 2020-03-16 ENCOUNTER — Other Ambulatory Visit: Payer: Self-pay | Admitting: Physician Assistant

## 2020-03-16 DIAGNOSIS — E034 Atrophy of thyroid (acquired): Secondary | ICD-10-CM

## 2020-03-16 DIAGNOSIS — I10 Essential (primary) hypertension: Secondary | ICD-10-CM

## 2020-03-28 ENCOUNTER — Other Ambulatory Visit: Payer: Self-pay | Admitting: Physician Assistant

## 2020-03-28 DIAGNOSIS — J301 Allergic rhinitis due to pollen: Secondary | ICD-10-CM

## 2020-04-18 ENCOUNTER — Other Ambulatory Visit: Payer: Self-pay | Admitting: Dermatology

## 2020-04-18 ENCOUNTER — Other Ambulatory Visit: Payer: Self-pay | Admitting: Physician Assistant

## 2020-04-18 NOTE — Telephone Encounter (Signed)
Requested medication (s) are due for refill today: yes  Requested medication (s) are on the active medication list: yes  Last refill:  12/18/19  Future visit scheduled: no  Notes to clinic:  med not assigned to a protocol   Requested Prescriptions  Pending Prescriptions Disp Refills   nystatin cream (MYCOSTATIN) [Pharmacy Med Name: NYSTATIN 100,000 UNIT/GM CREAM] 30 g 0    Sig: APPLY TO AFFECTED AREA TWICE A DAY      Off-Protocol Failed - 04/18/2020  2:29 PM      Failed - Medication not assigned to a protocol, review manually.      Passed - Valid encounter within last 12 months    Recent Outpatient Visits           4 months ago Right foot pain   [facility] [place], Anderson Malta [place], [place]   8 months ago Annual physical exam   [place], [place]   9 months ago Acute cystitis without hematuria   [facility] [place], Clearnce Sorrel, [place]   1 year ago [place], Clearnce Sorrel, [place]   1 year ago Edmund Carles [place], [place]

## 2020-04-19 ENCOUNTER — Telehealth: Payer: Self-pay

## 2020-04-19 NOTE — Telephone Encounter (Signed)
Pt was calling to request a refill on Fluconazole.  (See refill message 04/18/2020)  Thanks,   -Mickel Baas

## 2020-04-19 NOTE — Telephone Encounter (Signed)
Copied from [place]. Topic: General - Other >> Apr 19, 2020  2:50 PM Hinda Lenis D wrote: PT need a call back from a nurse team, personal / please advise

## 2020-04-20 ENCOUNTER — Other Ambulatory Visit: Payer: Self-pay | Admitting: Physician Assistant

## 2020-04-20 DIAGNOSIS — J301 Allergic rhinitis due to pollen: Secondary | ICD-10-CM

## 2020-04-21 ENCOUNTER — Other Ambulatory Visit: Payer: Self-pay | Admitting: Dermatology

## 2020-04-27 ENCOUNTER — Other Ambulatory Visit: Payer: Self-pay

## 2020-04-27 DIAGNOSIS — L409 Psoriasis, unspecified: Secondary | ICD-10-CM

## 2020-04-27 MED ORDER — OTEZLA 30 MG PO TABS: 30.0000 mg | ORAL_TABLET | Freq: Every day | ORAL | 2 refills | Status: DC

## 2020-04-27 NOTE — Progress Notes (Signed)
Refills sent to last to f/u on 06/14/20 that was made today.

## 2020-05-03 ENCOUNTER — Telehealth: Payer: Self-pay | Admitting: Physician Assistant

## 2020-05-03 DIAGNOSIS — R059 Cough, unspecified: Secondary | ICD-10-CM

## 2020-05-03 NOTE — Telephone Encounter (Signed)
Patient reports bumps in her throat have cleared. Reports cough started yesterday with sinus pain and pressure. Patient denies any ear pain or runny nose or fever. Patient reports using azelastine for nasal congestion. Please advise.

## 2020-05-03 NOTE — Telephone Encounter (Signed)
Can add Mucinex or Mucinex DM for congestion and cough. If symptoms worsen or do not improve over the next 5-7 days she will need a virtual visit. We can order covid testing in the meantime, if she would like to be tested for this.

## 2020-05-03 NOTE — Telephone Encounter (Signed)
Patient advised as below. Patient verbalizes understanding and is in agreement with treatment plan.  

## 2020-05-03 NOTE — Telephone Encounter (Signed)
Pt called stating that she has bumps in her throat, cough, and congested. Pt is requesting ti have something else for her. Please advise.      [facility] [address] [place], [place] - [address]  [address] [place] [address]  Phone: [phone] Fax: [phone]  Hours: Not open 24 hours

## 2020-05-04 NOTE — Addendum Note (Signed)
Addended by: Marjie Skiff on: 05/04/2020 01:43 PM   Modules accepted: Orders

## 2020-05-04 NOTE — Telephone Encounter (Signed)
Pt called and is requesting to have covid test set up for this evening. Please advise.

## 2020-05-05 ENCOUNTER — Ambulatory Visit: Payer: Self-pay | Admitting: *Deleted

## 2020-05-05 LAB — SARS-COV-2, NAA 2 DAY TAT

## 2020-05-05 LAB — NOVEL CORONAVIRUS, NAA: SARS-CoV-2, NAA: NOT DETECTED

## 2020-05-05 MED ORDER — BENZONATATE 200 MG PO CAPS: 200.0000 mg | ORAL_CAPSULE | Freq: Three times a day (TID) | ORAL | 0 refills | Status: DC | PRN

## 2020-05-05 NOTE — Telephone Encounter (Signed)
I returned her call.   She can't get rid of the cough even with the Mucinex.  She was tested for covid on either Mon. Or Tues.  She's waiting on her result. It really bothers me at night.  I'm so tired because I can't sleep.  She has not been taking the Zyrtec and was wondering if she should try taking that at night?  I let her know I would send Joycelyn Man, PA-C a note letting her know what is going on with the cough.  Pt mentioned the [employer] she takes with the bronchitis really helps but didn't know if it would help in this situation or not.  I let her know someone would call her back with Jennifer's decision.   She was agreeable to this plan.  I sent my notes to [facility].  Reason for Disposition  [1] Continuous (nonstop) coughing interferes with work or school AND [2] no improvement using cough treatment per protocol  Answer Assessment - Initial Assessment Questions 1. ONSET: "When did the cough begin?"      I'm taking Mucinex but my coughing is not going away.  I've been vaccinated.   Can I take Zyrtec?   2. SEVERITY: "How bad is the cough today?"      I can't sleep because of the cough.    3. SPUTUM: "Describe the color of your sputum" (none, dry cough; clear, white, yellow, green)     It's mostly a dry cough sometimes I cough up a white mucus.  4. HEMOPTYSIS: "Are you coughing up any blood?" If so ask: "How much?" (flecks, streaks, tablespoons, etc.)     No 5. DIFFICULTY BREATHING: "Are you having difficulty breathing?" If Yes, ask: "How bad is it?" (e.g., mild, moderate, severe)    - MILD: No SOB at rest, mild SOB with walking, speaks normally in sentences, can lay down, no retractions, pulse < 100.    - MODERATE: SOB at rest, SOB with minimal exertion and prefers to sit, cannot lie down flat, speaks in phrases, mild retractions, audible wheezing, pulse 100-120.    - SEVERE: Very SOB at rest, speaks in single words, struggling to breathe, sitting  hunched forward, retractions, pulse > 120      No 6. FEVER: "Do you have a fever?" If Yes, ask: "What is your temperature, how was it measured, and when did it start?"     No fever 7. CARDIAC HISTORY: "Do you have any history of heart disease?" (e.g., heart attack, congestive heart failure)      No 8. LUNG HISTORY: "Do you have any history of lung disease?"  (e.g., pulmonary embolus, asthma, emphysema)     No 9. PE RISK FACTORS: "Do you have a history of blood clots?" (or: recent major surgery, recent prolonged travel, bedridden)     No 10. OTHER SYMPTOMS: "Do you have any other symptoms?" (e.g., runny nose, wheezing, chest pain)       No just tired from not sleeping.  Still have some nasal congestion which makes my ears pop. 11. PREGNANCY: "Is there any chance you are pregnant?" "When was your last menstrual period?"       N/A due to age 64. TRAVEL: "Have you traveled out of the country in the last month?" (e.g., travel history, exposures)       No  Protocols used: COUGH - ACUTE NON-PRODUCTIVE-A-AH

## 2020-05-05 NOTE — Telephone Encounter (Signed)
Patient advised as below.  

## 2020-05-05 NOTE — Addendum Note (Signed)
Addended by: Margaretann Loveless on: 05/05/2020 02:43 PM   Modules accepted: Orders

## 2020-05-05 NOTE — Telephone Encounter (Signed)
I will send in Tessalon perles.

## 2020-05-06 ENCOUNTER — Telehealth: Payer: Self-pay

## 2020-05-06 ENCOUNTER — Ambulatory Visit: Payer: Self-pay

## 2020-05-06 MED ORDER — ALBUTEROL SULFATE HFA 108 (90 BASE) MCG/ACT IN AERS: 2.0000 | INHALATION_SPRAY | Freq: Four times a day (QID) | RESPIRATORY_TRACT | 0 refills | Status: DC | PRN

## 2020-05-06 MED ORDER — BENZONATATE 200 MG PO CAPS: 200.0000 mg | ORAL_CAPSULE | Freq: Three times a day (TID) | ORAL | 0 refills | Status: DC | PRN

## 2020-05-06 NOTE — Telephone Encounter (Signed)
   DR    2        Gina [name] Female, 64 y.o., 1956-02-20  MRN:  [id number] Phone:  [phone] (M)       PCP:  Margaretann Loveless, PA-C Coverage:  [facility]  Next Appt With Dermatology 06/14/2020 at 1:45 PM         Message from Crist Infante sent at 05/06/2020 8:41 AM EST  Pt called yesterday and was called by NT. She is calling again this morning with concerns she is not getting any better, if anything she is worse. Her concern this morning is that when she lays done, she can hear rattling in her chest. She asked that a nurse call her back again. (FYI the tessalon pearls were not phoned in. I am sending message to the practice)    Call History   Type Contact Phone/Fax User  05/06/2020 08:36 AM EST Phone (Incoming) [name], Gina Zender (Self) [phone] Judie Petit) Gina [name]   Pt. States pharmacy does not have order for Tessalon. States she coughed all night and now has "rattling and wheezing in my chest." Cough is non-productive. No fever. Spoke with Roshena in the practice. Will send triage for review. Please advise pt.  Reason for Disposition . SEVERE coughing spells (e.g., whooping sound after coughing, vomiting after coughing)  Answer Assessment - Initial Assessment Questions 1. ONSET: "When did the cough begin?"      Sunday 2. SEVERITY: "How bad is the cough today?"      Severe 3. SPUTUM: "Describe the color of your sputum" (none, dry cough; clear, white, yellow, green)     None 4. HEMOPTYSIS: "Are you coughing up any blood?" If so ask: "How much?" (flecks, streaks, tablespoons, etc.)     No 5. DIFFICULTY BREATHING: "Are you having difficulty breathing?" If Yes, ask: "How bad is it?" (e.g., mild, moderate, severe)    - MILD: No SOB at rest, mild SOB with walking, speaks normally in sentences, can lay down, no retractions, pulse < 100.    - MODERATE: SOB at rest, SOB with minimal exertion and prefers to sit, cannot  lie down flat, speaks in phrases, mild retractions, audible wheezing, pulse 100-120.    - SEVERE: Very SOB at rest, speaks in single words, struggling to breathe, sitting hunched forward, retractions, pulse > 120      No 6. FEVER: "Do you have a fever?" If Yes, ask: "What is your temperature, how was it measured, and when did it start?"     No 7. CARDIAC HISTORY: "Do you have any history of heart disease?" (e.g., heart attack, congestive heart failure)      No 8. LUNG HISTORY: "Do you have any history of lung disease?"  (e.g., pulmonary embolus, asthma, emphysema)     No 9. PE RISK FACTORS: "Do you have a history of blood clots?" (or: recent major surgery, recent prolonged travel, bedridden)     No 10. OTHER SYMPTOMS: "Do you have any other symptoms?" (e.g., runny nose, wheezing, chest pain)       Wheezing 11. PREGNANCY: "Is there any chance you are pregnant?" "When was your last menstrual period?"       No 12. TRAVEL: "Have you traveled out of the country in the last month?" (e.g., travel history, exposures)       No  Protocols used: COUGH - ACUTE NON-PRODUCTIVE-A-AH

## 2020-05-06 NOTE — Telephone Encounter (Signed)
Albuterol and benzonatate was send in electronically this morning around 9 am. Please see other CRM/phone note.

## 2020-05-06 NOTE — Telephone Encounter (Signed)
Patient advised.

## 2020-05-06 NOTE — Telephone Encounter (Signed)
Patient called the office stating that the prescription for Jerilynn Som is not at the pharmacy. Was this prescription ever called in? The medication disposition in her chart says "phone in" . Please call patient to let her know when it has been sent into pharmacy. Also see today's triage note from [facility] RN. Patient is now having some wheezing symptoms along with cough.

## 2020-05-06 NOTE — Telephone Encounter (Signed)
The pharmacist could not find where the  benzonatate (TESSALON) 200 MG capsule  were phoned in.  Victorino Dike the pharmacist asking if you could resend the Rx please.  [id number] [place] [place] - [address]

## 2020-05-06 NOTE — Telephone Encounter (Signed)
Already addressed in another phone message

## 2020-05-06 NOTE — Telephone Encounter (Signed)
Sent in albuterol and tessalon

## 2020-05-11 ENCOUNTER — Ambulatory Visit
Admission: RE | Admit: 2020-05-11 | Discharge: 2020-05-11 | Disposition: A | Discharge status: Home / Self Care | Payer: 59 | Source: Ambulatory Visit | Attending: Adult Health | Admitting: Adult Health

## 2020-05-11 ENCOUNTER — Encounter: Payer: Self-pay | Admitting: Adult Health

## 2020-05-11 ENCOUNTER — Other Ambulatory Visit: Payer: Self-pay

## 2020-05-11 ENCOUNTER — Other Ambulatory Visit
Admission: RE | Admit: 2020-05-11 | Discharge: 2020-05-11 | Disposition: A | Discharge status: Home / Self Care | Payer: 59 | Source: Ambulatory Visit | Attending: Adult Health | Admitting: Adult Health

## 2020-05-11 ENCOUNTER — Telehealth (INDEPENDENT_AMBULATORY_CARE_PROVIDER_SITE_OTHER): Payer: 59 | Admitting: Adult Health

## 2020-05-11 ENCOUNTER — Other Ambulatory Visit: Payer: Self-pay | Admitting: Adult Health

## 2020-05-11 DIAGNOSIS — R059 Cough, unspecified: Secondary | ICD-10-CM | POA: Diagnosis not present

## 2020-05-11 DIAGNOSIS — J069 Acute upper respiratory infection, unspecified: Secondary | ICD-10-CM | POA: Diagnosis not present

## 2020-05-11 DIAGNOSIS — L409 Psoriasis, unspecified: Secondary | ICD-10-CM

## 2020-05-11 DIAGNOSIS — D720 Genetic anomalies of leukocytes: Secondary | ICD-10-CM

## 2020-05-11 LAB — COMPREHENSIVE METABOLIC PANEL
ALT: 15 U/L (ref 0–44)
AST: 19 U/L (ref 15–41)
Albumin: 3.8 g/dL (ref 3.5–5.0)
Alkaline Phosphatase: 93 U/L (ref 38–126)
Anion gap: 9 (ref 5–15)
BUN: 13 mg/dL (ref 8–23)
CO2: 28 mmol/L (ref 22–32)
Calcium: 9.3 mg/dL (ref 8.9–10.3)
Chloride: 104 mmol/L (ref 98–111)
Creatinine, Ser: 0.71 mg/dL (ref 0.44–1.00)
GFR, Estimated: >60 mL/min (ref >60)
Glucose, Bld: 102 mg/dL — ABNORMAL HIGH (ref 70–99)
Potassium: 3.7 mmol/L (ref 3.5–5.1)
Sodium: 141 mmol/L (ref 135–145)
Total Bilirubin: 0.4 mg/dL (ref 0.3–1.2)
Total Protein: 7.5 g/dL (ref 6.5–8.1)

## 2020-05-11 LAB — CBC WITH DIFFERENTIAL/PLATELET
Abs Immature Granulocytes: 0.1 10*3/uL — ABNORMAL HIGH (ref 0.00–0.07)
Basophils Absolute: 0.1 10*3/uL (ref 0.0–0.1)
Basophils Relative: 1 %
Eosinophils Absolute: 0.3 10*3/uL (ref 0.0–0.5)
Eosinophils Relative: 4 %
HCT: 37.9 % (ref 36.0–46.0)
Hemoglobin: 12.4 g/dL (ref 12.0–15.0)
Immature Granulocytes: 2 %
Lymphocytes Relative: 22 %
Lymphs Abs: 1.3 10*3/uL (ref 0.7–4.0)
MCH: 30.8 pg (ref 26.0–34.0)
MCHC: 32.7 g/dL (ref 30.0–36.0)
MCV: 94 fL (ref 80.0–100.0)
Monocytes Absolute: 0.5 10*3/uL (ref 0.1–1.0)
Monocytes Relative: 8 %
Neutro Abs: 3.8 10*3/uL (ref 1.7–7.7)
Neutrophils Relative %: 63 %
Platelets: 344 10*3/uL (ref 150–400)
RBC: 4.03 MIL/uL (ref 3.87–5.11)
RDW: 13 % (ref 11.5–15.5)
WBC: 6 10*3/uL (ref 4.0–10.5)
nRBC: 0 % (ref 0.0–0.2)

## 2020-05-11 MED ORDER — BENZONATATE 200 MG PO CAPS: ORAL_CAPSULE | ORAL | 0 refills | Status: DC

## 2020-05-11 MED ORDER — PREDNISONE 10 MG (21) PO TBPK: ORAL_TABLET | ORAL | 0 refills | Status: DC

## 2020-05-11 MED ORDER — AMOXICILLIN-POT CLAVULANATE 875-125 MG PO TABS: 1.0000 | ORAL_TABLET | Freq: Two times a day (BID) | ORAL | 0 refills | Status: DC

## 2020-05-11 MED ORDER — OTEZLA 30 MG PO TABS: 30.0000 mg | ORAL_TABLET | Freq: Every day | ORAL | 0 refills | Status: DC

## 2020-05-11 NOTE — Progress Notes (Signed)
Otezla RF to keep patient covered until follow up appointment.

## 2020-05-11 NOTE — Patient Instructions (Signed)
Upper Respiratory Infection, Adult An upper respiratory infection (URI) affects the nose, throat, and upper air passages. URIs are caused by germs (viruses). The most common type of URI is often called "the common cold." Medicines cannot cure URIs, but you can do things at home to relieve your symptoms. URIs usually get better within 7-10 days. Follow these instructions at home: Activity  Rest as needed.  If you have a fever, stay home from work or school until your fever is gone, or until your doctor says you may return to work or school. ? You should stay home until you cannot spread the infection anymore (you are not contagious). ? Your doctor may have you wear a face mask so you have less risk of spreading the infection. Relieving symptoms  Gargle with a salt-water mixture 3-4 times a day or as needed. To make a salt-water mixture, completely dissolve -1 tsp of salt in 1 cup of warm water.  Use a cool-mist humidifier to add moisture to the air. This can help you breathe more easily. Eating and drinking   Drink enough fluid to keep your pee (urine) pale yellow.  Eat soups and other clear broths. General instructions   Take over-the-counter and prescription medicines only as told by your doctor. These include cold medicines, fever reducers, and cough suppressants.  Do not use any products that contain nicotine or tobacco. These include cigarettes and e-cigarettes. If you need help quitting, ask your doctor.  Avoid being where people are smoking (avoid secondhand smoke).  Make sure you get regular shots and get the flu shot every year.  Keep all follow-up visits as told by your doctor. This is important. How to avoid spreading infection to others   Wash your hands often with soap and water. If you do not have soap and water, use hand sanitizer.  Avoid touching your mouth, face, eyes, or nose.  Cough or sneeze into a tissue or your sleeve or elbow. Do not cough or sneeze  into your hand or into the air. Contact a doctor if:  You are getting worse, not better.  You have any of these: ? A fever. ? Chills. ? Brown or red mucus in your nose. ? Yellow or brown fluid (discharge)coming from your nose. ? Pain in your face, especially when you bend forward. ? Swollen neck glands. ? Pain with swallowing. ? White areas in the back of your throat. Get help right away if:  You have shortness of breath that gets worse.  You have very bad or constant: ? Headache. ? Ear pain. ? Pain in your forehead, behind your eyes, and over your cheekbones (sinus pain). ? Chest pain.  You have long-lasting (chronic) lung disease along with any of these: ? Wheezing. ? Long-lasting cough. ? Coughing up blood. ? A change in your usual mucus.  You have a stiff neck.  You have changes in your: ? Vision. ? Hearing. ? Thinking. ? Mood. Summary  An upper respiratory infection (URI) is caused by a germ called a virus. The most common type of URI is often called "the common cold."  URIs usually get better within 7-10 days.  Take over-the-counter and prescription medicines only as told by your doctor. This information is not intended to replace advice given to you by your health care provider. Make sure you discuss any questions you have with your health care provider. Document Revised: 05/02/2018 Document Reviewed: 12/15/2016 Elsevier Patient Education  2020 Elsevier Inc. Cough, Adult A cough  helps to clear your throat and lungs. A cough may be a sign of an illness or another medical condition. An acute cough may only last 2-3 weeks, while a chronic cough may last 8 or more weeks. Many things can cause a cough. They include:  Germs (viruses or bacteria) that attack the airway.  Breathing in things that bother (irritate) your lungs.  Allergies.  Asthma.  Mucus that runs down the back of your throat (postnasal drip).  Smoking.  Acid backing up from the stomach  into the tube that moves food from the mouth to the stomach (gastroesophageal reflux).  Some medicines.  Lung problems.  Other medical conditions, such as heart failure or a blood clot in the lung (pulmonary embolism). Follow these instructions at home: Medicines  Take over-the-counter and prescription medicines only as told by your doctor.  Talk with your doctor before you take medicines that stop a cough (coughsuppressants). Lifestyle   Do not smoke, and try not to be around smoke. Do not use any products that contain nicotine or tobacco, such as cigarettes, e-cigarettes, and chewing tobacco. If you need help quitting, ask your doctor.  Drink enough fluid to keep your pee (urine) pale yellow.  Avoid caffeine.  Do not drink alcohol if your doctor tells you not to drink. General instructions   Watch for any changes in your cough. Tell your doctor about them.  Always cover your mouth when you cough.  Stay away from things that make you cough, such as perfume, candles, campfire smoke, or cleaning products.  If the air is dry, use a cool mist vaporizer or humidifier in your home.  If your cough is worse at night, try using extra pillows to raise your head up higher while you sleep.  Rest as needed.  Keep all follow-up visits as told by your doctor. This is important. Contact a doctor if:  You have new symptoms.  You cough up pus.  Your cough does not get better after 2-3 weeks, or your cough gets worse.  Cough medicine does not help your cough and you are not sleeping well.  You have pain that gets worse or pain that is not helped with medicine.  You have a fever.  You are losing weight and you do not know why.  You have night sweats. Get help right away if:  You cough up blood.  You have trouble breathing.  Your heartbeat is very fast. These symptoms may be an emergency. Do not wait to see if the symptoms will go away. Get medical help right away. Call  your local emergency services (911 in the [place] Do not drive yourself to the hospital. Summary  A cough helps to clear your throat and lungs. Many things can cause a cough.  Take over-the-counter and prescription medicines only as told by your doctor.  Always cover your mouth when you cough.  Contact a doctor if you have new symptoms or you have a cough that does not get better or gets worse. This information is not intended to replace advice given to you by your health care provider. Make sure you discuss any questions you have with your health care provider. Document Revised: 05/13/2018 Document Reviewed: 05/13/2018 Elsevier Patient Education  [place].

## 2020-05-11 NOTE — Progress Notes (Signed)
Virtual telephone visit    Virtual Visit via Telephone Note   This visit type was conducted due to national recommendations for restrictions regarding the COVID-19 Pandemic (e.g. social distancing) in an effort to limit this patient's exposure and mitigate transmission in our community. Due to her co-morbid illnesses, this patient is at least at moderate risk for complications without adequate follow up. This format is felt to be most appropriate for this patient at this time. The patient did not have access to video technology or had technical difficulties with video requiring transitioning to audio format only (telephone). Physical exam was limited to content and character of the telephone converstion.   Parties involved in visit as below:   Patient location: at home Provider location: Provider: Provider's office at  [facility], [place].     I discussed the limitations of evaluation and management by telemedicine and the availability of in person appointments. The patient expressed understanding and agreed to proceed.   Visit Date: 05/11/2020  Today's healthcare provider: Jairo Ben, FNP   Chief Complaint  Patient presents with  . Cough   Subjective    HPI  Patient with c/o cough worsening. Reports that today she is feeling better but yesterday it was worse.This has been going on for a week. She is also having some post nasal drip and feel some bumps in the back of her throat. She has been using the inhaler taking Tessalon Perles.    She has been using Albuterol and tessalon with some relief.  She has increased chest congestion, clear congestion.  covid test last week was negative.  She has increased post nasal drip.  Has increasing fatigue, she went out for the first timie yesterday and " was very tired when she returned home".   Patient  denies any fever, body aches,chills, rash, chest pain, shortness of breath, nausea, vomiting, or  diarrhea.  Synthroid     Medications: Outpatient Medications Prior to Visit  Medication Sig  . albuterol (VENTOLIN HFA) 108 (90 Base) MCG/ACT inhaler Inhale 2 puffs into the lungs every 6 (six) hours as needed for wheezing or shortness of breath.  . ALPRAZolam (XANAX) 0.5 MG tablet TAKE 1 TABLET BY MOUTH EVERY DAY AND 1 TABLET AT BEDTIME  . amLODipine (NORVASC) 5 MG tablet TAKE 1 TABLET BY MOUTH AT BEDTIME  . Apremilast (OTEZLA) 30 MG TABS Take 1 tablet (30 mg total) by mouth daily.  Marland Kitchen azelastine (ASTELIN) 0.1 % nasal spray PLACE 1 SPRAY INTO THE NOSE 2 (TWO) TIMES DAILY.  . B COMPLEX VITAMINS PO Take 1 tablet by mouth daily.  . benzonatate (TESSALON) 200 MG capsule Take 1 capsule (200 mg total) by mouth 3 (three) times daily as needed.  . BRYHALI 0.01 % LOTN Apply to aa's psoriasis BID x 2 wks. Then decrease use to BID 5d/wk. Avoid f/g/a.  . cetirizine (ZYRTEC) 10 MG tablet Take 10 mg by mouth See admin instructions. Take 10 mg by mouth every other night  . Cholecalciferol (VITAMIN D3) 1000 UNITS CAPS Take 1,000 Units by mouth at bedtime.   . diphenhydrAMINE (BENADRYL) 25 mg capsule Take 25 mg by mouth at bedtime.  Marland Kitchen ibuprofen (ADVIL) 600 MG tablet TAKE 1 TABLET BY MOUTH EVERY 6 HOURS AS NEEDED  . levothyroxine (SYNTHROID) 25 MCG tablet TAKE 1 TABLET (25 MCG TOTAL) BY MOUTH DAILY BEFORE BREAKFAST.  Marland Kitchen nystatin cream (MYCOSTATIN) APPLY TO AFFECTED AREA TWICE A DAY  . pantoprazole (PROTONIX) 40 MG tablet  TAKE 1 TABLET BY MOUTH EVERY DAY  . venlafaxine (EFFEXOR) 37.5 MG tablet Take by mouth.  . fluconazole (DIFLUCAN) 200 MG tablet TAKE 1 TABLET (200 MG TOTAL) BY MOUTH AS DIRECTED. TAKE 1 TABLET BY MOUTH. REPEAT IF NEEDED. (Patient not taking: Reported on 05/11/2020)  . fluticasone (FLONASE) 50 MCG/ACT nasal spray SPRAY 2 SPRAYS INTO EACH NOSTRIL EVERY DAY (Patient not taking: Reported on 05/11/2020)   No facility-administered medications prior to visit.    Review of Systems  Constitutional:  Positive for fatigue. Negative for activity change, appetite change, chills, diaphoresis, fever and unexpected weight change.  HENT: Positive for congestion, sinus pressure and sore throat. Negative for sneezing, trouble swallowing and voice change.   Respiratory: Positive for cough, chest tightness and wheezing. Negative for apnea, choking, shortness of breath and stridor.   Cardiovascular: Negative.   Genitourinary: Negative.   Musculoskeletal: Negative.   Skin: Negative.   Hematological: Negative.   Psychiatric/Behavioral: Negative.       Objective    There were no vitals taken for this visit.  No vitals avalible  Denies any fever.    Patient is alert and oriented and responsive to questions Engages in conversation with provider. Speaks in full sentences without any pauses without any shortness of breath or distress.    Assessment & Plan     Viral upper respiratory infection - Plan: DG Chest 2 View, CBC with Differential/Platelet, Comprehensive metabolic panel, SARS-CoV-2 Antibody, IgM  Cough - Plan: DG Chest 2 View, CBC with Differential/Platelet, Comprehensive metabolic panel, SARS-CoV-2 Antibody, IgM   Orders Placed This Encounter  Procedures  . DG Chest 2 View  . CBC with Differential/Platelet  . Comprehensive metabolic panel  . SARS-CoV-2 Antibody, IgM   Will order the above to rule out further infection.   Red Flags discussed. The patient was given clear instructions to go to ER or return to medical center if any red flags develop, symptoms do not improve, worsen or new problems develop. They verbalized understanding.   Return in about 3 days (around 05/14/2020), or if symptoms worsen or fail to improve, for at any time for any worsening symptoms.    I discussed the assessment and treatment plan with the patient. The patient was provided an opportunity to ask questions and all were answered. The patient agreed with the plan and demonstrated an understanding of the  instructions.   The patient was advised to call back or seek an in-person evaluation if the symptoms worsen or if the condition fails to improve as anticipated.  I provided 22 minutes of non-face-to-face time during this encounter.  IBeverely Pace Flinchum, FNP, have reviewed all documentation for this visit. The documentation on 05/11/20 for the exam, diagnosis, procedures, and orders are all accurate and complete.  The entirety of the information documented in the History of Present Illness, Review of Systems and Physical Exam were personally obtained by me. Portions of this information were initially documented by the CMA and reviewed by me for thoroughness and accuracy.     Jairo Ben, FNP [facility] [phone] (phone) [phone] (fax)  [facility]

## 2020-05-11 NOTE — Progress Notes (Signed)
Patient was called by provider within normal chest x ray

## 2020-05-11 NOTE — Progress Notes (Signed)
Cbc ok , mild increase in immature granulocytes, CMP ok glucose not fasting.

## 2020-05-14 ENCOUNTER — Other Ambulatory Visit: Payer: Self-pay | Admitting: Physician Assistant

## 2020-05-14 DIAGNOSIS — F419 Anxiety disorder, unspecified: Secondary | ICD-10-CM

## 2020-05-14 NOTE — Telephone Encounter (Signed)
Requested medication (s) are due for refill today: yes  Requested medication (s) are on the active medication list: yes  Last refill:  04/10/2020  Future visit scheduled:yes  Notes to clinic:  this refill cannot be delegated    Requested Prescriptions  Pending Prescriptions Disp Refills   ALPRAZolam (XANAX) 0.5 MG tablet [Pharmacy Med Name: ALPRAZOLAM 0.5 MG TABLET] 60 tablet     Sig: TAKE 1 TABLET BY MOUTH EVERY DAY AND 1 TABLET AT BEDTIME      Not Delegated - Psychiatry:  Anxiolytics/Hypnotics Failed - 05/14/2020  2:30 PM      Failed - This refill cannot be delegated      Failed - Urine Drug Screen completed in last 360 days      Passed - Valid encounter within last 6 months    Recent Outpatient Visits           3 days ago Viral upper respiratory infection   [facility] Flinchum, Kelby Aline, FNP   5 months ago Right foot pain   [facility] Fenton Malling M, [place]   9 months ago Annual physical exam   [place], [place]   10 months ago Acute cystitis without hematuria   [facility] [place], Clearnce Sorrel, [place]   1 year ago [place], Clearnce Sorrel, [place]       Future Appointments             In 1 month Ralene Bathe, MD [place]

## 2020-05-20 ENCOUNTER — Other Ambulatory Visit: Payer: Self-pay | Admitting: Physician Assistant

## 2020-05-20 DIAGNOSIS — J301 Allergic rhinitis due to pollen: Secondary | ICD-10-CM

## 2020-05-23 ENCOUNTER — Other Ambulatory Visit: Payer: Self-pay | Admitting: Physician Assistant

## 2020-05-24 ENCOUNTER — Telehealth: Payer: Self-pay | Admitting: Physician Assistant

## 2020-05-24 MED ORDER — FLUCONAZOLE 200 MG PO TABS: 200.0000 mg | ORAL_TABLET | ORAL | 1 refills | Status: DC

## 2020-05-24 NOTE — Telephone Encounter (Signed)
Pt had video visit with michelle on 05-11-2020 and was given abx and prednisone. Pt has yeast infection. Pt is having itching and burning.  Pt would like diflucan sent her pharm [facility] [address] in [place]

## 2020-05-24 NOTE — Telephone Encounter (Signed)
Diflucan sent in

## 2020-05-25 ENCOUNTER — Telehealth: Payer: Self-pay | Admitting: Physician Assistant

## 2020-05-25 NOTE — Telephone Encounter (Signed)
Please advise if appropriate to refill? KW

## 2020-05-25 NOTE — Telephone Encounter (Signed)
Please advise 

## 2020-05-25 NOTE — Telephone Encounter (Signed)
Not appropriate to refill antibiotics, will need office visit sooner if cough worsening or be seen in person UC/ respiratory clinic since has had virtual visit.

## 2020-05-25 NOTE — Telephone Encounter (Signed)
Pt does have virtual appt with michelle on 05-27-2020 and had virtual appt o 05-11-2020 with michelle. Pt cough has returned and she would like to know if she could get a refill on abx . [facility] [place] [address].

## 2020-05-26 ENCOUNTER — Encounter: Payer: Self-pay | Admitting: Physician Assistant

## 2020-05-26 ENCOUNTER — Telehealth: Payer: 59 | Admitting: Adult Health

## 2020-05-26 ENCOUNTER — Telehealth (INDEPENDENT_AMBULATORY_CARE_PROVIDER_SITE_OTHER): Payer: 59 | Admitting: Physician Assistant

## 2020-05-26 DIAGNOSIS — J0140 Acute pansinusitis, unspecified: Secondary | ICD-10-CM

## 2020-05-26 DIAGNOSIS — R059 Cough, unspecified: Secondary | ICD-10-CM

## 2020-05-26 MED ORDER — PREDNISONE 20 MG PO TABS: 20.0000 mg | ORAL_TABLET | Freq: Every day | ORAL | 0 refills | Status: DC

## 2020-05-26 MED ORDER — BENZONATATE 200 MG PO CAPS: ORAL_CAPSULE | ORAL | 0 refills | Status: DC

## 2020-05-26 MED ORDER — AZITHROMYCIN 250 MG PO TABS: ORAL_TABLET | ORAL | 0 refills | Status: DC
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
## 2020-05-26 NOTE — Patient Instructions (Signed)

## 2020-05-27 ENCOUNTER — Telehealth: Payer: 59 | Admitting: Adult Health

## 2020-05-27 NOTE — Telephone Encounter (Signed)
Patien was seen by PA Fenton Malling. KW

## 2020-05-31 ENCOUNTER — Other Ambulatory Visit: Payer: Self-pay | Admitting: Physician Assistant

## 2020-05-31 DIAGNOSIS — R1310 Dysphagia, unspecified: Secondary | ICD-10-CM

## 2020-06-12 ENCOUNTER — Other Ambulatory Visit: Payer: Self-pay | Admitting: Dermatology

## 2020-06-12 DIAGNOSIS — L409 Psoriasis, unspecified: Secondary | ICD-10-CM

## 2020-06-14 ENCOUNTER — Ambulatory Visit: Payer: 59 | Admitting: Dermatology

## 2020-06-14 ENCOUNTER — Other Ambulatory Visit: Payer: Self-pay

## 2020-06-14 ENCOUNTER — Other Ambulatory Visit: Payer: Self-pay | Admitting: Dermatology

## 2020-06-14 DIAGNOSIS — L03319 Cellulitis of trunk, unspecified: Secondary | ICD-10-CM | POA: Diagnosis not present

## 2020-06-14 DIAGNOSIS — L304 Erythema intertrigo: Secondary | ICD-10-CM | POA: Diagnosis not present

## 2020-06-14 DIAGNOSIS — L820 Inflamed seborrheic keratosis: Secondary | ICD-10-CM | POA: Diagnosis not present

## 2020-06-14 DIAGNOSIS — L570 Actinic keratosis: Secondary | ICD-10-CM

## 2020-06-14 DIAGNOSIS — L0291 Cutaneous abscess, unspecified: Secondary | ICD-10-CM

## 2020-06-14 DIAGNOSIS — L408 Other psoriasis: Secondary | ICD-10-CM

## 2020-06-14 DIAGNOSIS — D485 Neoplasm of uncertain behavior of skin: Secondary | ICD-10-CM

## 2020-06-14 DIAGNOSIS — L02219 Cutaneous abscess of trunk, unspecified: Secondary | ICD-10-CM

## 2020-06-14 DIAGNOSIS — L400 Psoriasis vulgaris: Secondary | ICD-10-CM

## 2020-06-14 HISTORY — DX: Actinic keratosis: L57.0

## 2020-06-14 MED ORDER — DOXYCYCLINE HYCLATE 100 MG PO TABS: ORAL_TABLET | ORAL | 0 refills | Status: DC

## 2020-06-14 MED ORDER — MUPIROCIN 2 % EX OINT: TOPICAL_OINTMENT | CUTANEOUS | 1 refills | Status: DC

## 2020-06-14 MED ORDER — KETOCONAZOLE 2 % EX CREA: TOPICAL_CREAM | CUTANEOUS | 3 refills | Status: DC

## 2020-06-14 MED ORDER — FLUCONAZOLE 200 MG PO TABS: ORAL_TABLET | ORAL | 0 refills | Status: DC

## 2020-06-14 MED ORDER — ENSTILAR 0.005-0.064 % EX FOAM: CUTANEOUS | 3 refills | Status: DC

## 2020-06-14 NOTE — Patient Instructions (Signed)

## 2020-06-14 NOTE — Progress Notes (Unsigned)
Follow-Up Visit   Subjective  Gina [name] is a 65 y.o. female who presents for the following: Psoriasis (Patient has been out [place] for about a month now and has been experiencing flares on her back, chest, and legs. She would like to restart treatment with the [facility], but states that the Bryhali lotion is expensive. ), Rash (Inframammary - was prescribed Nystatin cream by her PCP and states that it has helped some, but the rash has not completely resolved), and skin lesion (On the right lower leg - new, will not resolve, bleeds, irregular. Patient is concerned and would like lesion checked). She also has a new irritated skin lesion on the L shoulder that she would like checked.  The following portions of the chart were reviewed this encounter and updated as appropriate:   Tobacco  Allergies  Meds  Problems  Med Hx  Surg Hx  Fam Hx     Review of Systems:  No other skin or systemic complaints except as noted in HPI or Assessment and Plan.  Objective  Well appearing patient in no apparent distress; mood and affect are within normal limits.  A focused examination was performed including the trunk and extremities. Relevant physical exam findings are noted in the Assessment and Plan.  Objective  Back, inframammary: Well-demarcated erythematous papules/plaques with silvery scale.   Images      Objective  Inframammary: Erythema  Images    Objective  R distal lat pretibial: 1.2 cm hyperkeratotic papule   Objective  L shoulder: Erythematous keratotic or waxy stuck-on papule or plaque.   Assessment & Plan  Plaque psoriasis Back, inframammary With psoriasis inversa of the inframammary complicated by intertrigo -  Psoriasis is a chronic non-curable, but treatable genetic/hereditary disease that may have other systemic features affecting other organ systems such as joints (Psoriatic Arthritis). It is associated with an increased risk of inflammatory bowel disease,  heart disease, non-alcoholic fatty liver disease, and depression.    Start Enstilar foam apply to aa's BID 5 days week PRN.  Continue Otezla 30mg  po QD (BID caused GI upset).   Calcipotriene-Betameth Diprop (ENSTILAR) 0.005-0.064 % FOAM - Back, inframammary  Erythema intertrigo Inframammary Severe - with Psoriasis inversa Intertrigo is a chronic recurrent rash that occurs in skin fold areas that may be associated with friction; heat; moisture; yeast; fungus; and bacteria.  It is exacerbated by increased movement / activity; sweating; and higher atmospheric temperature.  Start Diflucan 200mg  po QD on M, W, F x 2 weeks # 6 1RF.  Start Ketoconazole 2% cream QD.   fluconazole (DIFLUCAN) 200 MG tablet - Inframammary  ketoconazole (NIZORAL) 2 % cream - Inframammary  Abscess Right Inframammary Fold Cellulitis, resolving - hx of rupture and drainage Start Doxycycline 100mg  po BID x 7. Take with food. Doxycycline should be taken with food to prevent nausea. Do not lay down for 30 minutes after taking. Be cautious with sun exposure and use good sun protection while on this medication. Pregnant women should not take this medication.    Start Mupirocin 2% ointment to aa QD.   mupirocin ointment (BACTROBAN) 2 % - Right Inframammary Fold  doxycycline (VIBRA-TABS) 100 MG tablet - Right Inframammary Fold  Neoplasm of uncertain behavior of skin R distal lat pretibial Epidermal / dermal shaving  Lesion diameter (cm):  1.2 Informed consent: discussed and consent obtained   Timeout: patient name, date of birth, surgical site, and procedure verified   Procedure prep:  Patient was prepped and draped  in usual sterile fashion Prep type:  Isopropyl alcohol Anesthesia: the lesion was anesthetized in a standard fashion   Anesthetic:  1% lidocaine w/ epinephrine 1-100,000 buffered w/ 8.4% NaHCO3 Instrument used: flexible razor blade   Hemostasis achieved with: pressure, aluminum chloride and  electrodesiccation   Outcome: patient tolerated procedure well   Post-procedure details: sterile dressing applied and wound care instructions given   Dressing type: bandage and petrolatum    Destruction of lesion Complexity: extensive   Destruction method: electrodesiccation and curettage   Informed consent: discussed and consent obtained   Timeout:  patient name, date of birth, surgical site, and procedure verified Procedure prep:  Patient was prepped and draped in usual sterile fashion Prep type:  Isopropyl alcohol Anesthesia: the lesion was anesthetized in a standard fashion   Anesthetic:  1% lidocaine w/ epinephrine 1-100,000 buffered w/ 8.4% NaHCO3 Curettage performed in three different directions: Yes   Electrodesiccation performed over the curetted area: Yes   Lesion length (cm):  1.2 Lesion width (cm):  1.2 Margin per side (cm):  0.2 Final wound size (cm):  1.6 Hemostasis achieved with:  pressure, aluminum chloride and electrodesiccation Outcome: patient tolerated procedure well with no complications   Post-procedure details: sterile dressing applied and wound care instructions given   Dressing type: bandage and petrolatum    Specimen 1 - Surgical pathology Differential Diagnosis: D48.5 r/o SCC vs other  ED&C today  Check Margins: No 1.2 cm hyperkeratotic papule  Inflamed seborrheic keratosis L shoulder Destruction of lesion - L shoulder Complexity: simple   Destruction method: cryotherapy   Informed consent: discussed and consent obtained   Timeout:  patient name, date of birth, surgical site, and procedure verified Lesion destroyed using liquid nitrogen: Yes   Region frozen until ice ball extended beyond lesion: Yes   Outcome: patient tolerated procedure well with no complications   Post-procedure details: wound care instructions given    Return in about 4 weeks (around 07/12/2020) for psoriasis, bx, ISK, and abscess follow up .  Luther Redo, CMA, am acting as  scribe for Sarina Ser, MD .  Documentation: I have reviewed the above documentation for accuracy and completeness, and I agree with the above.  Sarina Ser, MD

## 2020-06-15 ENCOUNTER — Telehealth: Payer: Self-pay

## 2020-06-15 ENCOUNTER — Other Ambulatory Visit: Payer: Self-pay | Admitting: Physician Assistant

## 2020-06-15 ENCOUNTER — Encounter: Payer: Self-pay | Admitting: Dermatology

## 2020-06-15 NOTE — Telephone Encounter (Signed)
Patient's Enstilar not covered. She states she has Bryhalli at home that she has used in the past for psoriasis. Patient advised this possibly would be non covered too but we could use [employer]. Please advise alternative. Thank you!

## 2020-06-15 NOTE — Telephone Encounter (Signed)
Patient informed of pathology results 

## 2020-06-15 NOTE — Telephone Encounter (Signed)
-----   Message from Ralene Bathe, MD sent at 06/15/2020 11:34 AM EST ----- Diagnosis Skin , right distal lat pretibial HYPERTROPHIC ACTINIC KERATOSIS  PreCancer Already treated Recheck next visit

## 2020-06-15 NOTE — Telephone Encounter (Signed)
May use Bryhali qd to aa psoriasis up to 5 days per week prn (not to face; groin or under arms) - send in if pt needs with 4 rf And Start Calcipotriene cream or foam qd 7 days a week prn aa psoriasis disp trade with 6 rf

## 2020-06-16 ENCOUNTER — Telehealth: Payer: Self-pay

## 2020-06-16 MED ORDER — BRYHALI 0.01 % EX LOTN: 1.0000 "application " | TOPICAL_LOTION | CUTANEOUS | 3 refills | Status: DC

## 2020-06-16 MED ORDER — CALCIPOTRIENE 0.005 % EX CREA: TOPICAL_CREAM | Freq: Every day | CUTANEOUS | 0 refills | Status: DC

## 2020-06-16 NOTE — Telephone Encounter (Signed)
Since she is tapering off brain medication, then wait to increase Otezla until off brain medication for at least 2 weeks. If doing fine after 2 weeks off brain medication, may try to increase Otezla to twice a day. [place] Nail can cause headaches as a side effect).

## 2020-06-16 NOTE — Telephone Encounter (Signed)
Prescriptions sent to [facility] and patient advised.

## 2020-06-16 NOTE — Telephone Encounter (Signed)
Called pt discussed biopsy results,   pt was told to increase Otezla to bid, pt would like to know if she should increase [place] while she is tapering off her brain medication she is concerned she might start getting side effects and she may not know if it is related to increased Otezla or taper dose of brain medication.

## 2020-06-16 NOTE — Telephone Encounter (Signed)
-----   Message from Ralene Bathe, MD sent at 06/16/2020 12:51 PM EST ----- Diagnosis Skin , right distal lat pretibial HYPERTROPHIC ACTINIC KERATOSIS  PreCancer  Already treated Recheck next visit

## 2020-06-17 NOTE — Telephone Encounter (Signed)
Left message for patient to return my call.

## 2020-06-17 NOTE — Telephone Encounter (Signed)
Patient advised of information per Dr. Kowalski.  

## 2020-06-20 ENCOUNTER — Other Ambulatory Visit: Payer: Self-pay | Admitting: Physician Assistant

## 2020-06-20 DIAGNOSIS — I10 Essential (primary) hypertension: Secondary | ICD-10-CM

## 2020-06-28 ENCOUNTER — Other Ambulatory Visit: Payer: Self-pay | Admitting: Dermatology

## 2020-06-28 DIAGNOSIS — L304 Erythema intertrigo: Secondary | ICD-10-CM

## 2020-06-29 ENCOUNTER — Other Ambulatory Visit: Payer: Self-pay | Admitting: Physician Assistant

## 2020-06-29 DIAGNOSIS — E034 Atrophy of thyroid (acquired): Secondary | ICD-10-CM

## 2020-06-29 IMAGING — MG DIGITAL SCREENING BILATERAL MAMMOGRAM WITH TOMO AND CAD
6 of 10 series · 6 of 30 positions shown · non-contrast
Comparison: Previous exam(s).

CLINICAL DATA: Screening.

EXAM:
DIGITAL SCREENING BILATERAL MAMMOGRAM WITH TOMO AND CAD

[R CC synth-2D]
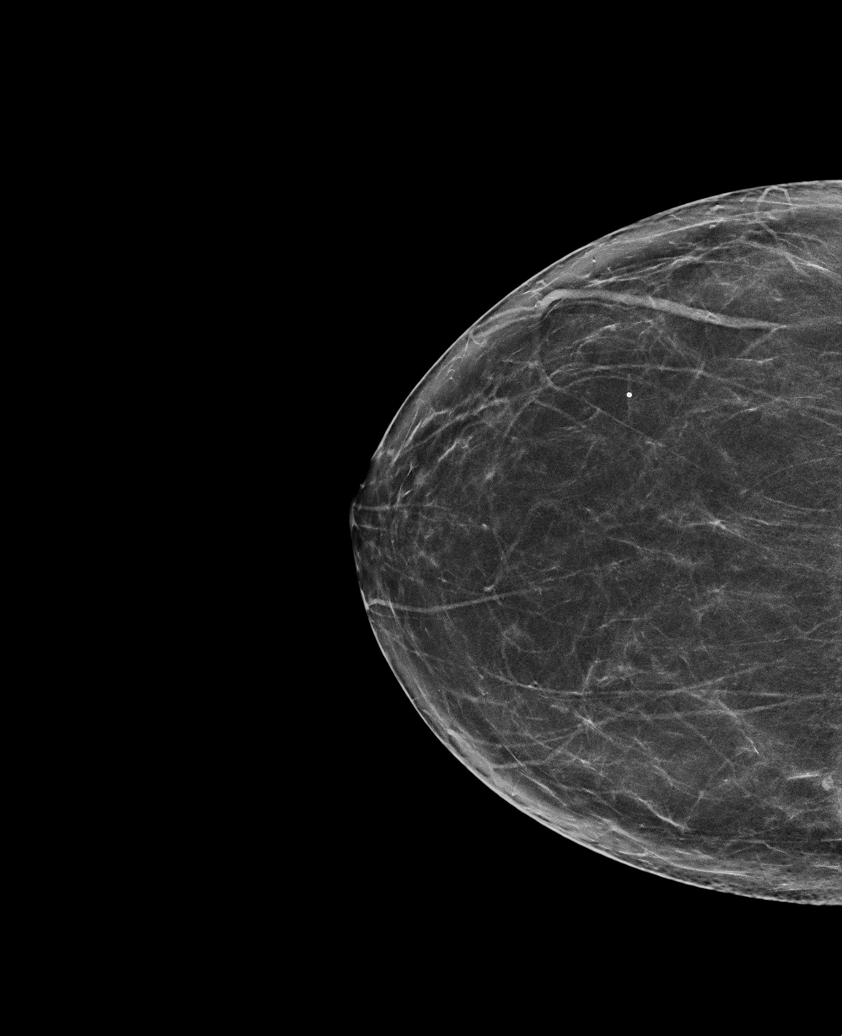

[L CC synth-2D]
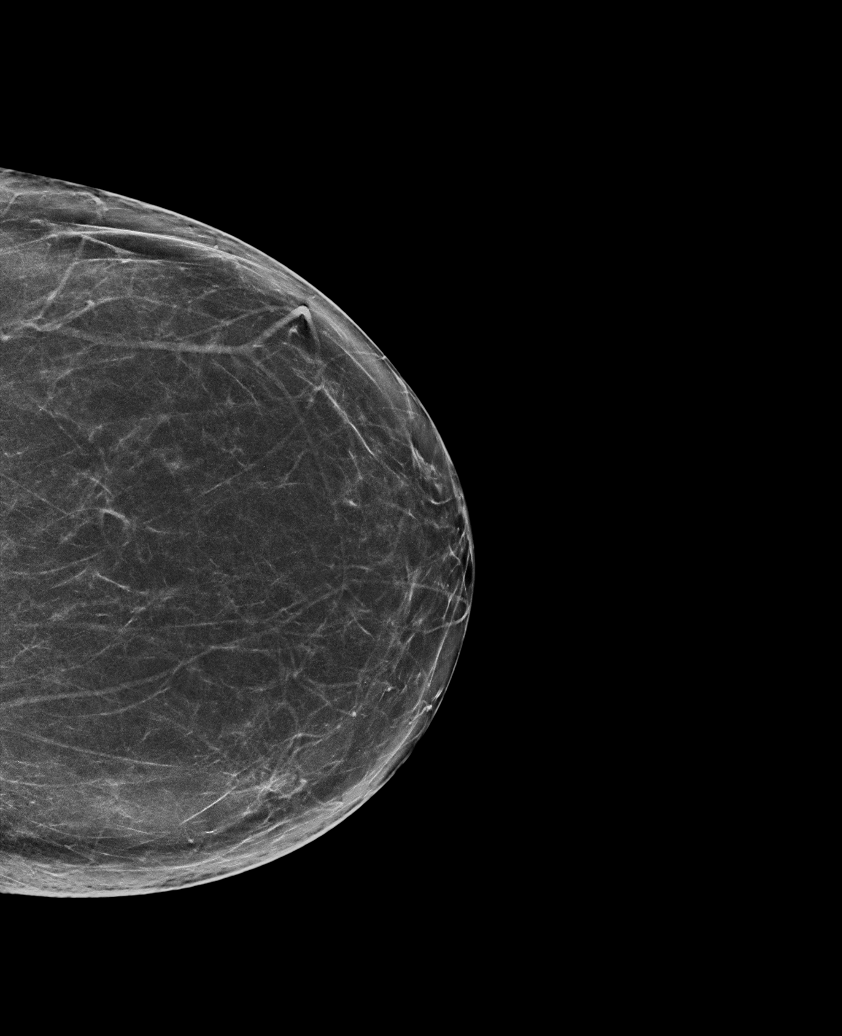

[L MLO synth-2D (1 of 2)]
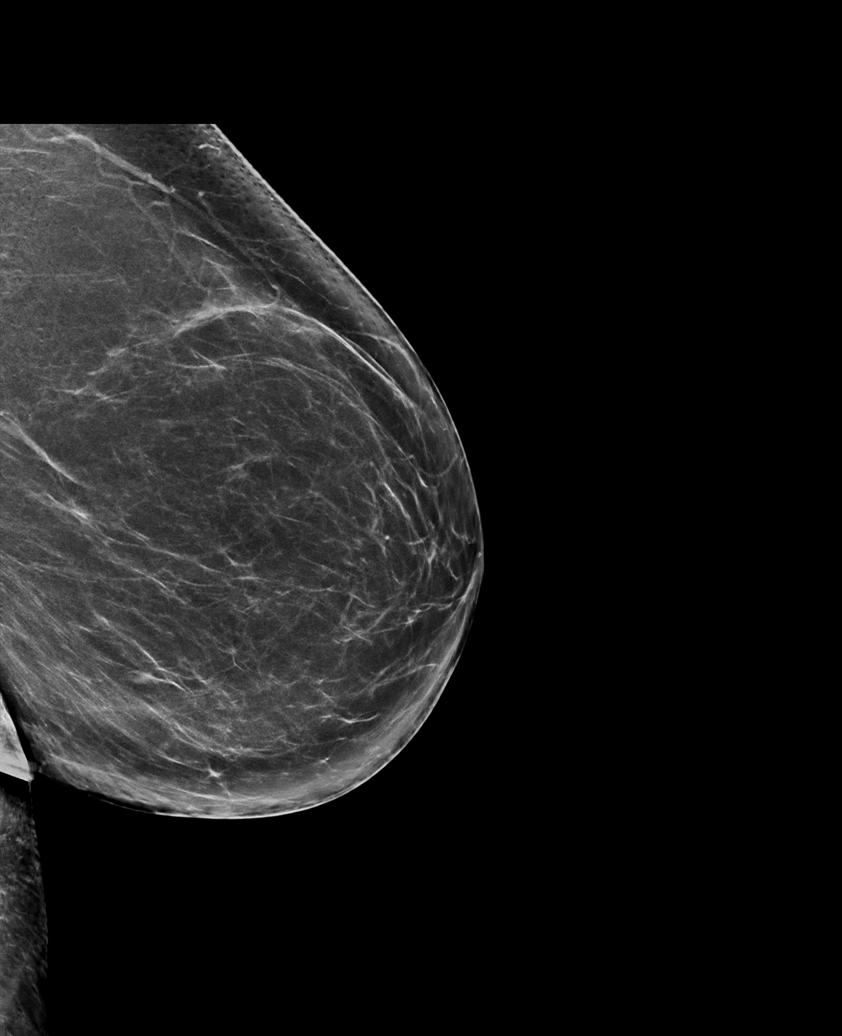

[L MLO synth-2D (2 of 2)]
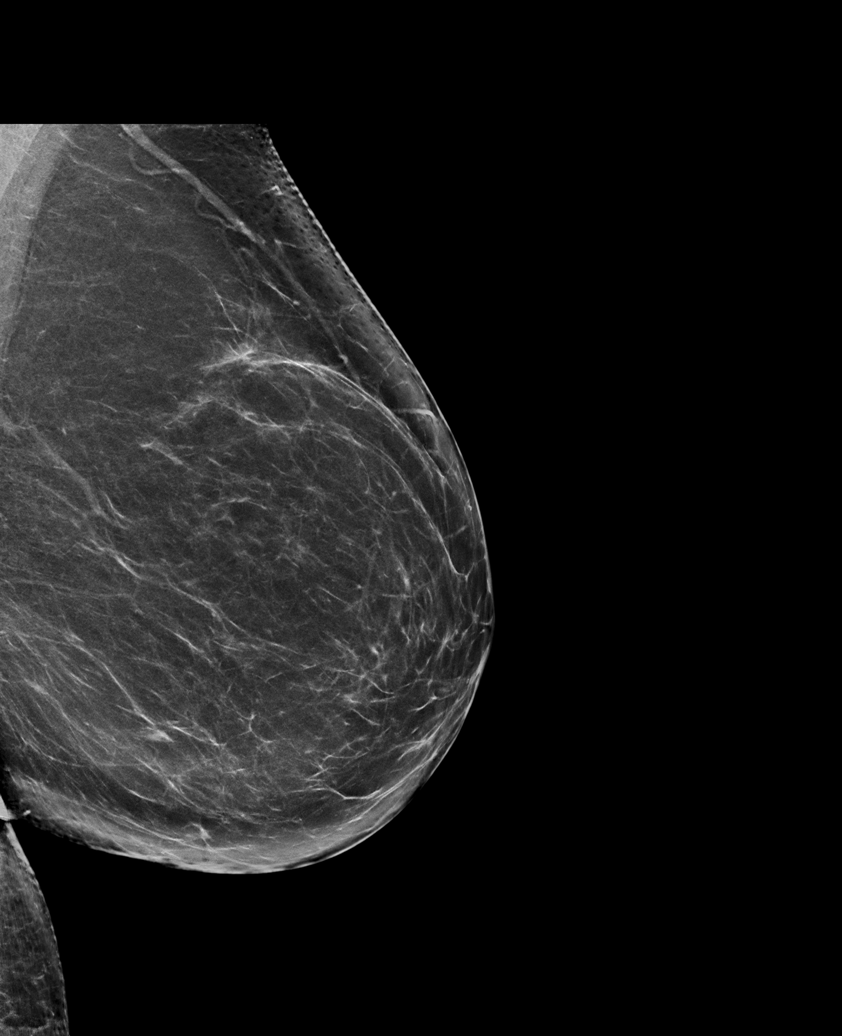

[R MLO synth-2D]
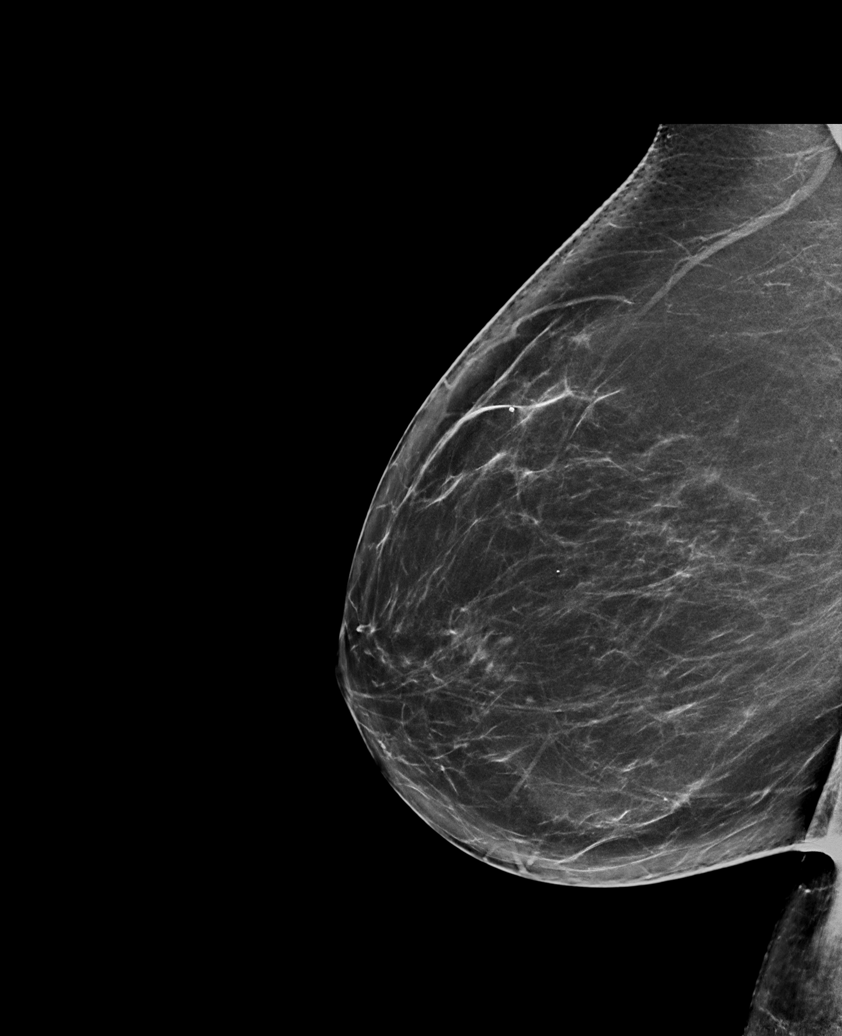

[R MLO tomo · tomo slice 40/79.0]
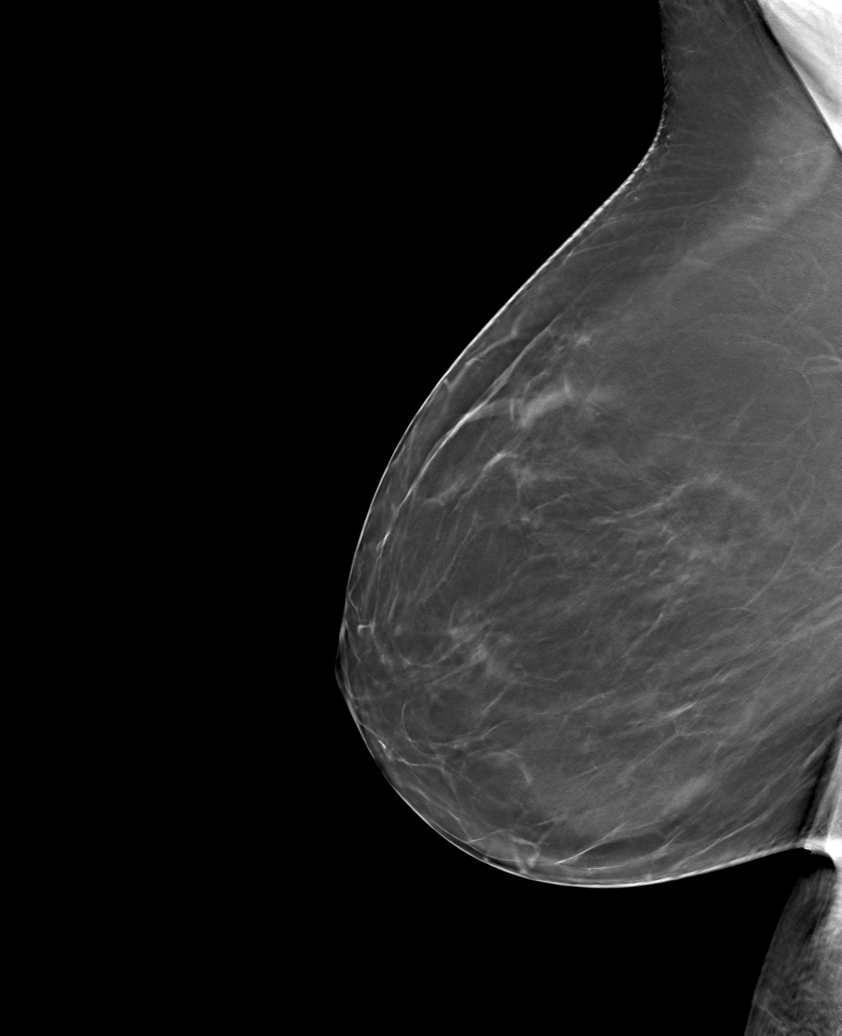

[6 of 30 positions shown; findings below may reference images not displayed]

ACR Breast Density Category b: There are scattered areas of
fibroglandular density.
FINDINGS: There are no findings suspicious for malignancy. Images were
processed with CAD.
IMPRESSION: No mammographic evidence of malignancy. A result letter of this
screening mammogram will be mailed directly to the patient.

RECOMMENDATION:
Screening mammogram in one year. (Code:[id number])

BI-RADS CATEGORY  1: Negative.

## 2020-07-11 ENCOUNTER — Other Ambulatory Visit: Payer: Self-pay | Admitting: Physician Assistant

## 2020-07-11 DIAGNOSIS — I10 Essential (primary) hypertension: Secondary | ICD-10-CM

## 2020-07-12 ENCOUNTER — Ambulatory Visit: Payer: 59 | Admitting: Dermatology

## 2020-07-12 ENCOUNTER — Other Ambulatory Visit: Payer: Self-pay

## 2020-07-12 DIAGNOSIS — Z872 Personal history of diseases of the skin and subcutaneous tissue: Secondary | ICD-10-CM | POA: Diagnosis not present

## 2020-07-12 DIAGNOSIS — L408 Other psoriasis: Secondary | ICD-10-CM

## 2020-07-12 DIAGNOSIS — L409 Psoriasis, unspecified: Secondary | ICD-10-CM

## 2020-07-12 DIAGNOSIS — L304 Erythema intertrigo: Secondary | ICD-10-CM | POA: Diagnosis not present

## 2020-07-12 NOTE — Progress Notes (Signed)
   Follow-Up Visit   Subjective  Gina [name] is a 65 y.o. female who presents for the following: Psoriasis (1 month follow up - taking Otezla 30 mg 1 po qd. No headaches, no GI upset. No depression. Psoriasis is better with much less itching.).  The following portions of the chart were reviewed this encounter and updated as appropriate:   Tobacco  Allergies  Meds  Problems  Med Hx  Surg Hx  Fam Hx     Review of Systems:  No other skin or systemic complaints except as noted in HPI or Assessment and Plan.  Objective  Well appearing patient in no apparent distress; mood and affect are within normal limits.  A focused examination was performed including arms, chest, back, legs. Relevant physical exam findings are noted in the Assessment and Plan.  Objective  Chest - Medial [facility]): Pinkness of low back  Objective  Right Lower Leg - Anterior: Healing EDC site. Clear today   Assessment & Plan  Psoriasis - severe but improved on systemic oral Otezla.  Decreased itch Chest - Medial [facility]) Psoriasis is a chronic non-curable, but treatable genetic/hereditary disease that may have other systemic features affecting other organ systems such as joints (Psoriatic Arthritis). It is associated with an increased risk of inflammatory bowel disease, heart disease, non-alcoholic fatty liver disease, and depression.    Continue Otezla 30 mg 1 po.  No side effects.  Other Related Medications Apremilast (OTEZLA) 30 MG TABS  Erythema intertrigo Left Abdomen (side) - Upper With element of Psoriasis Inversa  Intertrigo is a chronic recurrent rash that occurs in skin fold areas that may be associated with friction; heat; moisture; yeast; fungus; and bacteria.  It is exacerbated by increased movement / activity; sweating; and higher atmospheric temperature.  Continue Ketoconazole 2% cream qd, Bryhali lotion qd  Other Related Medications ketoconazole (NIZORAL) 2 % cream  History of  actinic keratoses Right Lower Leg - Anterior Healing. Continue Mupirocin oint until healed  Return in about 6 months (around 01/12/2021) for Psoriasis.  I, Ashok Cordia, CMA, am acting as scribe for Sarina Ser, MD .  Documentation: I have reviewed the above documentation for accuracy and completeness, and I agree with the above.  Sarina Ser, MD

## 2020-07-13 ENCOUNTER — Encounter: Payer: Self-pay | Admitting: Dermatology

## 2020-08-02 ENCOUNTER — Encounter: Payer: Self-pay | Admitting: Physician Assistant

## 2020-08-02 ENCOUNTER — Other Ambulatory Visit: Payer: Self-pay

## 2020-08-02 ENCOUNTER — Ambulatory Visit (INDEPENDENT_AMBULATORY_CARE_PROVIDER_SITE_OTHER): Payer: 59 | Admitting: Physician Assistant

## 2020-08-02 VITALS — BP 138/90 | HR 77 | Temp 97.6°F | Ht 63.0 in | Wt 160.0 lb

## 2020-08-02 DIAGNOSIS — Z6828 Body mass index (BMI) 28.0-28.9, adult: Secondary | ICD-10-CM | POA: Diagnosis not present

## 2020-08-02 DIAGNOSIS — Z1239 Encounter for other screening for malignant neoplasm of breast: Secondary | ICD-10-CM | POA: Diagnosis not present

## 2020-08-02 DIAGNOSIS — E034 Atrophy of thyroid (acquired): Secondary | ICD-10-CM

## 2020-08-02 DIAGNOSIS — I10 Essential (primary) hypertension: Secondary | ICD-10-CM

## 2020-08-02 DIAGNOSIS — R1310 Dysphagia, unspecified: Secondary | ICD-10-CM

## 2020-08-02 DIAGNOSIS — E7800 Pure hypercholesterolemia, unspecified: Secondary | ICD-10-CM

## 2020-08-02 DIAGNOSIS — M79671 Pain in right foot: Secondary | ICD-10-CM

## 2020-08-02 DIAGNOSIS — Z1211 Encounter for screening for malignant neoplasm of colon: Secondary | ICD-10-CM

## 2020-08-02 DIAGNOSIS — Z0000 Encounter for general adult medical examination without abnormal findings: Secondary | ICD-10-CM | POA: Diagnosis not present

## 2020-08-02 DIAGNOSIS — E559 Vitamin D deficiency, unspecified: Secondary | ICD-10-CM

## 2020-08-02 MED ORDER — AMLODIPINE BESYLATE 5 MG PO TABS: 5.0000 mg | ORAL_TABLET | Freq: Every day | ORAL | 3 refills | Status: DC

## 2020-08-02 MED ORDER — PANTOPRAZOLE SODIUM 40 MG PO TBEC: 40.0000 mg | DELAYED_RELEASE_TABLET | Freq: Every day | ORAL | 3 refills | Status: DC

## 2020-08-02 MED ORDER — LEVOTHYROXINE SODIUM 25 MCG PO TABS: 25.0000 ug | ORAL_TABLET | Freq: Every day | ORAL | 3 refills | Status: DC
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
## 2020-08-02 NOTE — Patient Instructions (Signed)
Preventive Care 84-63 Years Old, Female Preventive care refers to lifestyle choices and visits with your health care provider that can promote health and wellness. This includes:  A yearly physical exam. This is also called an annual wellness visit.  Regular dental and eye exams.  Immunizations.  Screening for certain conditions.  Healthy lifestyle choices, such as: ? Eating a healthy diet. ? Getting regular exercise. ? Not using drugs or products that contain nicotine and tobacco. ? Limiting alcohol use. What can I expect for my preventive care visit? Physical exam Your health care provider will check your:  Height and weight. These may be used to calculate your BMI (body mass index). BMI is a measurement that tells if you are at a healthy weight.  Heart rate and blood pressure.  Body temperature.  Skin for abnormal spots. Counseling Your health care provider may ask you questions about your:  Past medical problems.  Family's medical history.  Alcohol, tobacco, and drug use.  Emotional well-being.  Home life and relationship well-being.  Sexual activity.  Diet, exercise, and sleep habits.  Work and work Statistician.  Access to firearms.  Method of birth control.  Menstrual cycle.  Pregnancy history. What immunizations do I need? Vaccines are usually given at various ages, according to a schedule. Your health care provider will recommend vaccines for you based on your age, medical history, and lifestyle or other factors, such as travel or where you work.   What tests do I need? Blood tests  Lipid and cholesterol levels. These may be checked every 5 years, or more often if you are over 3 years old.  Hepatitis C test.  Hepatitis B test. Screening  Lung cancer screening. You may have this screening every year starting at age 73 if you have a 30-pack-year history of smoking and currently smoke or have quit within the past 15 years.  Colorectal cancer  screening. ? All adults should have this screening starting at age 52 and continuing until age 17. ? Your health care provider may recommend screening at age 49 if you are at increased risk. ? You will have tests every 1-10 years, depending on your results and the type of screening test.  Diabetes screening. ? This is done by checking your blood sugar (glucose) after you have not eaten for a while (fasting). ? You may have this done every 1-3 years.  Mammogram. ? This may be done every 1-2 years. ? Talk with your health care provider about when you should start having regular mammograms. This may depend on whether you have a family history of breast cancer.  BRCA-related cancer screening. This may be done if you have a family history of breast, ovarian, tubal, or peritoneal cancers.  Pelvic exam and Pap test. ? This may be done every 3 years starting at age 10. ? Starting at age 11, this may be done every 5 years if you have a Pap test in combination with an HPV test. Other tests  STD (sexually transmitted disease) testing, if you are at risk.  Bone density scan. This is done to screen for osteoporosis. You may have this scan if you are at high risk for osteoporosis. Talk with your health care provider about your test results, treatment options, and if necessary, the need for more tests. Follow these instructions at home: Eating and drinking  Eat a diet that includes fresh fruits and vegetables, whole grains, lean protein, and low-fat dairy products.  Take vitamin and mineral supplements  as recommended by your health care provider.  Do not drink alcohol if: ? Your health care provider tells you not to drink. ? You are pregnant, may be pregnant, or are planning to become pregnant.  If you drink alcohol: ? Limit how much you have to 0-1 drink a day. ? Be aware of how much alcohol is in your drink. In the [place] one drink equals one 12 oz bottle of beer (355 mL), one 5 oz glass of  wine (148 mL), or one 1 oz glass of hard liquor (44 mL).   Lifestyle  Take daily care of your teeth and gums. Brush your teeth every morning and night with fluoride toothpaste. Floss one time each day.  Stay active. Exercise for at least 30 minutes 5 or more days each week.  Do not use any products that contain nicotine or tobacco, such as cigarettes, e-cigarettes, and chewing tobacco. If you need help quitting, ask your health care provider.  Do not use drugs.  If you are sexually active, practice safe sex. Use a condom or other form of protection to prevent STIs (sexually transmitted infections).  If you do not wish to become pregnant, use a form of birth control. If you plan to become pregnant, see your health care provider for a prepregnancy visit.  If told by your health care provider, take low-dose aspirin daily starting at age 50.  Find healthy ways to cope with stress, such as: ? Meditation, yoga, or listening to music. ? Journaling. ? Talking to a trusted person. ? Spending time with friends and family. Safety  Always wear your seat belt while driving or riding in a vehicle.  Do not drive: ? If you have been drinking alcohol. Do not ride with someone who has been drinking. ? When you are tired or distracted. ? While texting.  Wear a helmet and other protective equipment during sports activities.  If you have firearms in your house, make sure you follow all gun safety procedures. What's next?  Visit your health care provider once a year for an annual wellness visit.  Ask your health care provider how often you should have your eyes and teeth checked.  Stay up to date on all vaccines. This information is not intended to replace advice given to you by your health care provider. Make sure you discuss any questions you have with your health care provider. Document Revised: 01/27/2020 Document Reviewed: 01/03/2018 Elsevier Patient Education  2021 [employer]  

## 2020-08-03 LAB — CBC WITH DIFFERENTIAL/PLATELET
Basophils Absolute: 0.1 10*3/uL (ref 0.0–0.2)
Basos: 1 %
EOS (ABSOLUTE): 0.2 10*3/uL (ref 0.0–0.4)
Eos: 4 %
Hematocrit: 40.5 % (ref 34.0–46.6)
Hemoglobin: 13.5 g/dL (ref 11.1–15.9)
Immature Grans (Abs): 0 10*3/uL (ref 0.0–0.1)
Immature Granulocytes: 0 %
Lymphocytes Absolute: 1.3 10*3/uL (ref 0.7–3.1)
Lymphs: 26 %
MCH: 30.2 pg (ref 26.6–33.0)
MCHC: 33.3 g/dL (ref 31.5–35.7)
MCV: 91 fL (ref 79–97)
Monocytes Absolute: 0.4 10*3/uL (ref 0.1–0.9)
Monocytes: 9 %
Neutrophils Absolute: 3.1 10*3/uL (ref 1.4–7.0)
Neutrophils: 60 %
Platelets: 321 10*3/uL (ref 150–450)
RBC: 4.47 x10E6/uL (ref 3.77–5.28)
RDW: 13 % (ref 11.7–15.4)
WBC: 5.2 10*3/uL (ref 3.4–10.8)

## 2020-08-03 LAB — VITAMIN D 25 HYDROXY (VIT D DEFICIENCY, FRACTURES): Vit D, 25-Hydroxy: 42.4 ng/mL (ref 30.0–100.0)

## 2020-08-03 LAB — COMPREHENSIVE METABOLIC PANEL
ALT: 20 IU/L (ref 0–32)
AST: 24 IU/L (ref 0–40)
Albumin/Globulin Ratio: 1.8 (ref 1.2–2.2)
Albumin: 4.6 g/dL (ref 3.8–4.8)
Alkaline Phosphatase: 91 IU/L (ref 44–121)
BUN/Creatinine Ratio: 14 (ref 12–28)
BUN: 10 mg/dL (ref 8–27)
Bilirubin Total: 0.3 mg/dL (ref 0.0–1.2)
CO2: 24 mmol/L (ref 20–29)
Calcium: 9.7 mg/dL (ref 8.7–10.3)
Chloride: 107 mmol/L — ABNORMAL HIGH (ref 96–106)
Creatinine, Ser: 0.72 mg/dL (ref 0.57–1.00)
Globulin, Total: 2.5 g/dL (ref 1.5–4.5)
Glucose: 92 mg/dL (ref 65–99)
Potassium: 4.2 mmol/L (ref 3.5–5.2)
Sodium: 145 mmol/L — ABNORMAL HIGH (ref 134–144)
Total Protein: 7.1 g/dL (ref 6.0–8.5)
eGFR: 94 mL/min/{1.73_m2} (ref >59)

## 2020-08-03 LAB — LIPID PANEL WITH LDL/HDL RATIO
Cholesterol, Total: 193 mg/dL (ref 100–199)
HDL: 60 mg/dL (ref >39)
LDL Chol Calc (NIH): 110 mg/dL — ABNORMAL HIGH (ref 0–99)
LDL/HDL Ratio: 1.8 ratio (ref 0.0–3.2)
Triglycerides: 130 mg/dL (ref 0–149)
VLDL Cholesterol Cal: 23 mg/dL (ref 5–40)

## 2020-08-03 LAB — HEMOGLOBIN A1C
Est. average glucose Bld gHb Est-mCnc: 111 mg/dL
Hgb A1c MFr Bld: 5.5 % (ref 4.8–5.6)

## 2020-08-03 LAB — TSH: TSH: 1.65 u[IU]/mL (ref 0.450–4.500)

## 2020-08-04 ENCOUNTER — Ambulatory Visit (INDEPENDENT_AMBULATORY_CARE_PROVIDER_SITE_OTHER): Payer: 59

## 2020-08-04 ENCOUNTER — Ambulatory Visit (INDEPENDENT_AMBULATORY_CARE_PROVIDER_SITE_OTHER): Payer: 59 | Admitting: Podiatry

## 2020-08-04 ENCOUNTER — Other Ambulatory Visit: Payer: Self-pay

## 2020-08-04 ENCOUNTER — Encounter: Payer: Self-pay | Admitting: Podiatry

## 2020-08-04 DIAGNOSIS — M778 Other enthesopathies, not elsewhere classified: Secondary | ICD-10-CM

## 2020-08-04 MED ORDER — TRIAMCINOLONE ACETONIDE 40 MG/ML IJ SUSP
20.0000 mg | Freq: Once | INTRAMUSCULAR | Status: AC
  Administered 2020-08-04: 20 mg
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
## 2020-09-08 ENCOUNTER — Encounter: Payer: Self-pay | Admitting: Podiatry

## 2020-09-08 ENCOUNTER — Ambulatory Visit: Payer: 59 | Admitting: Podiatry

## 2020-09-08 ENCOUNTER — Other Ambulatory Visit: Payer: Self-pay

## 2020-09-08 DIAGNOSIS — M778 Other enthesopathies, not elsewhere classified: Secondary | ICD-10-CM

## 2020-09-08 MED ORDER — TRIAMCINOLONE ACETONIDE 40 MG/ML IJ SUSP
20.0000 mg | Freq: Once | INTRAMUSCULAR | Status: AC
  Administered 2020-09-08: 20 mg

## 2020-09-08 NOTE — Progress Notes (Signed)
She presents today for follow-up of her capsulitis right foot dorsal aspect.  States that it is some better and.  Objective: Vital signs are stable she is alert and oriented x3.  Pulses are palpable.  She still has tenderness on palpation dorsal aspect of the foot.  Assessment: Capsulitis dorsal aspect right foot osteoarthritis.  Plan: I injected the area today again with 10 mg Kenalog 5 mg Marcaine point maximal tenderness.  Follow-up with her in 1 month if necessary.

## 2020-09-15 ENCOUNTER — Telehealth: Payer: Self-pay

## 2020-09-15 NOTE — Telephone Encounter (Signed)
Called and spoke with patient and gave her information to [facility]  Where she can get testing. KW

## 2020-09-15 NOTE — Telephone Encounter (Signed)
Copied from [place] [id number]. Topic: General - Other >> Sep 15, 2020 10:24 AM Leward Quan A wrote: Reason for [id number] called in to inquire of Dr B to know if Covid test are still done here at the office. Patient having chills, body aches, cough and tiredness. Please call   Ph# [phone]

## 2020-09-17 NOTE — Telephone Encounter (Signed)
LMTCB

## 2020-09-17 NOTE — Telephone Encounter (Signed)
Patient called in to inquire of a provider what to do stated that the Covid test that she took on 09/15/20 but that it was negative. But still have fever and diarrhea. Will feel better but it starts again asking for a call back today at  Ph# [phone]

## 2020-09-17 NOTE — Telephone Encounter (Signed)
Pt returning call. Attempted to reach office 3x with no response. Please Advise.

## 2020-09-20 ENCOUNTER — Other Ambulatory Visit: Payer: Self-pay

## 2020-09-20 ENCOUNTER — Ambulatory Visit
Admission: EM | Admit: 2020-09-20 | Discharge: 2020-09-20 | Disposition: A | Discharge status: Home / Self Care | Payer: 59

## 2020-09-20 DIAGNOSIS — B349 Viral infection, unspecified: Secondary | ICD-10-CM

## 2020-09-20 DIAGNOSIS — R197 Diarrhea, unspecified: Secondary | ICD-10-CM | POA: Diagnosis not present

## 2020-09-20 NOTE — Telephone Encounter (Signed)
Noted  

## 2020-09-20 NOTE — Telephone Encounter (Signed)
Pt. Called back. States she went to [facility] today. States they told her "the diarrhea is viral." Will continue to hydrate and will call as needed.
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
## 2020-09-20 NOTE — Discharge Instructions (Signed)
Go to the emergency department if you are unable to keep yourself hydrated at home.    Follow the attached diarrhea diet.    Follow up with your primary care provider if your symptoms are not improving.

## 2020-09-20 NOTE — Telephone Encounter (Signed)
FYI see message below. KW

## 2020-09-20 NOTE — Telephone Encounter (Signed)
lmtcb okay for [facility] triag to triage patient when she returns call back. KW

## 2020-09-20 NOTE — ED Triage Notes (Signed)
Patient states that she has been having diarrhea, abdominal pain and fever since last Monday. States that she did start taking probiotics without relief.

## 2020-10-18 ENCOUNTER — Other Ambulatory Visit: Payer: Self-pay | Admitting: Dermatology

## 2020-10-18 DIAGNOSIS — L0291 Cutaneous abscess, unspecified: Secondary | ICD-10-CM

## 2020-10-25 ENCOUNTER — Ambulatory Visit: Payer: 59 | Admitting: Podiatry

## 2020-10-25 ENCOUNTER — Encounter: Payer: Self-pay | Admitting: Podiatry

## 2020-10-25 ENCOUNTER — Other Ambulatory Visit: Payer: Self-pay

## 2020-10-25 DIAGNOSIS — M778 Other enthesopathies, not elsewhere classified: Secondary | ICD-10-CM | POA: Diagnosis not present

## 2020-10-25 MED ORDER — TRIAMCINOLONE ACETONIDE 40 MG/ML IJ SUSP
20.0000 mg | Freq: Once | INTRAMUSCULAR | Status: AC
  Administered 2020-10-25: 20 mg

## 2020-10-25 NOTE — Progress Notes (Signed)
Presents today for follow-up capsulitis dorsal midfoot right.  States this really been hurting lately.  Objective: Vital signs are stable alert oriented x3.  Pulses are palpable.  She still has tenderness on palpation of a.m. arthritic area of the dorsal aspect of the right foot where the deep peroneal nerve crosses the area.  Assessment: Deep peroneal nerve neuritis and capsulitis dorsal aspect right foot.  Plan: Encouraged her to use the Voltaren gel.  I also injected 5 mg of Kenalog to the area with local anesthetic.  She understands and is amendable to a follow-up with me as needed.

## 2020-12-03 ENCOUNTER — Telehealth (INDEPENDENT_AMBULATORY_CARE_PROVIDER_SITE_OTHER): Payer: 59 | Admitting: Family Medicine

## 2020-12-03 DIAGNOSIS — R059 Cough, unspecified: Secondary | ICD-10-CM | POA: Diagnosis not present

## 2020-12-03 DIAGNOSIS — J069 Acute upper respiratory infection, unspecified: Secondary | ICD-10-CM | POA: Diagnosis not present

## 2020-12-03 MED ORDER — DOXYCYCLINE HYCLATE 100 MG PO TABS: 100.0000 mg | ORAL_TABLET | Freq: Two times a day (BID) | ORAL | 0 refills | Status: AC

## 2020-12-03 MED ORDER — BENZONATATE 200 MG PO CAPS: ORAL_CAPSULE | ORAL | 0 refills | Status: DC

## 2020-12-03 NOTE — Progress Notes (Signed)
MyChart Video Visit    Virtual Visit via Video Note   This visit type was conducted due to national recommendations for restrictions regarding the COVID-19 Pandemic (e.g. social distancing) in an effort to limit this patient's exposure and mitigate transmission in our community. This patient is at least at moderate risk for complications without adequate follow up. This format is felt to be most appropriate for this patient at this time. Physical exam was limited by quality of the video and audio technology used for the visit.   Patient location: home Provider location: [facility]  I discussed the limitations of evaluation and management by telemedicine and the availability of in person appointments. The patient expressed understanding and agreed to proceed.  Patient: Gina [name]   DOB: [birth year]   65 y.o. Female  MRN: [id number] Visit Date: 12/03/2020  Today's healthcare provider: Lelon Huh, MD   No chief complaint on file.  Subjective    HPI  Complains of persistent cough and post nasal drainage for 2-3 days. Started having aching, malaise and sweats five days ago. Sore throat for a few days. Lost voice yesterday. Cough sometimes productive yellow mucous. Not short of breath. Took home Covid test 2-3 days ago.     Medications: Outpatient Medications Prior to Visit  Medication Sig   albuterol (VENTOLIN HFA) 108 (90 Base) MCG/ACT inhaler TAKE 2 PUFFS BY MOUTH EVERY 6 HOURS AS NEEDED FOR WHEEZE OR SHORTNESS OF BREATH   ALPRAZolam (XANAX) 0.5 MG tablet TAKE 1 TABLET BY MOUTH EVERY DAY AND 1 TABLET AT BEDTIME   amLODipine (NORVASC) 5 MG tablet Take 1 tablet (5 mg total) by mouth at bedtime.   Apremilast (OTEZLA) 30 MG TABS Take 1 tablet (30 mg total) by mouth 2 (two) times daily.   azelastine (ASTELIN) 0.1 % nasal spray PLACE 1 SPRAY INTO THE NOSE 2 (TWO) TIMES DAILY.   B COMPLEX VITAMINS PO Take 1 tablet by mouth daily.   calcipotriene (DOVONOX) 0.005 % cream Apply topically  daily. To psoriasis as needed 7 days a week   Calcipotriene-Betameth Diprop (ENSTILAR) 0.005-0.064 % FOAM Apply to aa's psoriasis BID 5 days per week PRN.   cetirizine (ZYRTEC) 10 MG tablet Take 10 mg by mouth See admin instructions. Take 10 mg by mouth every other night   Cholecalciferol (VITAMIN D3) 1000 UNITS CAPS Take 1,000 Units by mouth at bedtime.    diphenhydrAMINE (BENADRYL) 25 mg capsule Take 25 mg by mouth at bedtime.   fluticasone (FLONASE) 50 MCG/ACT nasal spray SPRAY 2 SPRAYS INTO EACH NOSTRIL EVERY DAY   Halobetasol Propionate (BRYHALI) 0.01 % LOTN Apply 1 application topically as directed. Apply daily as needed to psoriasis up to 5 days per week. Avoid face, groin and underarms   ibuprofen (ADVIL) 600 MG tablet TAKE 1 TABLET BY MOUTH EVERY 6 HOURS AS NEEDED   ketoconazole (NIZORAL) 2 % cream Apply under the breast daily   levothyroxine (SYNTHROID) 25 MCG tablet Take 1 tablet (25 mcg total) by mouth daily before breakfast.   mupirocin ointment (BACTROBAN) 2 % APPLY TO RUPTURED CYST UNDER THE RIGHT BREAST EVERY DAY UNTIL HEALED   nystatin cream (MYCOSTATIN) APPLY TO AFFECTED AREA TWICE A DAY   pantoprazole (PROTONIX) 40 MG tablet Take 1 tablet (40 mg total) by mouth daily.   No facility-administered medications prior to visit.        Objective    There were no vitals taken for this visit.   Physical Exam   Awake,  alert, oriented x 3. In no apparent distress   Assessment & Plan     1. Upper respiratory tract infection, unspecified type Symptom onset 5 days ago. She has not had Covid booster. I counseled her that at at home covid tests often result as false negatives early in the course. Advised that she should repeat test today since she is on her last day of being in the antiviral treatment window. She states it feels like previous episodes of bronchitis treatment with doxy and azithromycin.   - doxycycline (VIBRA-TABS) 100 MG tablet; Take 1 tablet (100 mg total) by  mouth 2 (two) times daily for 7 days.  Dispense: 14 tablet; Refill: 0  2. Cough  - benzonatate (TESSALON) 200 MG capsule; TAKE 1 CAPSULE BY MOUTH EVERY DAY 3 TIMES A DAY AS NEEDED FOR COUGH  Dispense: 30 capsule; Refill: 0  Call if symptoms change or if not rapidly improving.    Video connection was lost when less than 50% of the duration of the visit was complete, at which time the remainder of the visit was completed via audio only.     I discussed the assessment and treatment plan with the patient. The patient was provided an opportunity to ask questions and all were answered. The patient agreed with the plan and demonstrated an understanding of the instructions.   The patient was advised to call back or seek an in-person evaluation if the symptoms worsen or if the condition fails to improve as anticipated.  I provided 9 minutes of non-face-to-face time during this encounter.  The entirety of the information documented in the History of Present Illness, Review of Systems and Physical Exam were personally obtained by me. Portions of this information were initially documented by the CMA and reviewed by me for thoroughness and accuracy.    Lelon Huh, MD [facility] [phone] (phone) [phone] (fax)  [place]

## 2020-12-04 ENCOUNTER — Other Ambulatory Visit: Payer: Self-pay | Admitting: Physician Assistant

## 2020-12-04 DIAGNOSIS — F419 Anxiety disorder, unspecified: Secondary | ICD-10-CM

## 2020-12-21 ENCOUNTER — Encounter: Payer: Self-pay | Admitting: Family Medicine

## 2020-12-21 ENCOUNTER — Other Ambulatory Visit: Payer: Self-pay

## 2020-12-21 ENCOUNTER — Ambulatory Visit (INDEPENDENT_AMBULATORY_CARE_PROVIDER_SITE_OTHER): Payer: 59 | Admitting: Family Medicine

## 2020-12-21 ENCOUNTER — Other Ambulatory Visit: Payer: Self-pay | Admitting: Dermatology

## 2020-12-21 ENCOUNTER — Other Ambulatory Visit: Payer: Self-pay | Admitting: Family Medicine

## 2020-12-21 ENCOUNTER — Telehealth: Payer: Self-pay

## 2020-12-21 VITALS — BP 132/84 | HR 74 | Resp 16 | Wt 155.0 lb

## 2020-12-21 DIAGNOSIS — N342 Other urethritis: Secondary | ICD-10-CM | POA: Diagnosis not present

## 2020-12-21 DIAGNOSIS — R319 Hematuria, unspecified: Secondary | ICD-10-CM | POA: Diagnosis not present

## 2020-12-21 DIAGNOSIS — Z1231 Encounter for screening mammogram for malignant neoplasm of breast: Secondary | ICD-10-CM

## 2020-12-21 LAB — POCT URINALYSIS DIPSTICK
Bilirubin, UA: NEGATIVE
Blood, UA: POSITIVE
Glucose, UA: NEGATIVE
Ketones, UA: NEGATIVE
Nitrite, UA: NEGATIVE
Protein, UA: NEGATIVE
Spec Grav, UA: 1.02 (ref 1.010–1.025)
Urobilinogen, UA: 0.2 E.U./dL
pH, UA: 6 (ref 5.0–8.0)

## 2020-12-21 MED ORDER — ESTRADIOL 0.1 MG/GM VA CREA: TOPICAL_CREAM | VAGINAL | 12 refills | Status: DC

## 2020-12-21 NOTE — Progress Notes (Signed)
I,[name],acting as a scribe for Gina Durie, MD.,have documented all relevant documentation on the behalf of Gina Durie, MD,as directed by  Gina Durie, MD while in the presence of Gina Durie, MD.   Established patient visit   Patient: Gina [name]   DOB: [birth year]   65 y.o. Female  MRN: [id number] Visit Date: 12/21/2020  Today's healthcare provider: [name], MD   Chief Complaint  Patient presents with   Hematuria   Subjective  ------------------------------------------------------------------------------------------------------------- Patient comes in today as a non-smoker with 3 days of hematuria and blood on the toilet paper from voiding.  She saw GYN 2 years ago for the same symptoms. She admits to vaginal dryness since menopause.  No other symptoms.  No vaginal bleeding. Certainly no menstrual flow. Hematuria This is a new problem. The current episode started in the past 7 days (2 days). The problem has been waxing and waning since onset. She reports clotting at the beginning of her urine stream. Her pain is at a severity of 1/10. The pain is mild. She describes her urine color as dark red. Irritative symptoms include nocturia. Obstructive symptoms do not include dribbling, incomplete emptying, an intermittent stream, a slower stream, straining or a weak stream. Associated symptoms include genital pain and urinary retention. Pertinent negatives include no abdominal pain, bladder pain, bone pain, chills, dysuria, facial swelling, fever, flank pain, hematospermia, hesitancy, inability to urinate, nausea, vomiting or weight loss.     Patient states she saw blood on her tissue paper after urinating 2 days ago. There was also a small amount of blood in the toilet. Patient states she has experienced this several times over the past 2 days. Patient states she is also having irritation just inside her vaginal area.        Medications: Outpatient Medications Prior to Visit  Medication Sig   albuterol (VENTOLIN HFA) 108 (90 Base) MCG/ACT inhaler TAKE 2 PUFFS BY MOUTH EVERY 6 HOURS AS NEEDED FOR WHEEZE OR SHORTNESS OF BREATH   ALPRAZolam (XANAX) 0.5 MG tablet TAKE 1 TABLET BY MOUTH EVERY DAY AND 1 TABLET AT BEDTIME   amLODipine (NORVASC) 5 MG tablet Take 1 tablet (5 mg total) by mouth at bedtime.   Apremilast (OTEZLA) 30 MG TABS Take 1 tablet (30 mg total) by mouth 2 (two) times daily.   azelastine (ASTELIN) 0.1 % nasal spray PLACE 1 SPRAY INTO THE NOSE 2 (TWO) TIMES DAILY.   B COMPLEX VITAMINS PO Take 1 tablet by mouth daily.   benzonatate (TESSALON) 200 MG capsule TAKE 1 CAPSULE BY MOUTH EVERY DAY 3 TIMES A DAY AS NEEDED FOR COUGH   calcipotriene (DOVONOX) 0.005 % cream Apply topically daily. To psoriasis as needed 7 days a week   Calcipotriene-Betameth Diprop (ENSTILAR) 0.005-0.064 % FOAM Apply to aa's psoriasis BID 5 days per week PRN.   cetirizine (ZYRTEC) 10 MG tablet Take 10 mg by mouth See admin instructions. Take 10 mg by mouth every other night   Cholecalciferol (VITAMIN D3) 1000 UNITS CAPS Take 1,000 Units by mouth at bedtime.    diphenhydrAMINE (BENADRYL) 25 mg capsule Take 25 mg by mouth at bedtime.   fluticasone (FLONASE) 50 MCG/ACT nasal spray SPRAY 2 SPRAYS INTO EACH NOSTRIL EVERY DAY   Halobetasol Propionate (BRYHALI) 0.01 % LOTN Apply 1 application topically as directed. Apply daily as needed to psoriasis up to 5 days per week. Avoid face, groin and underarms   ibuprofen (ADVIL) 600 MG  tablet TAKE 1 TABLET BY MOUTH EVERY 6 HOURS AS NEEDED   ketoconazole (NIZORAL) 2 % cream Apply under the breast daily   levothyroxine (SYNTHROID) 25 MCG tablet Take 1 tablet (25 mcg total) by mouth daily before breakfast.   mupirocin ointment (BACTROBAN) 2 % APPLY TO RUPTURED CYST UNDER THE RIGHT BREAST EVERY DAY UNTIL HEALED   nystatin cream (MYCOSTATIN) APPLY TO AFFECTED AREA TWICE A DAY   pantoprazole  (PROTONIX) 40 MG tablet Take 1 tablet (40 mg total) by mouth daily.   No facility-administered medications prior to visit.    Review of Systems  Constitutional:  Negative for appetite change, chills, fatigue, fever and weight loss.  HENT:  Negative for facial swelling.   Respiratory:  Negative for chest tightness and shortness of breath.   Cardiovascular:  Negative for chest pain and palpitations.  Gastrointestinal:  Negative for abdominal pain, nausea and vomiting.  Genitourinary:  Positive for hematuria and nocturia. Negative for dysuria, flank pain, hesitancy and incomplete emptying.  Neurological:  Negative for dizziness and weakness.       Objective  -------------------------------------------------------------------------------------------------------------------- BP 132/84 (BP Location: Left Arm, Patient Position: Sitting, Cuff Size: Normal)   Pulse 74   Resp 16   Wt 155 lb (70.3 kg)   SpO2 97%   BMI 27.46 kg/m  BP Readings from Last 3 Encounters:  12/21/20 132/84  09/20/20 130/73  08/02/20 138/90   Wt Readings from Last 3 Encounters:  12/21/20 155 lb (70.3 kg)  09/20/20 160 lb 0.9 oz (72.6 kg)  08/02/20 160 lb (72.6 kg)       Physical Exam Vitals reviewed.  Constitutional:      General: She is not in acute distress. HENT:     Head: Normocephalic and atraumatic.     Right Ear: External ear normal.  Eyes:     General: No scleral icterus.    Conjunctiva/sclera: Conjunctivae normal.  Pulmonary:     Effort: No respiratory distress.  Abdominal:     Palpations: Abdomen is soft.     Comments: No CVAT  Skin:    General: Skin is warm and dry.  Neurological:     Mental Status: She is alert and oriented to person, place, and time.  Psychiatric:        Mood and Affect: Mood normal.        Behavior: Behavior normal.        Thought Content: Thought content normal.        Judgment: Judgment normal.      No results found for any visits on 12/21/20.  Assessment &  Plan  ---------------------------------------------------------------------------------------------------------------------- 1. Hematuria, unspecified type Urology referral at this persist. GU- POCT urinalysis dipstick - CULTURE, URINE COMPREHENSIVE  2. Encounter for screening mammogram for malignant neoplasm of breast  - MM Digital Screening; Future  3. 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  Follow-up in a couple of months with new provider. - estradiol (ESTRACE VAGINAL) 0.1 MG/GM vaginal cream; Apply 1 dab around the urethra opening daily at bedtime.  Dispense: 42.5 g; Refill: 12   No follow-ups on file.      I, Gina Durie, MD, have reviewed all documentation for this visit. The documentation on 12/28/20 for the exam, diagnosis, procedures, and orders are all accurate and complete.    Lygia Olaes Cranford Mon, MD  [facility] [phone] (phone) [phone] (fax)  [place]

## 2020-12-21 NOTE — Telephone Encounter (Signed)
Spoke with patient regarding Fluconazole refill request. She states she is having a rash break out under her breast and trying to take of this before it spreads and gets much worse. Okay for 1 RF?

## 2020-12-24 LAB — CULTURE, URINE COMPREHENSIVE

## 2020-12-27 ENCOUNTER — Telehealth: Payer: Self-pay

## 2020-12-27 NOTE — Telephone Encounter (Signed)
Copied from Ernest [id number]. Topic: General - Other >> Dec 27, 2020  9:10 AM Celene Kras wrote: Reason for CRM: Pt called stating that her lab results have come in and she is concerned about her UA. Please advise.

## 2020-12-27 NOTE — Telephone Encounter (Signed)
Pt. Given results, verbalizes understanding. 

## 2020-12-27 NOTE — Telephone Encounter (Signed)
Left message to call back. [place] for [facility] to triage or give results. CRM created.

## 2020-12-28 NOTE — Telephone Encounter (Signed)
Left message to call back. Ok for [facility] to advise of results.

## 2020-12-30 NOTE — Telephone Encounter (Signed)
Unable to contact the patient. Will save message to chart.

## 2021-01-04 ENCOUNTER — Other Ambulatory Visit: Payer: Self-pay | Admitting: Dermatology

## 2021-01-04 ENCOUNTER — Ambulatory Visit: Payer: 59

## 2021-01-04 DIAGNOSIS — L409 Psoriasis, unspecified: Secondary | ICD-10-CM

## 2021-01-12 ENCOUNTER — Ambulatory Visit (INDEPENDENT_AMBULATORY_CARE_PROVIDER_SITE_OTHER): Payer: 59 | Admitting: Dermatology

## 2021-01-12 ENCOUNTER — Other Ambulatory Visit: Payer: Self-pay

## 2021-01-12 DIAGNOSIS — L408 Other psoriasis: Secondary | ICD-10-CM

## 2021-01-12 DIAGNOSIS — L570 Actinic keratosis: Secondary | ICD-10-CM

## 2021-01-12 DIAGNOSIS — L409 Psoriasis, unspecified: Secondary | ICD-10-CM | POA: Diagnosis not present

## 2021-01-12 DIAGNOSIS — L304 Erythema intertrigo: Secondary | ICD-10-CM | POA: Diagnosis not present

## 2021-01-12 DIAGNOSIS — L578 Other skin changes due to chronic exposure to nonionizing radiation: Secondary | ICD-10-CM

## 2021-01-12 DIAGNOSIS — L821 Other seborrheic keratosis: Secondary | ICD-10-CM

## 2021-01-12 DIAGNOSIS — L820 Inflamed seborrheic keratosis: Secondary | ICD-10-CM

## 2021-01-12 NOTE — Patient Instructions (Signed)

## 2021-01-12 NOTE — Progress Notes (Signed)
Follow-Up Visit   Subjective  Gina [name] is a 65 y.o. female who presents for the following: Psoriasis (Patient currently using Bryhali and Otezla '30mg'$  po QD and c/o h/a but isn't sure if related to [place]). She has noticed a lesion on her L lat brow and R neck that she would like checked today.   The following portions of the chart were reviewed this encounter and updated as appropriate:   Tobacco  Allergies  Meds  Problems  Med Hx  Surg Hx  Fam Hx     Review of Systems:  No other skin or systemic complaints except as noted in HPI or Assessment and Plan.  Objective  Well appearing patient in no apparent distress; mood and affect are within normal limits.  A focused examination was performed including the face, trunk, extremities. Relevant physical exam findings are noted in the Assessment and Plan.  L lat brow x 1 Erythematous thin papules/macules with gritty scale.   R lat neck x 1 Erythematous keratotic or waxy stuck-on papule or plaque.   Inframammary Erythema.   Assessment & Plan  AK (actinic keratosis) L lat brow x 1  Destruction of lesion - L lat brow x 1 Complexity: simple   Destruction method: cryotherapy   Informed consent: discussed and consent obtained   Timeout:  patient name, date of birth, surgical site, and procedure verified Lesion destroyed using liquid nitrogen: Yes   Region frozen until ice ball extended beyond lesion: Yes   Outcome: patient tolerated procedure well with no complications   Post-procedure details: wound care instructions given    Inflamed seborrheic keratosis R lat neck x 1  Destruction of lesion - R lat neck x 1 Complexity: simple   Destruction method: cryotherapy   Informed consent: discussed and consent obtained   Timeout:  patient name, date of birth, surgical site, and procedure verified Lesion destroyed using liquid nitrogen: Yes   Region frozen until ice ball extended beyond lesion: Yes   Outcome: patient  tolerated procedure well with no complications   Post-procedure details: wound care instructions given    Psoriasis - With psoriasis inversa  Improved on current treatment with Otezla.  She has little to no side effects from medication and is pleased with her results.  She occasionally needs to use topical Bryhali for flares. Trunk, extremities Psoriasis is a chronic non-curable, but treatable genetic/hereditary disease that may have other systemic features affecting other organ systems such as joints (Psoriatic Arthritis). It is associated with an increased risk of inflammatory bowel disease, heart disease, non-alcoholic fatty liver disease, and depression.    Continue Otezla '30mg'$  po QD. Side effects of Otezla (apremilast) include diarrhea, nausea, headache, upper respiratory infection, depression, and weight decrease (5-10%). It should only be taken by pregnant women after a discussion regarding risks and benefits with their doctor. Goal is control of skin condition, not cure.  The use of [place] Nail requires long term medication management, including periodic office visits.  Continue Bryhali lotion to aa's QD up to 5d/wk. Topical steroids (such as triamcinolone, fluocinolone, fluocinonide, mometasone, clobetasol, halobetasol, betamethasone, hydrocortisone) can cause thinning and lightening of the skin if they are used for too long in the same area. Your physician has selected the right strength medicine for your problem and area affected on the body. Please use your medication only as directed by your physician to prevent side effects.   OK to go off of Otezla for a bit to see if flares occur.  Related Medications OTEZLA 30 MG TABS TAKE 1 TABLET (30 MG TOTAL) BY MOUTH TWICE A DAY  Erythema intertrigo Inframammary With psoriasis inversa -  Continue Ketoconazole 2% cream to aa's QD. Related Medications ketoconazole (NIZORAL) 2 % cream Apply under the breast daily  Seborrheic Keratoses -  Stuck-on, waxy, tan-brown papules and/or plaques  - Benign-appearing - Discussed benign etiology and prognosis. - Observe - Call for any changes  Actinic Damage - chronic, secondary to cumulative UV radiation exposure/sun exposure over time - diffuse scaly erythematous macules with underlying dyspigmentation - Recommend daily broad spectrum sunscreen SPF 30+ to sun-exposed areas, reapply every 2 hours as needed.  - Recommend staying in the shade or wearing long sleeves, sun glasses (UVA+UVB protection) and wide brim hats (4-inch brim around the entire circumference of the hat). - Call for new or changing lesions.  Return in about 6 months (around 07/12/2021) for [facility] follow up .  Luther Redo, CMA, am acting as scribe for Sarina Ser, MD . Documentation: I have reviewed the above documentation for accuracy and completeness, and I agree with the above.  Sarina Ser, MD

## 2021-01-13 ENCOUNTER — Encounter: Payer: Self-pay | Admitting: Dermatology

## 2021-01-14 ENCOUNTER — Other Ambulatory Visit: Payer: Self-pay | Admitting: Family Medicine

## 2021-01-14 DIAGNOSIS — F419 Anxiety disorder, unspecified: Secondary | ICD-10-CM

## 2021-01-14 NOTE — Telephone Encounter (Signed)
LOV:  07/13/2020 NOV: 03/15/2021   Last Refill 12/06/2020 #60 0 Refills.

## 2021-01-14 NOTE — Telephone Encounter (Signed)
Requested medication (s) are due for refill today - yes  Requested medication (s) are on the active medication list -yes  Future visit scheduled -yes  Last refill: 12/06/20 #60  Notes to clinic: Request RF: non delegated Rx  Requested Prescriptions  Pending Prescriptions Disp Refills   ALPRAZolam (XANAX) 0.5 MG tablet [Pharmacy Med Name: ALPRAZOLAM 0.5 MG TABLET] 60 tablet     Sig: TAKE 1 TABLET BY MOUTH EVERY DAY AND 1 TABLET AT BEDTIME     Not Delegated - Psychiatry:  Anxiolytics/Hypnotics Failed - 01/14/2021 12:41 PM      Failed - This refill cannot be delegated      Failed - Urine Drug Screen completed in last 360 days      Passed - Valid encounter within last 6 months    Recent Outpatient Visits           3 weeks ago Hematuria, unspecified type   [facility] Jerrol Banana., MD   1 month ago Upper respiratory tract infection, unspecified type   [facility] Birdie Sons, MD   5 months ago Annual physical exam   [facility] [place] Malling M, [place]   7 months ago Acute non-recurrent pansinusitis   [facility] [place], [place], [place]   8 months ago Viral upper respiratory infection   [place], Kelby Aline, FNP       Future Appointments             In 2 months Gwyneth Sprout, [place], [place]   In 6 months Ralene Bathe, MD [place]   In 6 months Bacigalupo, Dionne Bucy, MD [facility]               Requested Prescriptions  Pending Prescriptions Disp Refills   ALPRAZolam Duanne Moron) 0.5 MG tablet [Pharmacy Med Name: ALPRAZOLAM 0.5 MG TABLET] 60 tablet     Sig: TAKE 1 TABLET BY MOUTH EVERY DAY AND 1 TABLET AT BEDTIME     Not Delegated - Psychiatry:  Anxiolytics/Hypnotics Failed - 01/14/2021 12:41 PM      Failed - This refill cannot be delegated      Failed - Urine Drug Screen completed in last 360 days       Passed - Valid encounter within last 6 months    Recent Outpatient Visits           3 weeks ago Hematuria, unspecified type   [facility] Jerrol Banana., MD   1 month ago Upper respiratory tract infection, unspecified type   [facility] Birdie Sons, MD   5 months ago Annual physical exam   [place], [place]   7 months ago Acute non-recurrent pansinusitis   [place], [place]   8 months ago Viral upper respiratory infection   [facility] [place], Kelby Aline, FNP       Future Appointments             In 2 months Gwyneth Sprout, [place], [place]   In 6 months Ralene Bathe, MD [place]   In 6 months Bacigalupo, Dionne Bucy, MD [facility]

## 2021-01-17 ENCOUNTER — Encounter: Payer: Self-pay | Admitting: Family Medicine

## 2021-01-17 ENCOUNTER — Other Ambulatory Visit: Payer: Self-pay

## 2021-01-17 ENCOUNTER — Ambulatory Visit (INDEPENDENT_AMBULATORY_CARE_PROVIDER_SITE_OTHER): Payer: 59 | Admitting: Family Medicine

## 2021-01-17 VITALS — BP 131/80 | HR 74 | Temp 97.7°F | Resp 16 | Wt 156.0 lb

## 2021-01-17 DIAGNOSIS — F5104 Psychophysiologic insomnia: Secondary | ICD-10-CM

## 2021-01-17 DIAGNOSIS — F411 Generalized anxiety disorder: Secondary | ICD-10-CM | POA: Diagnosis not present

## 2021-01-17 DIAGNOSIS — G4700 Insomnia, unspecified: Secondary | ICD-10-CM | POA: Insufficient documentation

## 2021-01-17 MED ORDER — ALPRAZOLAM 0.5 MG PO TABS: 0.5000 mg | ORAL_TABLET | Freq: Every evening | ORAL | 1 refills | Status: DC | PRN

## 2021-01-17 NOTE — Patient Instructions (Signed)
Zoloft, Effexor, Cymbalta, Celexa, Prozac, Trintellix (class of medicines is SSRI/SNRI)

## 2021-01-17 NOTE — Assessment & Plan Note (Signed)
As above, taking chronic benzos daily for sleep Consider trazodone in the future Discussed risks of chronic benzos
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
## 2021-01-17 NOTE — Assessment & Plan Note (Signed)
Chronic and fairly well controlled Not on SSRI or SNRI, only on chronic benzos She has been getting #60 per month but states she is only taking at bedtime, so will reduce to #30 Discussed importance of baseline control and inadequacy of chronic benzos for treatment of anxiety, as well as the risks She declines SSRI/SNRI (of note, took low dose Effexor once for post-concussive headaches) today Gave her some options to read about and told her that discussion would continue at next visit in 2 months

## 2021-01-18 ENCOUNTER — Ambulatory Visit
Admission: RE | Admit: 2021-01-18 | Discharge: 2021-01-18 | Disposition: A | Discharge status: Home / Self Care | Payer: 59 | Source: Ambulatory Visit | Attending: Family Medicine | Admitting: Family Medicine

## 2021-01-18 DIAGNOSIS — Z1231 Encounter for screening mammogram for malignant neoplasm of breast: Secondary | ICD-10-CM | POA: Insufficient documentation

## 2021-02-06 ENCOUNTER — Other Ambulatory Visit: Payer: Self-pay | Admitting: Dermatology

## 2021-02-28 ENCOUNTER — Other Ambulatory Visit: Payer: Self-pay | Admitting: Dermatology

## 2021-02-28 DIAGNOSIS — L409 Psoriasis, unspecified: Secondary | ICD-10-CM

## 2021-03-09 ENCOUNTER — Other Ambulatory Visit: Payer: Self-pay

## 2021-03-09 ENCOUNTER — Ambulatory Visit: Payer: 59 | Admitting: Podiatry

## 2021-03-09 DIAGNOSIS — G5731 Lesion of lateral popliteal nerve, right lower limb: Secondary | ICD-10-CM | POA: Diagnosis not present

## 2021-03-09 DIAGNOSIS — M778 Other enthesopathies, not elsewhere classified: Secondary | ICD-10-CM

## 2021-03-13 NOTE — Progress Notes (Signed)
  Subjective:  Patient ID: Gina [name], female    DOB: Jan 31, 1956,  MRN: [id number]  Chief Complaint  Patient presents with   Foot Pain    Right foot pain    65 y.o. female presents with the above complaint. History confirmed with patient.  Injections have been helpful and it wore off about a couple weeks ago  Objective:  Physical Exam: warm, good capillary refill, no trophic changes or ulcerative lesions, normal DP and PT pulses, and normal sensory exam.  Right Foot: Pain on palpation over the deep peroneal nerve where it enters into the first interspace Assessment:   1. Peroneal neuritis, right   2. Capsulitis of right foot      Plan:  Patient was evaluated and treated and all questions answered.  Repeat injection delivered to the affected interspace along the peroneal nerve.  After sterile prep with alcohol and Betadine I injected 5 mg of Kenalog and 4 mg of dexamethasone into the joint with 1 cc lidocaine.  She tolerated well.  It was dressed with bandage.  She will return as needed for this  No follow-ups on file.

## 2021-03-15 ENCOUNTER — Encounter: Payer: Self-pay | Admitting: Family Medicine

## 2021-03-15 ENCOUNTER — Other Ambulatory Visit: Payer: Self-pay

## 2021-03-15 ENCOUNTER — Ambulatory Visit (INDEPENDENT_AMBULATORY_CARE_PROVIDER_SITE_OTHER): Payer: 59 | Admitting: Family Medicine

## 2021-03-15 VITALS — BP 129/77 | HR 76 | Resp 16 | Wt 155.3 lb

## 2021-03-15 DIAGNOSIS — Z23 Encounter for immunization: Secondary | ICD-10-CM | POA: Insufficient documentation

## 2021-03-15 DIAGNOSIS — Z6827 Body mass index (BMI) 27.0-27.9, adult: Secondary | ICD-10-CM | POA: Diagnosis not present

## 2021-03-15 DIAGNOSIS — F5104 Psychophysiologic insomnia: Secondary | ICD-10-CM | POA: Insufficient documentation

## 2021-03-15 DIAGNOSIS — F411 Generalized anxiety disorder: Secondary | ICD-10-CM | POA: Diagnosis not present

## 2021-03-15 MED ORDER — ALPRAZOLAM 0.5 MG PO TABS: 0.5000 mg | ORAL_TABLET | Freq: Every evening | ORAL | 0 refills | Status: DC | PRN

## 2021-03-15 MED ORDER — TRAZODONE HCL 50 MG PO TABS: 25.0000 mg | ORAL_TABLET | Freq: Every evening | ORAL | 3 refills | Status: DC | PRN

## 2021-03-15 NOTE — Assessment & Plan Note (Signed)
provided ?

## 2021-03-15 NOTE — Assessment & Plan Note (Signed)
Refused any SSNI/SSRI at this time Chronic benzo use- working to wean Refill of xanax

## 2021-03-15 NOTE — Assessment & Plan Note (Signed)
BMI 27.5 Discussed importance of healthy weight management Discussed diet and exercise

## 2021-03-15 NOTE — Assessment & Plan Note (Signed)
Will work on slow wean of xanax- discussed 8 weeks is ideal Trazodone also provided

## 2021-03-15 NOTE — Progress Notes (Signed)
Established patient visit   Patient: Gina [name]   DOB: 09-24-1955   65 y.o. Female  MRN: [id number] Visit Date: 03/15/2021  Today's healthcare provider: Gwyneth Sprout, FNP   Chief Complaint  Patient presents with   Anxiety   Subjective    HPI  Anxiety, Follow-up  She was last seen for anxiety 2 months ago. Changes made at last visit include none continue Alprazolam 0.5mg  qhs PRN.   She reports excellent compliance with treatment. She reports excellent tolerance of treatment. She is not having side effects.   She feels her anxiety is moderate and Unchanged since last visit.  Symptoms: No chest pain No difficulty concentrating  No dizziness No fatigue  No feelings of losing control No insomnia  Yes irritable No palpitations  No panic attacks No racing thoughts  No shortness of breath No sweating  No tremors/shakes    GAD-7 Results GAD-7 Generalized Anxiety Disorder Screening Tool 01/17/2021  1. Feeling Nervous, Anxious, or on Edge 0  2. Not Being Able to Stop or Control Worrying 0  3. Worrying Too Much About Different Things 0  4. Trouble Relaxing 0  5. Being So Restless it's Hard To Sit Still 0  6. Becoming Easily Annoyed or Irritable 1  7. Feeling Afraid As If Something Awful Might Happen 0  Total GAD-7 Score 1  Difficulty At Work, Home, or Getting  Along With Others? Not difficult at all    PHQ-9 Scores PHQ9 SCORE ONLY 01/17/2021 08/02/2020 08/01/2019  PHQ-9 Total Score 1 1 2     ---------------------------------------------------------------------------------------------------     Medications: Outpatient Medications Prior to Visit  Medication Sig   albuterol (VENTOLIN HFA) 108 (90 Base) MCG/ACT inhaler TAKE 2 PUFFS BY MOUTH EVERY 6 HOURS AS NEEDED FOR WHEEZE OR SHORTNESS OF BREATH (Patient taking differently: as needed.)   amLODipine (NORVASC) 5 MG tablet Take 1 tablet (5 mg total) by mouth at bedtime.   calcipotriene (DOVONOX) 0.005 % cream  Apply topically daily. To psoriasis as needed 7 days a week   cetirizine (ZYRTEC) 10 MG tablet Take 10 mg by mouth See admin instructions. Take 10 mg by mouth every other night   Cholecalciferol (VITAMIN D3) 1000 UNITS CAPS Take 1,000 Units by mouth at bedtime.    estradiol (ESTRACE VAGINAL) 0.1 MG/GM vaginal cream Apply 1 dab around the urethra opening daily at bedtime. (Patient taking differently: Apply 1 dab around the urethra opening daily at bedtime. HAS NOT STARTED.)   fluconazole (DIFLUCAN) 200 MG tablet TAKE 1 TABLET (200 MG TOTAL) BY MOUTH AS DIRECTED. TAKE 1 TABLET BY MOUTH. REPEAT IF NEEDED.   fluticasone (FLONASE) 50 MCG/ACT nasal spray SPRAY 2 SPRAYS INTO EACH NOSTRIL EVERY DAY   ibuprofen (ADVIL) 600 MG tablet TAKE 1 TABLET BY MOUTH EVERY 6 HOURS AS NEEDED   ketoconazole (NIZORAL) 2 % cream Apply under the breast daily   levothyroxine (SYNTHROID) 25 MCG tablet Take 1 tablet (25 mcg total) by mouth daily before breakfast.   OTEZLA 30 MG TABS TAKE 1 TABLET (30MG  TOTAL) BY MOUTH TWICE A DAY   pantoprazole (PROTONIX) 40 MG tablet Take 1 tablet (40 mg total) by mouth daily.   [DISCONTINUED] ALPRAZolam (XANAX) 0.5 MG tablet Take 1 tablet (0.5 mg total) by mouth at bedtime as needed for anxiety.   No facility-administered medications prior to visit.    Review of Systems     Objective    BP 129/77   Pulse 76   Resp  16   Wt 155 lb 4.8 oz (70.4 kg)   SpO2 100%   BMI 27.51 kg/m    Physical Exam Vitals and nursing note reviewed.  Constitutional:      General: She is not in acute distress.    Appearance: Normal appearance. She is overweight. She is not ill-appearing, toxic-appearing or diaphoretic.  HENT:     Head: Normocephalic and atraumatic.  Cardiovascular:     Rate and Rhythm: Normal rate and regular rhythm.     Pulses: Normal pulses.     Heart sounds: Normal heart sounds. No murmur heard.   No friction rub. No gallop.  Pulmonary:     Effort: Pulmonary effort is  normal. No respiratory distress.     Breath sounds: Normal breath sounds. No stridor. No wheezing, rhonchi or rales.  Chest:     Chest wall: No tenderness.  Abdominal:     General: Bowel sounds are normal.     Palpations: Abdomen is soft.  Musculoskeletal:        General: No swelling, tenderness, deformity or signs of injury. Normal range of motion.     Right lower leg: No edema.     Left lower leg: No edema.  Skin:    General: Skin is warm and dry.     Capillary Refill: Capillary refill takes less than 2 seconds.     Coloration: Skin is not jaundiced or pale.     Findings: No bruising, erythema, lesion or rash.  Neurological:     General: No focal deficit present.     Mental Status: She is alert and oriented to person, place, and time. Mental status is at baseline.     Cranial Nerves: No cranial nerve deficit.     Sensory: No sensory deficit.     Motor: No weakness.     Coordination: Coordination normal.  Psychiatric:        Mood and Affect: Mood is anxious.        Behavior: Behavior normal.        Thought Content: Thought content normal.        Judgment: Judgment normal.     No results found for any visits on 03/15/21.  Assessment & Plan     Problem List Items Addressed This Visit       Other   GAD (generalized anxiety disorder) - Primary    Refused any SSNI/SSRI at this time Chronic benzo use- working to wean Refill of xanax       Relevant Medications   ALPRAZolam (XANAX) 0.5 MG tablet   traZODone (DESYREL) 50 MG tablet   Need for influenza vaccination    provided      Relevant Orders   Flu Vaccine QUAD 64mo+IM (Fluarix, Fluzone & Alfiuria Quad PF) (Completed)   Psychophysiological insomnia    Will work on slow wean of xanax- discussed 8 weeks is ideal Trazodone also provided      Relevant Medications   traZODone (DESYREL) 50 MG tablet   BMI 27.0-27.9,adult    BMI 27.5 Discussed importance of healthy weight management Discussed diet and exercise          Return for annual examination.      Vonna Kotyk, FNP, have reviewed all documentation for this visit. The documentation on 03/15/21 for the exam, diagnosis, procedures, and orders are all accurate and complete.    Gwyneth Sprout, [place] [phone] (phone) [phone] (fax)  [place]

## 2021-03-19 ENCOUNTER — Other Ambulatory Visit: Payer: Self-pay

## 2021-03-19 ENCOUNTER — Ambulatory Visit
Admission: EM | Admit: 2021-03-19 | Discharge: 2021-03-19 | Disposition: A | Discharge status: Home / Self Care | Payer: 59 | Attending: Emergency Medicine | Admitting: Emergency Medicine

## 2021-03-19 ENCOUNTER — Encounter: Payer: Self-pay | Admitting: Emergency Medicine

## 2021-03-19 DIAGNOSIS — R300 Dysuria: Secondary | ICD-10-CM | POA: Insufficient documentation

## 2021-03-19 DIAGNOSIS — R319 Hematuria, unspecified: Secondary | ICD-10-CM | POA: Diagnosis not present

## 2021-03-19 LAB — POCT URINALYSIS DIP (MANUAL ENTRY)
Bilirubin, UA: NEGATIVE
Glucose, UA: NEGATIVE mg/dL
Ketones, POC UA: NEGATIVE mg/dL
Leukocytes, UA: NEGATIVE
Nitrite, UA: NEGATIVE
Spec Grav, UA: >=1.03 — AB (ref 1.010–1.025)
Urobilinogen, UA: 0.2 E.U./dL
pH, UA: 5.5 (ref 5.0–8.0)

## 2021-03-19 NOTE — ED Triage Notes (Signed)
Pt noticed blood in her urine and she has lower back pain sxs started yesterday.

## 2021-03-19 NOTE — Discharge Instructions (Addendum)
Your urine sample today was sent for a culture.  If your urine indicates that you have an infection in the urine, we will call you to initiate antibiotic therapy.  If you do not receive a call in the next 3-4 days, it means that you do not need antibiotics for a urinary tract infection.  Please go to the ER for continued nausea, vomiting, unilateral back pain, or worsening urinary symptoms.

## 2021-03-19 NOTE — ED Provider Notes (Addendum)
Subjective:    Gina [name] is a very pleasant 65 y.o. female who presents with concerns for UTI due to noticing blood in urine and lower back pain that started yesterday. Patient reports that sometimes she has to wear a pad due to the bleeding.  Patient reports that she is uncertain whether this bleeding is from urination or from her vagina. No unilateral back pain, vomiting, fever, vaginal discharge, concern for STD.  Past medical history, past surgical history, current medications reviewed.  Allergies: is allergic to omeprazole.  Review of Systems See HPI   Objective:     Vitals:   03/19/21 1042  BP: (!) 143/81  Pulse: 77  Temp: 98 F (36.7 C)  SpO2: 98%     General: Appears well-developed and well-nourished. No acute distress.  Cardiovascular: Normal rate Pulm/Chest: No respiratory distress Abdominal: No CVAT.  Neurological: Alert and oriented to person, place, and time.  Skin: Skin is warm and dry.  Psychiatric: Normal mood, affect, behavior, and thought content.  GU:  Deferred secondary to self collect specimen  Laboratory:  Orders Placed This Encounter  Procedures   Urine Culture   POCT urinalysis dipstick   Results for orders placed or performed during the hospital encounter of 03/19/21  POCT urinalysis dipstick  Result Value Ref Range   Color, UA other (A) yellow   Clarity, UA cloudy (A) clear   Glucose, UA negative negative mg/dL   Bilirubin, UA negative negative   Ketones, POC UA negative negative mg/dL   Spec Grav, UA >=1.030 (A) 1.010 - 1.025   Blood, UA large (A) negative   pH, UA 5.5 5.0 - 8.0   Protein Ur, POC trace (A) negative mg/dL   Urobilinogen, UA 0.2 0.2 or 1.0 E.U./dL   Nitrite, UA Negative Negative   Leukocytes, UA Negative Negative     Assessment:   1. Dysuria - Urine Culture; Standing - Urine Culture  2. Hematuria, unspecified type  Plan:   MDM: Patient presents with concerns for UTI due to noticing blood in urine and  lower back pain that started yesterday.  Patient reports that sometimes she has to wear a pad due to the bleeding.  Patient reports that she is uncertain whether this bleeding is from urination or from her vagina.  No unilateral back pain, vomiting, fever, vaginal discharge, concern for STD.  Urinalysis reveals cloudy urine, increased specific gravity, large blood, trace protein.  Given the patient's concerns today in clinic without nitrites and leukocytes, low suspicion for UTI, but did send the urine for culture to confirm.  Advised to push fluids.  Emergency department for nausea, vomiting, unilateral back pain or worsening urinary symptoms.  Advised that if she continues to have bleeding and her urine culture is negative she will need to follow-up with her gynecologist for further evaluation.  Patient verbalized understanding and agreed with plan.  Patient stable upon discharge.  Return as needed.    Discharge Instructions      Your urine sample today was sent for a culture.  If your urine indicates that you have an infection in the urine, we will call you to initiate antibiotic therapy.  If you do not receive a call in the next 3-4 days, it means that you do not need antibiotics for a urinary tract infection.  Please go to the ER for continued nausea, vomiting, unilateral back pain, or worsening urinary symptoms.          Serafina Royals, [place] 03/19/21 1113  Serafina Royals, [place] 03/19/21 1114

## 2021-03-20 LAB — URINE CULTURE: Culture: <10000 — AB

## 2021-04-07 ENCOUNTER — Other Ambulatory Visit: Payer: Self-pay | Admitting: Family Medicine

## 2021-04-07 DIAGNOSIS — F5104 Psychophysiologic insomnia: Secondary | ICD-10-CM

## 2021-04-15 ENCOUNTER — Other Ambulatory Visit: Payer: Self-pay | Admitting: Family Medicine

## 2021-04-15 DIAGNOSIS — F411 Generalized anxiety disorder: Secondary | ICD-10-CM

## 2021-04-16 ENCOUNTER — Encounter: Payer: Self-pay | Admitting: Emergency Medicine

## 2021-04-16 ENCOUNTER — Ambulatory Visit
Admission: EM | Admit: 2021-04-16 | Discharge: 2021-04-16 | Disposition: A | Discharge status: Home / Self Care | Payer: 59 | Attending: Family Medicine | Admitting: Family Medicine

## 2021-04-16 ENCOUNTER — Other Ambulatory Visit: Payer: Self-pay

## 2021-04-16 DIAGNOSIS — Z1152 Encounter for screening for COVID-19: Secondary | ICD-10-CM

## 2021-04-16 DIAGNOSIS — J069 Acute upper respiratory infection, unspecified: Secondary | ICD-10-CM

## 2021-04-16 NOTE — Discharge Instructions (Addendum)
For symptom management recommend cetirizine or Claritin for nasal symptoms, Flonase, Mucinex Expectorant / cough suppressant (Green label).  Your COVID/ Flu results should result within 3-5 days. Negative results are immediately resulted to Mychart. Positive results will receive a follow-up call from our clinic. If symptoms are present, I recommend home quarantine until results are known.  Alternate Tylenol and ibuprofen as needed for body aches and fever.  Symptom management per recommendations discussed today.  If any breathing difficulty or chest pain develops go immediately to the closest emergency department for evaluation.

## 2021-04-16 NOTE — ED Triage Notes (Addendum)
Pt c/o sinus pain, cough, and drainage x 3 days. Pts daughter tested positive for Covid.
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
## 2021-04-18 LAB — COVID-19, FLU A+B NAA
Influenza A, NAA: NOT DETECTED
Influenza B, NAA: NOT DETECTED
SARS-CoV-2, NAA: NOT DETECTED

## 2021-05-02 ENCOUNTER — Other Ambulatory Visit: Payer: Self-pay | Admitting: Dermatology

## 2021-05-02 DIAGNOSIS — L304 Erythema intertrigo: Secondary | ICD-10-CM

## 2021-05-17 ENCOUNTER — Other Ambulatory Visit: Payer: Self-pay | Admitting: Family Medicine

## 2021-05-17 ENCOUNTER — Other Ambulatory Visit: Payer: Self-pay | Admitting: Physician Assistant

## 2021-05-17 DIAGNOSIS — E034 Atrophy of thyroid (acquired): Secondary | ICD-10-CM

## 2021-05-17 DIAGNOSIS — F411 Generalized anxiety disorder: Secondary | ICD-10-CM

## 2021-05-17 NOTE — Telephone Encounter (Signed)
Requested medication (s) are due for refill today - yes  Requested medication (s) are on the active medication list -yes  Future visit scheduled -yes  Last refill: 04/15/21 #30  Notes to clinic: Request RF: non delegated Rx  Requested Prescriptions  Pending Prescriptions Disp Refills   ALPRAZolam (XANAX) 0.5 MG tablet [Pharmacy Med Name: ALPRAZOLAM 0.5 MG TABLET] 30 tablet 0    Sig: TAKE 1 TABLET BY MOUTH AT BEDTIME AS NEEDED FOR ANXIETY.     Not Delegated - Psychiatry:  Anxiolytics/Hypnotics Failed - 05/17/2021 11:05 AM      Failed - This refill cannot be delegated      Failed - Urine Drug Screen completed in last 360 days      Passed - Valid encounter within last 6 months    Recent Outpatient Visits           2 months ago GAD (generalized anxiety disorder)   [facility] Tally Joe T, FNP   4 months ago GAD (generalized anxiety disorder)   [facility], Dionne Bucy, MD   4 months ago Hematuria, unspecified type   [facility] Jerrol Banana., MD   5 months ago Upper respiratory tract infection, unspecified type   [facility] Birdie Sons, MD   9 months ago Annual physical exam   [place], Clearnce Sorrel, [place]       Future Appointments             In 1 month Ralene Bathe, MD [place]   In 2 months Gwyneth Sprout, [place], [facility]               Requested Prescriptions  Pending Prescriptions Disp Refills   ALPRAZolam (XANAX) 0.5 MG tablet [Pharmacy Med Name: ALPRAZOLAM 0.5 MG TABLET] 30 tablet 0    Sig: TAKE 1 TABLET BY MOUTH AT BEDTIME AS NEEDED FOR ANXIETY.     Not Delegated - Psychiatry:  Anxiolytics/Hypnotics Failed - 05/17/2021 11:05 AM      Failed - This refill cannot be delegated      Failed - Urine Drug Screen completed in last 360 days      Passed - Valid encounter within last 6 months    Recent Outpatient Visits            2 months ago GAD (generalized anxiety disorder)   [facility] Tally Joe T, FNP   4 months ago GAD (generalized anxiety disorder)   [facility], Dionne Bucy, MD   4 months ago Hematuria, unspecified type   [facility] Jerrol Banana., MD   5 months ago Upper respiratory tract infection, unspecified type   [facility] Birdie Sons, MD   9 months ago Annual physical exam   [place], Clearnce Sorrel, [place]       Future Appointments             In 1 month Ralene Bathe, MD [place]   In 2 months Gwyneth Sprout, [place], [place]

## 2021-05-25 ENCOUNTER — Other Ambulatory Visit: Payer: Self-pay

## 2021-05-25 ENCOUNTER — Encounter: Payer: Self-pay | Admitting: Podiatry

## 2021-05-25 ENCOUNTER — Ambulatory Visit: Payer: 59 | Admitting: Podiatry

## 2021-05-25 DIAGNOSIS — M25571 Pain in right ankle and joints of right foot: Secondary | ICD-10-CM | POA: Diagnosis not present

## 2021-05-25 MED ORDER — CELECOXIB 100 MG PO CAPS: 100.0000 mg | ORAL_CAPSULE | Freq: Two times a day (BID) | ORAL | 2 refills | Status: DC

## 2021-05-25 NOTE — Progress Notes (Signed)
°  Subjective:  Patient ID: Gina [name], female    DOB: [birth year],  MRN: [id number]  Chief Complaint  Patient presents with   neuritis    Peroneal neuritis right foot flare up    66 y.o. female presents with the above complaint. History confirmed with patient.  Returns for follow-up with somewhat different pain than the last visit was doing well until she went to the beach for 5 days and was walking in the sand and the foot started hurting more towards the ankle.    Objective:  Physical Exam: warm, good capillary refill, no trophic changes or ulcerative lesions, normal DP and PT pulses, and normal sensory exam.  Right Foot: Pain and tenderness in the sinus tarsi  Assessment:   1. Sinus tarsi syndrome of right foot      Plan:  Patient was evaluated and treated and all questions answered.  Discussed etiology and treatment options for the condition in detail with the patient.  Discussed supportive shoe gear and likelihood of this being early arthritis in the subtalar joint.  I recommended corticosteroid injection which I did after sterile prep with Betadine and 10 mg of Kenalog and 5 mg 5 mg of Kenalog and 2 mg of dexamethasone with 1 cc lidocaine 2% plain was injected into the sinus tarsi.  She tolerated procedure well.  If not improving would probably recommend an MRI at some point to evaluate the joint surface.  Also sent Rx for Celebrex use of this was reviewed.   Return if symptoms worsen or fail to improve.

## 2021-06-02 ENCOUNTER — Encounter: Payer: Self-pay | Admitting: Family Medicine

## 2021-06-02 ENCOUNTER — Other Ambulatory Visit (HOSPITAL_COMMUNITY)
Admission: RE | Admit: 2021-06-02 | Discharge: 2021-06-02 | Disposition: A | Discharge status: Home / Self Care | Payer: 59 | Source: Ambulatory Visit | Attending: Family Medicine | Admitting: Family Medicine

## 2021-06-02 ENCOUNTER — Ambulatory Visit: Payer: Self-pay

## 2021-06-02 ENCOUNTER — Ambulatory Visit (INDEPENDENT_AMBULATORY_CARE_PROVIDER_SITE_OTHER): Payer: 59 | Admitting: Family Medicine

## 2021-06-02 ENCOUNTER — Other Ambulatory Visit: Payer: Self-pay

## 2021-06-02 VITALS — BP 142/74 | HR 79 | Resp 16 | Wt 154.8 lb

## 2021-06-02 DIAGNOSIS — R87610 Atypical squamous cells of undetermined significance on cytologic smear of cervix (ASC-US): Secondary | ICD-10-CM

## 2021-06-02 DIAGNOSIS — N950 Postmenopausal bleeding: Secondary | ICD-10-CM

## 2021-06-02 DIAGNOSIS — Z8742 Personal history of other diseases of the female genital tract: Secondary | ICD-10-CM | POA: Diagnosis not present

## 2021-06-02 NOTE — Telephone Encounter (Signed)
Chief Complaint: Vaginal bleeding Symptoms: Mild abdominal pain Frequency: Started this morning when wiped Pertinent Negatives: Patient denies other symptoms Disposition: [] ED /[] Urgent Care (no appt availability in office) / [x] Appointment(In office/virtual)/ []  [facility]/ [] Home Care/ [] Refused Recommended Disposition /[] [place] [facility]/ []  Follow-up with PCP Additional Notes: N/A    Reason for Disposition  Postmenopausal vaginal bleeding  Answer Assessment - Initial Assessment Questions 1. AMOUNT: "Describe the bleeding that you are having." "How much bleeding is there?"    - SPOTTING: spotting, or pinkish / brownish mucous discharge; does not fill panty liner or pad    - MILD:  less than 1 pad / hour; less than patient's usual menstrual bleeding   - MODERATE: 1-2 pads / hour; 1 menstrual cup every 6 hours; small-medium blood clots (e.g., pea, grape, small coin)   - SEVERE: soaking 2 or more pads/hour for 2 or more hours; 1 menstrual cup every 2 hours; bleeding not contained by pads or continuous red blood from vagina; large blood clots (e.g., golf ball, large coin)      Mild 2. ONSET: "When did the bleeding begin?" "Is it continuing now?"     Today, yes this morning when I wiped I saw it 3. MENOPAUSE: "When was your last menstrual period?"      Menopause 4. ABDOMINAL PAIN: "Do you have any pain?" "How bad is the pain?"  (e.g., Scale 1-10; mild, moderate, or severe)   - MILD (1-3): doesn't interfere with normal activities, abdomen soft and not tender to touch    - MODERATE (4-7): interferes with normal activities or awakens from sleep, abdomen tender to touch    - SEVERE (8-10): excruciating pain, doubled over, unable to do any normal activities      A little this morning 5. BLOOD THINNERS: "Do you take any blood thinners?" (e.g., Coumadin/warfarin, Pradaxa/dabigatran, aspirin)     No 6. HORMONES: "Are you taking any hormone medications, prescription or OTC?"  (e.g., birth control pills, estrogen)     No 7. CAUSE: "What do you think is causing the bleeding?" (e.g., recent gyn surgery, recent gyn procedure; known bleeding disorder, uterine cancer)       Unknown 8. HEMODYNAMIC STATUS: "Are you weak or feeling lightheaded?" If Yes, ask: "Can you stand and walk normally?"       N/A 9. OTHER SYMPTOMS: "What other symptoms are you having with the bleeding?" (e.g., back pain, burning with urination, fever)     No  Protocols used: Vaginal Bleeding - Postmenopausal-A-AH

## 2021-06-02 NOTE — Assessment & Plan Note (Signed)
Recurrent problem In 2019 - US showed ES 31mm and EMB benign Needs repeat eval - pelvic US and GYN referral placed for further eval May need D&C per last GYN note

## 2021-06-02 NOTE — Assessment & Plan Note (Signed)
Repeat pap smear today

## 2021-06-02 NOTE — Progress Notes (Signed)
Established patient visit   Patient: Gina [name]   DOB: 1956-02-21   66 y.o. Female  MRN: [id number] Visit Date: 06/02/2021  Today's healthcare provider: Lavon Paganini, MD    I,Joseline E Rosas,acting as a scribe for Lavon Paganini, MD.,have documented all relevant documentation on the behalf of Lavon Paganini, MD,as directed by  Lavon Paganini, MD while in the presence of Lavon Paganini, MD.   Chief Complaint  Patient presents with   Vaginal Bleeding   Subjective    Vaginal Bleeding The patient's primary symptoms include vaginal bleeding. This is a new problem. The current episode started today. The problem occurs constantly. The problem has been unchanged. The patient is experiencing no pain. She is not pregnant. Pertinent negatives include no back pain. Abdominal pain: "some cramping".Vaginal bleeding amount: like a first day of a menstrual cycle. Nothing aggravates the symptoms. She is postmenopausal.   Patient reports that she started bleeding since this morning.   Had this previously in 2019 and saw Dr Kenton Kingfisher at [facility]. US showed ES 46mm, but EMB benign.  Medications: Outpatient Medications Prior to Visit  Medication Sig   albuterol (VENTOLIN HFA) 108 (90 Base) MCG/ACT inhaler TAKE 2 PUFFS BY MOUTH EVERY 6 HOURS AS NEEDED FOR WHEEZE OR SHORTNESS OF BREATH (Patient taking differently: as needed.)   ALPRAZolam (XANAX) 0.5 MG tablet TAKE 1 TABLET BY MOUTH AT BEDTIME AS NEEDED FOR ANXIETY   amLODipine (NORVASC) 5 MG tablet Take 1 tablet (5 mg total) by mouth at bedtime.   calcipotriene (DOVONOX) 0.005 % cream Apply topically daily. To psoriasis as needed 7 days a week   celecoxib (CELEBREX) 100 MG capsule Take 1 capsule (100 mg total) by mouth 2 (two) times daily.   cetirizine (ZYRTEC) 10 MG tablet Take 10 mg by mouth See admin instructions. Take 10 mg by mouth every other night   Cholecalciferol (VITAMIN D3) 1000 UNITS CAPS Take 1,000 Units by  mouth at bedtime.    estradiol (ESTRACE VAGINAL) 0.1 MG/GM vaginal cream Apply 1 dab around the urethra opening daily at bedtime. (Patient taking differently: Apply 1 dab around the urethra opening daily at bedtime. HAS NOT STARTED.)   fluconazole (DIFLUCAN) 200 MG tablet TAKE 1 TABLET (200 MG TOTAL) BY MOUTH AS DIRECTED. TAKE 1 TABLET BY MOUTH. REPEAT IF NEEDED.   fluticasone (FLONASE) 50 MCG/ACT nasal spray SPRAY 2 SPRAYS INTO EACH NOSTRIL EVERY DAY   ketoconazole (NIZORAL) 2 % cream APPLY UNDER THE BREAST DAILY   levothyroxine (SYNTHROID) 25 MCG tablet TAKE 1 TABLET BY MOUTH DAILY BEFORE BREAKFAST.   OTEZLA 30 MG TABS TAKE 1 TABLET (30MG  TOTAL) BY MOUTH TWICE A DAY   pantoprazole (PROTONIX) 40 MG tablet Take 1 tablet (40 mg total) by mouth daily.   traZODone (DESYREL) 50 MG tablet TAKE 0.5-1 TABLETS BY MOUTH AT BEDTIME AS NEEDED FOR SLEEP.   No facility-administered medications prior to visit.    Review of Systems  Gastrointestinal:  Abdominal pain: "some cramping".  Genitourinary:  Positive for vaginal bleeding.  Musculoskeletal:  Negative for back pain.      Objective    BP (!) 142/74 (BP Location: Right Arm, Patient Position: Sitting, Cuff Size: Large)    Pulse 79    Resp 16    Wt 154 lb 12.8 oz (70.2 kg)    BMI 27.42 kg/m  {Show previous vital signs (optional):23777}  Physical Exam Vitals reviewed.  Constitutional:      General: She is not in  acute distress.    Appearance: She is well-developed.  HENT:     Head: Normocephalic and atraumatic.  Eyes:     General: No scleral icterus.    Conjunctiva/sclera: Conjunctivae normal.  Cardiovascular:     Rate and Rhythm: Normal rate and regular rhythm.  Pulmonary:     Effort: Pulmonary effort is normal. No respiratory distress.  Abdominal:     General: There is no distension.     Palpations: Abdomen is soft.     Tenderness: There is no abdominal tenderness.  Genitourinary:    Comments: GYN:  External genitalia within normal  limits.  Vaginal mucosa pink, moist, normal rugae.  Nonfriable cervix without lesions, no discharge, + bleeding noted on speculum exam.   Skin:    General: Skin is warm and dry.     Findings: No rash.  Neurological:     Mental Status: She is alert and oriented to person, place, and time.  Psychiatric:        Behavior: Behavior normal.      No results found for any visits on 06/02/21.  Assessment & Plan     Problem List Items Addressed This Visit       Other   ASCUS of cervix with negative high risk HPV    Repeat pap smear today      Postmenopausal bleeding - Primary    Recurrent problem In 2019 - US showed ES 70mm and EMB benign Needs repeat eval - pelvic US and GYN referral placed for further eval May need D&C per last GYN note      Relevant Orders   Ambulatory referral to Gynecology   US Pelvic Complete With Transvaginal   Other Visit Diagnoses     History of abnormal cervical Pap smear       Relevant Orders   Cytology - PAP        Return in about 2 months (around 07/31/2021) for as scheduled.      I, Lavon Paganini, MD, have reviewed all documentation for this visit. The documentation on 06/02/21 for the exam, diagnosis, procedures, and orders are all accurate and complete.   Krystofer Hevener, Dionne Bucy, MD, MPH [facility]

## 2021-06-06 ENCOUNTER — Other Ambulatory Visit: Payer: Self-pay

## 2021-06-06 DIAGNOSIS — L409 Psoriasis, unspecified: Secondary | ICD-10-CM

## 2021-06-06 LAB — CYTOLOGY - PAP
Comment: NEGATIVE
Diagnosis: NEGATIVE
High risk HPV: NEGATIVE

## 2021-06-06 MED ORDER — OTEZLA 30 MG PO TABS: ORAL_TABLET | ORAL | 0 refills | Status: DC

## 2021-06-06 NOTE — Progress Notes (Signed)
New pharmacy requested

## 2021-06-07 ENCOUNTER — Ambulatory Visit
Admission: RE | Admit: 2021-06-07 | Discharge: 2021-06-07 | Disposition: A | Discharge status: Home / Self Care | Payer: 59 | Source: Ambulatory Visit | Attending: Family Medicine | Admitting: Family Medicine

## 2021-06-07 DIAGNOSIS — N950 Postmenopausal bleeding: Secondary | ICD-10-CM

## 2021-06-08 ENCOUNTER — Telehealth: Payer: Self-pay

## 2021-06-08 NOTE — Telephone Encounter (Signed)
Patient was asking about her US Pelvic results. Advised patient that I was able to see the reports but the provider needed to sign the report. That we will call her tomorrow with results.

## 2021-06-08 NOTE — Telephone Encounter (Signed)
Copied from [place] [id number]. Topic: General - Other >> Jun 08, 2021  4:30 PM Gina [name] A wrote: Reason for CRM: The patient would like to speak with a member of clinical staff when possible about their recent pelvic testing   Please contact further when available

## 2021-06-09 ENCOUNTER — Ambulatory Visit: Payer: Self-pay

## 2021-06-09 NOTE — Telephone Encounter (Signed)
See result note.  

## 2021-06-13 DIAGNOSIS — N950 Postmenopausal bleeding: Secondary | ICD-10-CM | POA: Diagnosis not present

## 2021-06-15 DIAGNOSIS — R9389 Abnormal findings on diagnostic imaging of other specified body structures: Secondary | ICD-10-CM | POA: Diagnosis present
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
## 2021-06-16 ENCOUNTER — Other Ambulatory Visit: Payer: Self-pay | Admitting: Obstetrics and Gynecology

## 2021-06-20 ENCOUNTER — Encounter
Admission: RE | Admit: 2021-06-20 | Discharge: 2021-06-20 | Disposition: A | Discharge status: Home / Self Care | Payer: 59 | Source: Ambulatory Visit | Attending: Obstetrics and Gynecology | Admitting: Obstetrics and Gynecology

## 2021-06-20 ENCOUNTER — Other Ambulatory Visit: Payer: Self-pay

## 2021-06-20 DIAGNOSIS — I2510 Atherosclerotic heart disease of native coronary artery without angina pectoris: Secondary | ICD-10-CM

## 2021-06-20 HISTORY — DX: Personal history of urinary calculi: Z87.442

## 2021-06-20 HISTORY — DX: Essential (primary) hypertension: I10

## 2021-06-20 HISTORY — DX: Unspecified osteoarthritis, unspecified site: M19.90

## 2021-06-20 HISTORY — DX: Hypothyroidism, unspecified: E03.9

## 2021-06-20 HISTORY — DX: Bronchitis, not specified as acute or chronic: J40

## 2021-06-20 HISTORY — DX: Gastro-esophageal reflux disease without esophagitis: K21.9

## 2021-06-20 NOTE — Patient Instructions (Signed)
Your procedure is scheduled on:06-21-21 Tuesday Report to the Registration Desk on the 1st floor of the [facility].Then proceed to the 2nd floor Surgery Desk in the [facility] To find out your arrival time, please call [phone] between 1PM - 3PM on:06-20-21 Monday  REMEMBER: Instructions that are not followed completely may result in serious medical risk, up to and including death; or upon the discretion of your surgeon and anesthesiologist your surgery may need to be rescheduled.  Do not eat food after midnight the night before surgery.  No gum chewing, lozengers or hard candies.  You may however, drink CLEAR liquids up to 2 hours before you are scheduled to arrive for your surgery. Do not drink anything within 2 hours of your scheduled arrival time.  Clear liquids include: - water  - apple juice without pulp - gatorade (not RED, PURPLE, OR BLUE) - black coffee or tea (Do NOT add milk or creamers to the coffee or tea) Do NOT drink anything that is not on this list.  TAKE THESE MEDICATIONS THE MORNING OF SURGERY WITH A SIP OF WATER: -levothyroxine (SYNTHROID) -pantoprazole (PROTONIX)  Bring your albuterol (VENTOLIN HFA) 108 (90 Base) MCG/ACT inhaler to the hospital the day of surgery  One week prior to surgery: Stop Anti-inflammatories (NSAIDS) such as Advil, Aleve, Ibuprofen, Motrin, Naproxen, Naprosyn and Aspirin based products such as Excedrin, Goodys Powder, BC Powder.You may however, take Tylenol if needed for pain up until the day of surgery.  No Alcohol for 24 hours before or after surgery.  No Smoking including e-cigarettes for 24 hours prior to surgery.  No chewable tobacco products for at least 6 hours prior to surgery.  No nicotine patches on the day of surgery.  Do not use any "recreational" drugs for at least a week prior to your surgery.  Please be advised that the combination of cocaine and anesthesia may have negative outcomes, up to and including death. If  you test positive for cocaine, your surgery will be cancelled.  On the morning of surgery brush your teeth with toothpaste and water, you may rinse your mouth with mouthwash if you wish. Do not swallow any toothpaste or mouthwash.  Do not wear jewelry, make-up, hairpins, clips or nail polish.  Do not wear lotions, powders, or perfumes.   Do not shave body from the neck down 48 hours prior to surgery just in case you cut yourself which could leave a site for infection.  Also, freshly shaved skin may become irritated if using the CHG soap.  Contact lenses, hearing aids and dentures may not be worn into surgery.  Do not bring valuables to the hospital. [facility] is not responsible for any missing/lost belongings or valuables.   Notify your doctor if there is any change in your medical condition (cold, fever, infection).  Wear comfortable clothing (specific to your surgery type) to the hospital.  After surgery, you can help prevent lung complications by doing breathing exercises.  Take deep breaths and cough every 1-2 hours. Your doctor may order a device called an Incentive Spirometer to help you take deep breaths. When coughing or sneezing, hold a pillow firmly against your incision with both hands. This is called splinting. Doing this helps protect your incision. It also decreases belly discomfort.  If you are being admitted to the hospital overnight, leave your suitcase in the car. After surgery it may be brought to your room.  If you are being discharged the day of surgery, you will  not be allowed to drive home. You will need a responsible adult (18 years or older) to drive you home and stay with you that night.   If you are taking public transportation, you will need to have a responsible adult (18 years or older) with you. Please confirm with your physician that it is acceptable to use public transportation.   Please call the [facility] at [phone] if  you have any questions about these instructions.  Surgery Visitation Policy:  Patients undergoing a surgery or procedure may have one family member or support person with them as long as that person is not COVID-19 positive or experiencing its symptoms.  That person may remain in the waiting area during the procedure and may rotate out with other people.  Inpatient Visitation:    Visiting hours are 7 a.m. to 8 p.m. Up to two visitors ages 16+ are allowed at one time in a patient room. The visitors may rotate out with other people during the day. Visitors must check out when they leave, or other visitors will not be allowed. One designated support person may remain overnight. The visitor must pass COVID-19 screenings, use hand sanitizer when entering and exiting the patients room and wear a mask at all times, including in the patients room. Patients must also wear a mask when staff or their visitor are in the room. Masking is required regardless of vaccination status.

## 2021-06-21 ENCOUNTER — Ambulatory Visit: Payer: 59 | Admitting: Certified Registered Nurse Anesthetist

## 2021-06-21 ENCOUNTER — Ambulatory Visit
Admission: RE | Admit: 2021-06-21 | Discharge: 2021-06-21 | Disposition: A | Discharge status: Home / Self Care | Payer: 59 | Attending: Obstetrics and Gynecology | Admitting: Obstetrics and Gynecology

## 2021-06-21 ENCOUNTER — Other Ambulatory Visit: Payer: Self-pay

## 2021-06-21 ENCOUNTER — Encounter
Admission: RE | Disposition: A | Discharge status: Home / Self Care | Payer: Self-pay | Source: Home / Self Care | Attending: Obstetrics and Gynecology

## 2021-06-21 DIAGNOSIS — R9389 Abnormal findings on diagnostic imaging of other specified body structures: Secondary | ICD-10-CM | POA: Diagnosis not present

## 2021-06-21 DIAGNOSIS — L409 Psoriasis, unspecified: Secondary | ICD-10-CM

## 2021-06-21 DIAGNOSIS — Z791 Long term (current) use of non-steroidal anti-inflammatories (NSAID): Secondary | ICD-10-CM | POA: Insufficient documentation

## 2021-06-21 DIAGNOSIS — N840 Polyp of corpus uteri: Secondary | ICD-10-CM | POA: Diagnosis not present

## 2021-06-21 DIAGNOSIS — E039 Hypothyroidism, unspecified: Secondary | ICD-10-CM | POA: Diagnosis not present

## 2021-06-21 DIAGNOSIS — N950 Postmenopausal bleeding: Secondary | ICD-10-CM | POA: Diagnosis not present

## 2021-06-21 DIAGNOSIS — Z79899 Other long term (current) drug therapy: Secondary | ICD-10-CM | POA: Diagnosis not present

## 2021-06-21 DIAGNOSIS — Z79890 Hormone replacement therapy (postmenopausal): Secondary | ICD-10-CM | POA: Insufficient documentation

## 2021-06-21 DIAGNOSIS — J302 Other seasonal allergic rhinitis: Secondary | ICD-10-CM

## 2021-06-21 DIAGNOSIS — I10 Essential (primary) hypertension: Secondary | ICD-10-CM | POA: Insufficient documentation

## 2021-06-21 DIAGNOSIS — M1990 Unspecified osteoarthritis, unspecified site: Secondary | ICD-10-CM | POA: Insufficient documentation

## 2021-06-21 DIAGNOSIS — R1310 Dysphagia, unspecified: Secondary | ICD-10-CM

## 2021-06-21 DIAGNOSIS — L304 Erythema intertrigo: Secondary | ICD-10-CM

## 2021-06-21 DIAGNOSIS — I2510 Atherosclerotic heart disease of native coronary artery without angina pectoris: Secondary | ICD-10-CM

## 2021-06-21 HISTORY — PX: HYSTEROSCOPY WITH D & C: SHX1775

## 2021-06-21 LAB — CBC
HCT: 39.6 % (ref 36.0–46.0)
Hemoglobin: 12.6 g/dL (ref 12.0–15.0)
MCH: 29.4 pg (ref 26.0–34.0)
MCHC: 31.8 g/dL (ref 30.0–36.0)
MCV: 92.5 fL (ref 80.0–100.0)
Platelets: 248 K/uL (ref 150–400)
RBC: 4.28 MIL/uL (ref 3.87–5.11)
RDW: 13.7 % (ref 11.5–15.5)
WBC: 4.9 K/uL (ref 4.0–10.5)
nRBC: 0 % (ref 0.0–0.2)

## 2021-06-21 LAB — BASIC METABOLIC PANEL
Anion gap: 6 (ref 5–15)
BUN: 13 mg/dL (ref 8–23)
CO2: 27 mmol/L (ref 22–32)
Calcium: 8.8 mg/dL — ABNORMAL LOW (ref 8.9–10.3)
Chloride: 105 mmol/L (ref 98–111)
Creatinine, Ser: 0.7 mg/dL (ref 0.44–1.00)
GFR, Estimated: >60 mL/min (ref >60)
Glucose, Bld: 92 mg/dL (ref 70–99)
Potassium: 3.6 mmol/L (ref 3.5–5.1)
Sodium: 138 mmol/L (ref 135–145)

## 2021-06-21 SURGERY — DILATATION AND CURETTAGE /HYSTEROSCOPY
Anesthesia: General | Site: Uterus

## 2021-06-21 MED ORDER — ACETAMINOPHEN 10 MG/ML IV SOLN
INTRAVENOUS | Status: AC
  Filled 2021-06-21: qty 100

## 2021-06-21 MED ORDER — FENTANYL CITRATE (PF) 100 MCG/2ML IJ SOLN
INTRAMUSCULAR | Status: AC
  Filled 2021-06-21: qty 2

## 2021-06-21 MED ORDER — FENTANYL CITRATE (PF) 100 MCG/2ML IJ SOLN
INTRAMUSCULAR | Status: DC | PRN
  Administered 2021-06-21: 25 ug via INTRAVENOUS
  Administered 2021-06-21: 50 ug via INTRAVENOUS
  Administered 2021-06-21 (×2): 25 ug via INTRAVENOUS

## 2021-06-21 MED ORDER — FAMOTIDINE 20 MG PO TABS
ORAL_TABLET | ORAL | Status: AC
  Filled 2021-06-21: qty 1

## 2021-06-21 MED ORDER — ROCURONIUM BROMIDE 10 MG/ML (PF) SYRINGE
PREFILLED_SYRINGE | INTRAVENOUS | Status: AC
  Filled 2021-06-21: qty 10

## 2021-06-21 MED ORDER — SILVER NITRATE-POT NITRATE 75-25 % EX MISC
CUTANEOUS | Status: AC
  Filled 2021-06-21: qty 10

## 2021-06-21 MED ORDER — DEXAMETHASONE SODIUM PHOSPHATE 10 MG/ML IJ SOLN
INTRAMUSCULAR | Status: DC | PRN
  Administered 2021-06-21: 5 mg via INTRAVENOUS

## 2021-06-21 MED ORDER — CHLORHEXIDINE GLUCONATE 0.12 % MT SOLN: 15.0000 mL | Freq: Once | OROMUCOSAL | Status: AC

## 2021-06-21 MED ORDER — KETOROLAC TROMETHAMINE 30 MG/ML IJ SOLN
INTRAMUSCULAR | Status: AC
  Filled 2021-06-21: qty 1

## 2021-06-21 MED ORDER — ONDANSETRON HCL 4 MG/2ML IJ SOLN
INTRAMUSCULAR | Status: AC
  Filled 2021-06-21: qty 4

## 2021-06-21 MED ORDER — ACETAMINOPHEN 10 MG/ML IV SOLN: 1000.0000 mg | Freq: Once | INTRAVENOUS | Status: DC | PRN

## 2021-06-21 MED ORDER — OXYCODONE HCL 5 MG PO TABS: 5.0000 mg | ORAL_TABLET | Freq: Once | ORAL | Status: DC | PRN

## 2021-06-21 MED ORDER — IBUPROFEN 600 MG PO TABS: 600.0000 mg | ORAL_TABLET | Freq: Four times a day (QID) | ORAL | 0 refills | Status: DC | PRN

## 2021-06-21 MED ORDER — KETOROLAC TROMETHAMINE 30 MG/ML IJ SOLN
INTRAMUSCULAR | Status: DC | PRN
  Administered 2021-06-21: 15 mg via INTRAVENOUS

## 2021-06-21 MED ORDER — LACTATED RINGERS IV SOLN: INTRAVENOUS | Status: DC

## 2021-06-21 MED ORDER — ORAL CARE MOUTH RINSE: 15.0000 mL | Freq: Once | OROMUCOSAL | Status: AC

## 2021-06-21 MED ORDER — PROPOFOL 10 MG/ML IV BOLUS
INTRAVENOUS | Status: DC | PRN
  Administered 2021-06-21: 120 mg via INTRAVENOUS

## 2021-06-21 MED ORDER — MIDAZOLAM HCL 2 MG/2ML IJ SOLN
INTRAMUSCULAR | Status: AC
  Filled 2021-06-21: qty 2

## 2021-06-21 MED ORDER — ONDANSETRON HCL 4 MG/2ML IJ SOLN
INTRAMUSCULAR | Status: DC | PRN
  Administered 2021-06-21: 4 mg via INTRAVENOUS

## 2021-06-21 MED ORDER — CHLORHEXIDINE GLUCONATE 0.12 % MT SOLN
OROMUCOSAL | Status: AC
  Administered 2021-06-21: 15 mL via OROMUCOSAL
  Filled 2021-06-21: qty 15

## 2021-06-21 MED ORDER — ACETAMINOPHEN 10 MG/ML IV SOLN
INTRAVENOUS | Status: DC | PRN
  Administered 2021-06-21: 1000 mg via INTRAVENOUS

## 2021-06-21 MED ORDER — FENTANYL CITRATE (PF) 100 MCG/2ML IJ SOLN: 25.0000 ug | INTRAMUSCULAR | Status: DC | PRN

## 2021-06-21 MED ORDER — ONDANSETRON HCL 4 MG/2ML IJ SOLN: 4.0000 mg | Freq: Once | INTRAMUSCULAR | Status: DC | PRN

## 2021-06-21 MED ORDER — LIDOCAINE HCL (CARDIAC) PF 100 MG/5ML IV SOSY
PREFILLED_SYRINGE | INTRAVENOUS | Status: DC | PRN
  Administered 2021-06-21: 80 mg via INTRAVENOUS

## 2021-06-21 MED ORDER — OXYCODONE HCL 5 MG/5ML PO SOLN: 5.0000 mg | Freq: Once | ORAL | Status: DC | PRN

## 2021-06-21 MED ORDER — GLYCOPYRROLATE 0.2 MG/ML IJ SOLN
INTRAMUSCULAR | Status: AC
  Filled 2021-06-21: qty 1

## 2021-06-21 MED ORDER — LIDOCAINE HCL (PF) 2 % IJ SOLN
INTRAMUSCULAR | Status: AC
  Filled 2021-06-21: qty 5

## 2021-06-21 MED ORDER — PROPOFOL 10 MG/ML IV BOLUS
INTRAVENOUS | Status: AC
  Filled 2021-06-21: qty 20

## 2021-06-21 MED ORDER — DEXAMETHASONE SODIUM PHOSPHATE 10 MG/ML IJ SOLN
INTRAMUSCULAR | Status: AC
  Filled 2021-06-21: qty 2

## 2021-06-21 SURGICAL SUPPLY — 31 items
BACTOSHIELD CHG 4% 4OZ (MISCELLANEOUS) ×1
BAG DRN RND TRDRP ANRFLXCHMBR (UROLOGICAL SUPPLIES)
BAG URINE DRAIN 2000ML AR STRL (UROLOGICAL SUPPLIES) IMPLANT
CATH FOLEY 2WAY  5CC 16FR (CATHETERS) ×2
CATH FOLEY 2WAY 5CC 16FR (CATHETERS) ×1
CATH ROBINSON RED A/P 14FR (CATHETERS) ×1 IMPLANT
CATH URTH 16FR FL 2W BLN LF (CATHETERS) IMPLANT
DEVICE MYOSURE LITE (MISCELLANEOUS) ×3 IMPLANT
DEVICE MYOSURE REACH (MISCELLANEOUS) ×3 IMPLANT
DRSG TELFA 3X8 NADH (GAUZE/BANDAGES/DRESSINGS) ×2 IMPLANT
ELECT REM PT RETURN 9FT ADLT (ELECTROSURGICAL) ×2
ELECTRODE REM PT RTRN 9FT ADLT (ELECTROSURGICAL) ×2 IMPLANT
GAUZE 4X4 16PLY ~~LOC~~+RFID DBL (SPONGE) ×3 IMPLANT
GLOVE SURG ENC MOIS LTX SZ7 (GLOVE) ×3 IMPLANT
GLOVE SURG UNDER POLY LF SZ7.5 (GLOVE) ×3 IMPLANT
GOWN STRL REUS W/ TWL LRG LVL3 (GOWN DISPOSABLE) ×4 IMPLANT
GOWN STRL REUS W/TWL LRG LVL3 (GOWN DISPOSABLE) ×4
IV NS IRRIG 3000ML ARTHROMATIC (IV SOLUTION) ×3 IMPLANT
KIT PROCEDURE FLUENT (KITS) ×3 IMPLANT
KIT TURNOVER CYSTO (KITS) ×3 IMPLANT
MANIFOLD NEPTUNE II (INSTRUMENTS) ×3 IMPLANT
PACK DNC HYST (MISCELLANEOUS) ×3 IMPLANT
PAD DRESSING TELFA 3X8 NADH (GAUZE/BANDAGES/DRESSINGS) IMPLANT
PAD OB MATERNITY 4.3X12.25 (PERSONAL CARE ITEMS) ×3 IMPLANT
PAD PREP 24X41 OB/GYN DISP (PERSONAL CARE ITEMS) ×3 IMPLANT
SCRUB CHG 4% DYNA-HEX 4OZ (MISCELLANEOUS) ×2 IMPLANT
SEAL ROD LENS SCOPE MYOSURE (ABLATOR) ×3 IMPLANT
SET CYSTO W/LG BORE CLAMP LF (SET/KITS/TRAYS/PACK) IMPLANT
SURGILUBE 2OZ TUBE FLIPTOP (MISCELLANEOUS) ×3 IMPLANT
TUBING CONNECTING 10 (TUBING) ×3 IMPLANT
WATER STERILE IRR 500ML POUR (IV SOLUTION) ×3 IMPLANT

## 2021-06-21 NOTE — Anesthesia Procedure Notes (Signed)
Procedure Name: LMA Insertion Date/Time: 06/21/2021 1:52 PM Performed by: Loletha Grayer, CRNA Pre-anesthesia Checklist: Patient identified, Patient being monitored, Timeout performed, Emergency Drugs available and Suction available Patient Re-evaluated:Patient Re-evaluated prior to induction Oxygen Delivery Method: Circle system utilized Preoxygenation: Pre-oxygenation with 100% oxygen Induction Type: IV induction Ventilation: Mask ventilation without difficulty LMA: LMA inserted LMA Size: 3.0 Number of attempts: 1 Placement Confirmation: positive ETCO2 Tube secured with: Tape Dental Injury: Teeth and Oropharynx as per pre-operative assessment

## 2021-06-21 NOTE — Discharge Instructions (Signed)
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
## 2021-06-21 NOTE — Transfer of Care (Signed)
Immediate Anesthesia Transfer of Care Note  Patient: Gina [name]  Procedure(s) Performed: DILATATION AND CURETTAGE /HYSTEROSCOPY, REMOVAL OF ABNORMAL TISSUE  Patient Location: PACU  Anesthesia Type:General  Level of Consciousness: sedated  Airway & Oxygen Therapy: Patient Spontanous Breathing and Patient connected to face mask oxygen  Post-op Assessment: Report given to RN and Post -op Vital signs reviewed and stable  Post vital signs: Reviewed and stable  Last Vitals:  Vitals Value Taken Time  BP 121/80 06/21/21 1526  Temp 36 C 06/21/21 1526  Pulse 64 06/21/21 1527  Resp 10 06/21/21 1527  SpO2 97 % 06/21/21 1527  Vitals shown include unvalidated device data.  Last Pain:  Vitals:   06/21/21 1526  TempSrc:   PainSc: Asleep         Complications: No notable events documented.

## 2021-06-21 NOTE — Op Note (Signed)
Operative Note   Name: Gina [name]  Date of Service: 06/21/2021 MRN: [id number]   PRE-OP DIAGNOSIS:  1) Postmenopausal Bleeding [N95.0] 2)  Thickened endometrium [R93.89]   POST-OP DIAGNOSIS:  1) Postmenopausal Bleeding [N95.0] 2)  Thickened endometrium [R93.89] 3) Endometrial polyp [N84.0]  SURGEON: Surgeon(s) and Role:    Will Bonnet, MD - Primary  PROCEDURE: Procedure(s): DILATATION AND CURETTAGE /HYSTEROSCOPY, REMOVAL OF ABNORMAL TISSUE   ANESTHESIA: General   ESTIMATED BLOOD LOSS: 25 mL  DRAINS: none   TOTAL IV FLUIDS: 400 mL  SPECIMENS:  1) Endometrial polypoid mass 2) Endometrial curettings  VTE PROPHYLAXIS: SCDs to the bilateral lower extremities  ANTIBIOTICS: none indicated  FLUID DEFICIT: 349 mL  COMPLICATIONS: none  DISPOSITION: PACU - hemodynamically stable.  CONDITION: stable  INDICATION: 66 y.o. [id number] female with postmenopausal bleeding and endometrial stripe of 28 mm.  This was evaluated 2 years ago in the clinic setting with a different provider with a pipelle endometrial biopsy with normal findings. She had no bleeding for nearly two years and then started bleeding again. With an increase in endometrial thickness on ultrasound the decision was made to proceed for definitive diagnosis.   FINDINGS: Exam under anesthesia revealed small, mobile anteverted uterus with no masses and bilateral adnexa without masses or fullness. Hysteroscopy revealed a two, broad-based polypoid lesions, at least. These lesions originated on the patient's left lateral and anterior walls and extended from the fundus to nearly the internal cervical os.    PROCEDURE IN DETAIL:  After informed consent was obtained, the patient was taken to the operating room where anesthesia was obtained without difficulty. The patient was positioned in the dorsal lithotomy position in candy cane stirrups.  The patient's bladder was catheterized with an in and out foley catheter.   The patient was examined under anesthesia, with the above noted findings.  The bi-valved speculum was placed inside the patient's vagina, and the the anterior lip of the cervix was seen and grasped with the tenaculum.  The cervix was progressively dilated to a 7 Hegar dilator.  The hysteroscope was introduced, with the above noted findings. The MyoSure Reach was used to resect the polypoid lesions until the contour of the uterine cavity was nearly flush.  The hysteroscope was removed and a curettage was performed until a gritty texture was noted.  The hysteroscope was re-introduced with hemostasis obtained using intrauterine pressure tamponade.    The hystersocope was removed.  The cervix was monitord for 5 minutes with gentle massage on the fundus of the uterus to verify no excessive bleeding occurred.  This was found to be satisfactory with minimal discharge of a small amount of pink fluid from the cervix.  The tenaculum was removed and hemostasis was noted at the tenaculum entry sites.  The vagina was inspected and found to be free of instruments and sponges.    She was then taken out of dorsal lithotomy. The patient tolerated the procedure well.  Sponge, lap and needle counts were correct x2.  The patient was taken to recovery room in excellent condition.  Will Bonnet, MD, [facility] 06/21/2021 3:18 PM

## 2021-06-21 NOTE — Anesthesia Postprocedure Evaluation (Signed)
Anesthesia Post Note  Patient: Gina [name]  Procedure(s) Performed: DILATATION AND CURETTAGE, HYSTEROSCOPY, ENDOMETRIAL POLYPECTOMY (Uterus)  Patient location during evaluation: PACU Anesthesia Type: General Level of consciousness: awake and alert Pain management: pain level controlled Vital Signs Assessment: post-procedure vital signs reviewed and stable Respiratory status: spontaneous breathing, nonlabored ventilation, respiratory function stable and patient connected to nasal cannula oxygen Cardiovascular status: blood pressure returned to baseline and stable Postop Assessment: no apparent nausea or vomiting Anesthetic complications: no   No notable events documented.   Last Vitals:  Vitals:   06/21/21 1545 06/21/21 1556  BP: (!) 159/81   Pulse: 77 77  Resp: 12 19  Temp:  (!) 36.3 C  SpO2: 98% 100%    Last Pain:  Vitals:   06/21/21 1556  TempSrc:   PainSc: 0-No pain                 Arita Miss

## 2021-06-21 NOTE — Interval H&P Note (Signed)
History and Physical Interval Note:  06/21/2021 1:28 PM  Gina [name]  has presented today for surgery, with the diagnosis of post menopausal bleeding.  The various methods of treatment have been discussed with the patient and family. After consideration of risks, benefits and other options for treatment, the patient has consented to  Procedure(s): DILATATION AND CURETTAGE /HYSTEROSCOPY, REMOVAL OF ABNORMAL TISSUE (N/A) as a surgical intervention.  The patient's history has been reviewed, patient examined, no change in status, stable for surgery.  I have reviewed the patient's chart and labs.  Questions were answered to the patient's satisfaction.  Consents reviewed. Patient agrees to proceed.  Prentice Docker, MD, [facility] 06/21/2021 1:29 PM

## 2021-06-22 ENCOUNTER — Encounter: Payer: Self-pay | Admitting: Obstetrics and Gynecology

## 2021-06-23 LAB — SURGICAL PATHOLOGY

## 2021-07-04 ENCOUNTER — Encounter: Payer: 59 | Admitting: Obstetrics and Gynecology

## 2021-07-08 ENCOUNTER — Encounter: Payer: 59 | Admitting: Obstetrics and Gynecology

## 2021-07-13 ENCOUNTER — Other Ambulatory Visit: Payer: Self-pay

## 2021-07-13 ENCOUNTER — Ambulatory Visit: Payer: 59 | Admitting: Dermatology

## 2021-07-13 DIAGNOSIS — Z79899 Other long term (current) drug therapy: Secondary | ICD-10-CM

## 2021-07-13 DIAGNOSIS — L409 Psoriasis, unspecified: Secondary | ICD-10-CM

## 2021-07-13 DIAGNOSIS — L578 Other skin changes due to chronic exposure to nonionizing radiation: Secondary | ICD-10-CM | POA: Diagnosis not present

## 2021-07-13 DIAGNOSIS — L820 Inflamed seborrheic keratosis: Secondary | ICD-10-CM | POA: Diagnosis not present

## 2021-07-13 DIAGNOSIS — L570 Actinic keratosis: Secondary | ICD-10-CM | POA: Diagnosis not present

## 2021-07-13 NOTE — Patient Instructions (Signed)

## 2021-07-13 NOTE — Progress Notes (Signed)
Follow-Up Visit   Subjective  Gina [name] is a 66 y.o. female who presents for the following: Follow-up (Psoriasis follow up - Otezla 30 mg 1 po qd - she is doing well). The patient has spots, moles and lesions to be evaluated, some may be new or changing and the patient has concerns that these could be cancer.  The following portions of the chart were reviewed this encounter and updated as appropriate:   Tobacco  Allergies  Meds  Problems  Med Hx  Surg Hx  Fam Hx     Review of Systems:  No other skin or systemic complaints except as noted in HPI or Assessment and Plan.  Objective  Well appearing patient in no apparent distress; mood and affect are within normal limits.  A focused examination was performed including chest, back, legs. Relevant physical exam findings are noted in the Assessment and Plan.  Guttate patches of back  Rigth thigh x 2, left thigh x 1 (3) Erythematous stuck-on, waxy papule or plaque  Chest (3) Erythematous thin papules/macules with gritty scale.    Assessment & Plan  Psoriasis Chronic and persistent condition with duration or expected duration over one year. Condition is symptomatic/ bothersome to patient. Not currently at goal. Flared. Psoriasis is a chronic non-curable, but treatable genetic/hereditary disease that may have other systemic features affecting other organ systems such as joints (Psoriatic Arthritis). It is associated with an increased risk of inflammatory bowel disease, heart disease, non-alcoholic fatty liver disease, and depression.    Side effects of Otezla (apremilast) include diarrhea, nausea, headache, upper respiratory infection, depression, and weight decrease (5-10%). It should only be taken by pregnant women after a discussion regarding risks and benefits with their doctor. Goal is control of skin condition, not cure.  The use of [place] requires long term medication management, including periodic office visits. She is  not having any side effects at this time.  Continue Otezla 30 mg 1 po qd - advised her she may discontinue during the summer if she is staying controlled.  Calcipotriene cream to aa persistent psoriasis spots.  Related Medications Apremilast (OTEZLA) 30 MG TABS Take 1 tablet (30 mg total) by mouth at bedtime.  Inflamed seborrheic keratosis (3) Rigth thigh x 2, left thigh x 1 Destruction of lesion - Rigth thigh x 2, left thigh x 1 Complexity: simple   Destruction method: cryotherapy   Informed consent: discussed and consent obtained   Timeout:  patient name, date of birth, surgical site, and procedure verified Lesion destroyed using liquid nitrogen: Yes   Region frozen until ice ball extended beyond lesion: Yes   Outcome: patient tolerated procedure well with no complications   Post-procedure details: wound care instructions given    AK (actinic keratosis) (3) Chest Destruction of lesion - Chest Complexity: simple   Destruction method: cryotherapy   Informed consent: discussed and consent obtained   Timeout:  patient name, date of birth, surgical site, and procedure verified Lesion destroyed using liquid nitrogen: Yes   Region frozen until ice ball extended beyond lesion: Yes   Outcome: patient tolerated procedure well with no complications   Post-procedure details: wound care instructions given    Actinic Damage - chronic, secondary to cumulative UV radiation exposure/sun exposure over time - diffuse scaly erythematous macules with underlying dyspigmentation - Recommend daily broad spectrum sunscreen SPF 30+ to sun-exposed areas, reapply every 2 hours as needed.  - Recommend staying in the shade or wearing long sleeves, sun glasses (UVA+UVB  protection) and wide brim hats (4-inch brim around the entire circumference of the hat). - Call for new or changing lesions.  Return in about 6 months (around 01/13/2022) for Psoriasis.  I, Joanie Coddington, CMA, am acting as scribe for Armida Sans, MD . Documentation: I have reviewed the above documentation for accuracy and completeness, and I agree with the above.  Armida Sans, MD

## 2021-07-19 ENCOUNTER — Encounter: Payer: Self-pay | Admitting: Dermatology
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
## 2021-08-04 ENCOUNTER — Encounter: Payer: Self-pay | Admitting: Family Medicine

## 2021-08-04 ENCOUNTER — Ambulatory Visit (INDEPENDENT_AMBULATORY_CARE_PROVIDER_SITE_OTHER): Payer: 59 | Admitting: Family Medicine

## 2021-08-04 ENCOUNTER — Telehealth: Payer: Self-pay

## 2021-08-04 VITALS — BP 139/90 | HR 73 | Temp 97.5°F | Resp 16 | Ht 62.0 in | Wt 154.1 lb

## 2021-08-04 DIAGNOSIS — I10 Essential (primary) hypertension: Secondary | ICD-10-CM

## 2021-08-04 DIAGNOSIS — Z0000 Encounter for general adult medical examination without abnormal findings: Secondary | ICD-10-CM

## 2021-08-04 DIAGNOSIS — Z1211 Encounter for screening for malignant neoplasm of colon: Secondary | ICD-10-CM

## 2021-08-04 DIAGNOSIS — B372 Candidiasis of skin and nail: Secondary | ICD-10-CM | POA: Diagnosis not present

## 2021-08-04 MED ORDER — AMLODIPINE BESYLATE 5 MG PO TABS: 5.0000 mg | ORAL_TABLET | Freq: Every day | ORAL | 1 refills | Status: DC

## 2021-08-04 MED ORDER — AMLODIPINE BESYLATE 2.5 MG PO TABS: 2.5000 mg | ORAL_TABLET | Freq: Every day | ORAL | 1 refills | Status: DC

## 2021-08-04 MED ORDER — NYSTATIN 100000 UNIT/GM EX POWD: 1.0000 "application " | Freq: Three times a day (TID) | CUTANEOUS | 0 refills | Status: DC

## 2021-08-04 NOTE — Assessment & Plan Note (Signed)
Due for repeat colon cancer screening ?Denies change in bowel habits; color, consistency, or character of stool ?

## 2021-08-04 NOTE — Assessment & Plan Note (Signed)
Chronic, borderline today 139/90; has been even higher at recent appts and specialists  ?Denies CP ?Denies SOB/ DOE ?Denies low blood pressure/hypotension ?Denies vision changes ?No LE Edema noted on exam ?Continue medication, Norvasc 5 mg; add additional 2.5 mg Norvasc ?Denies side effects ?RTC in 6 wks ?Seek emergent care if you develop chest pain or chest pressure ? ?

## 2021-08-04 NOTE — Telephone Encounter (Signed)
Called and spoke with patient put I a recall letter she wants [place] to call her in 3 months so put in a recall ?

## 2021-08-04 NOTE — Assessment & Plan Note (Signed)
UTD on dental ?Had eye exam last year ?Things to do to keep yourself healthy  ?- Exercise at least 30-45 minutes a day, 3-4 days a week.  ?- Eat a low-fat diet with lots of fruits and vegetables, up to 7-9 servings per day.  ?- Seatbelts can save your life. Wear them always.  ?- Smoke detectors on every level of your home, check batteries every year.  ?- Eye Doctor - have an eye exam every 1-2 years  ?- Safe sex - if you may be exposed to STDs, use a condom.  ?- Alcohol -  If you drink, do it moderately, less than 2 drinks per day.  ?- [place]. Choose someone to speak for you if you are not able.  ?- Depression is common in our stressful world.If you're feeling down or losing interest in things you normally enjoy, please come in for a visit.  ?- Violence - If anyone is threatening or hurting you, please call immediately. ? ? ?

## 2021-08-04 NOTE — Assessment & Plan Note (Signed)
Noted under bilateral breast tissue ?Recommend topical powder as first line ?RTC if not improved ?Reach out to derm for additional resources given hx of psoriasis  ?

## 2021-08-05 LAB — LIPID PANEL
Chol/HDL Ratio: 3.2 ratio (ref 0.0–4.4)
Cholesterol, Total: 194 mg/dL (ref 100–199)
HDL: 61 mg/dL (ref >39)
LDL Chol Calc (NIH): 110 mg/dL — ABNORMAL HIGH (ref 0–99)
Triglycerides: 131 mg/dL (ref 0–149)
VLDL Cholesterol Cal: 23 mg/dL (ref 5–40)

## 2021-08-05 LAB — TSH+FREE T4
Free T4: 1.39 ng/dL (ref 0.82–1.77)
TSH: 1.95 u[IU]/mL (ref 0.450–4.500)

## 2021-08-15 ENCOUNTER — Other Ambulatory Visit: Payer: Self-pay | Admitting: Podiatry

## 2021-08-16 ENCOUNTER — Other Ambulatory Visit: Payer: Self-pay | Admitting: Physician Assistant

## 2021-08-16 DIAGNOSIS — R1310 Dysphagia, unspecified: Secondary | ICD-10-CM

## 2021-08-16 DIAGNOSIS — N950 Postmenopausal bleeding: Secondary | ICD-10-CM

## 2021-08-16 DIAGNOSIS — N840 Polyp of corpus uteri: Secondary | ICD-10-CM

## 2021-08-16 DIAGNOSIS — R9389 Abnormal findings on diagnostic imaging of other specified body structures: Secondary | ICD-10-CM

## 2021-08-18 ENCOUNTER — Other Ambulatory Visit: Payer: Self-pay

## 2021-08-18 DIAGNOSIS — N840 Polyp of corpus uteri: Secondary | ICD-10-CM

## 2021-08-18 DIAGNOSIS — R9389 Abnormal findings on diagnostic imaging of other specified body structures: Secondary | ICD-10-CM

## 2021-08-18 DIAGNOSIS — L409 Psoriasis, unspecified: Secondary | ICD-10-CM

## 2021-08-18 DIAGNOSIS — N950 Postmenopausal bleeding: Secondary | ICD-10-CM

## 2021-08-18 MED ORDER — BRYHALI 0.01 % EX LOTN: 1.0000 "application " | TOPICAL_LOTION | Freq: Every day | CUTANEOUS | 0 refills | Status: DC

## 2021-08-18 MED ORDER — OTEZLA 30 MG PO TABS: 30.0000 mg | ORAL_TABLET | Freq: Every day | ORAL | 5 refills | Status: DC

## 2021-08-18 NOTE — Progress Notes (Signed)
R[place]ford Nail sent to a Optician, dispensing.  ?PA approval. Aw ? ?Patient also asked for RF of Bryhali Cream due to flare. 1 RX sent in to [facility]. aw ?

## 2021-08-30 ENCOUNTER — Other Ambulatory Visit: Payer: Self-pay | Admitting: Dermatology

## 2021-08-30 ENCOUNTER — Other Ambulatory Visit: Payer: Self-pay | Admitting: Family Medicine

## 2021-08-30 DIAGNOSIS — R9389 Abnormal findings on diagnostic imaging of other specified body structures: Secondary | ICD-10-CM

## 2021-08-30 DIAGNOSIS — L304 Erythema intertrigo: Secondary | ICD-10-CM

## 2021-08-30 DIAGNOSIS — N950 Postmenopausal bleeding: Secondary | ICD-10-CM

## 2021-08-30 DIAGNOSIS — N840 Polyp of corpus uteri: Secondary | ICD-10-CM

## 2021-08-30 DIAGNOSIS — F411 Generalized anxiety disorder: Secondary | ICD-10-CM

## 2021-09-14 ENCOUNTER — Ambulatory Visit (INDEPENDENT_AMBULATORY_CARE_PROVIDER_SITE_OTHER): Payer: 59 | Admitting: Family Medicine

## 2021-09-14 ENCOUNTER — Encounter: Payer: Self-pay | Admitting: Family Medicine

## 2021-09-14 VITALS — BP 139/77 | HR 68 | Temp 97.8°F | Resp 12 | Wt 148.0 lb

## 2021-09-14 DIAGNOSIS — J302 Other seasonal allergic rhinitis: Secondary | ICD-10-CM | POA: Diagnosis not present

## 2021-09-14 DIAGNOSIS — I10 Essential (primary) hypertension: Secondary | ICD-10-CM

## 2021-09-14 DIAGNOSIS — B372 Candidiasis of skin and nail: Secondary | ICD-10-CM

## 2021-09-14 MED ORDER — FLUTICASONE PROPIONATE 50 MCG/ACT NA SUSP: 2.0000 | Freq: Every day | NASAL | 11 refills | Status: DC | PRN

## 2021-09-14 MED ORDER — FLUCONAZOLE 50 MG PO TABS: 50.0000 mg | ORAL_TABLET | Freq: Every day | ORAL | 1 refills | Status: DC

## 2021-09-14 NOTE — Progress Notes (Signed)
?  ? ?I,Roshena L Chambers,acting as a scribe for Gwyneth Sprout, FNP.,have documented all relevant documentation on the behalf of Gwyneth Sprout, FNP,as directed by  Gwyneth Sprout, FNP while in the presence of Gwyneth Sprout, FNP.  ? ? ?Established patient visit ? ? ?Patient: Gina [name]   DOB: 25-Jun-1955   66 y.o. Female  MRN: [id number] ?Visit Date: 09/14/2021 ? ?Today's healthcare provider: Gwyneth Sprout, FNP  ?Re Introduced to nurse practitioner role and practice setting.  All questions answered.  Discussed provider/patient relationship and expectations. ? ? ?Chief Complaint  ?Patient presents with  ? Hypertension  ? ?Subjective  ?  ?HPI  ?Hypertension, follow-up ? ?BP Readings from Last 3 Encounters:  ?09/14/21 139/77  ?08/04/21 139/90  ?06/21/21 (!) 164/85  ? Wt Readings from Last 3 Encounters:  ?09/14/21 148 lb (67.1 kg)  ?08/04/21 154 lb 1.6 oz (69.9 kg)  ?06/21/21 150 lb (68 kg)  ?  ? ?She was last seen for hypertension on 08/04/2021.   ?BP at that visit was 139/90. Management since that visit includes continuing medication, Norvasc 5 mg; add additional 2.5 mg Norvasc ?RTC in 6 wks. ? ?She reports good compliance with treatment. ?She is not having side effects.  ?She is following a Regular diet. ?She is exercising. ?She does not smoke. ? ?Use of agents associated with hypertension: none.  ? ?Outside blood pressures are not checked. ?Symptoms: ?No chest pain No chest pressure  ?No palpitations No syncope  ?No dyspnea No orthopnea  ?No paroxysmal nocturnal dyspnea No lower extremity edema  ? ?Pertinent labs ?Lab Results  ?Component Value Date  ? CHOL 194 08/04/2021  ? HDL 61 08/04/2021  ? LDLCALC 110 (H) 08/04/2021  ? TRIG 131 08/04/2021  ? CHOLHDL 3.2 08/04/2021  ? Lab Results  ?Component Value Date  ? NA 138 06/21/2021  ? K 3.6 06/21/2021  ? CREATININE 0.70 06/21/2021  ? GFRNONAA >60 06/21/2021  ? GLUCOSE 92 06/21/2021  ? TSH 1.950 08/04/2021  ?  ? ?The 10-year ASCVD risk score (Arnett DK, et al., 2019)  is: 6.6% ? ?---------------------------------------------------------------------------------------------------  ? ?Medications: ?Outpatient Medications Prior to Visit  ?Medication Sig  ? ALPRAZolam (XANAX) 0.25 MG tablet Use 1-2 tablets by mouth, as needed, for anxiety. Do not mix with pain medication or alcohol due to risk of respiratory depression.  ? amLODipine (NORVASC) 2.5 MG tablet Take 1 tablet (2.5 mg total) by mouth daily. Take in addition to 5 mg dose, total of 7.5 mg.  ? amLODipine (NORVASC) 5 MG tablet Take 1 tablet (5 mg total) by mouth daily. Take in addition to 2.5 mg dose, total of 7.5 mg.  ? Apremilast (OTEZLA) 30 MG TABS Take 1 tablet (30 mg total) by mouth at bedtime.  ? calcipotriene (DOVONOX) 0.005 % cream Apply topically daily. To psoriasis as needed 7 days a week  ? celecoxib (CELEBREX) 100 MG capsule TAKE 1 CAPSULE BY MOUTH TWICE A DAY  ? cetirizine (ZYRTEC) 10 MG tablet Take 10 mg by mouth daily as needed for allergies.  ? Cholecalciferol (VITAMIN D3) 1000 UNITS CAPS Take 1,000 Units by mouth at bedtime.   ? Halobetasol Propionate (BRYHALI) 0.01 % LOTN Apply 1 application. topically daily. Up to 5 days per week per flare  ? ibuprofen (ADVIL) 600 MG tablet Take 1 tablet (600 mg total) by mouth every 6 (six) hours as needed.  ? ketoconazole (NIZORAL) 2 % cream APPLY UNDER THE BREAST DAILY  ? levothyroxine (SYNTHROID)  25 MCG tablet TAKE 1 TABLET BY MOUTH DAILY BEFORE BREAKFAST.  ? nystatin (MYCOSTATIN/NYSTOP) powder Apply 1 application. topically 3 (three) times daily.  ? pantoprazole (PROTONIX) 40 MG tablet TAKE 1 TABLET BY MOUTH EVERY DAY  ? [DISCONTINUED] fluticasone (FLONASE) 50 MCG/ACT nasal spray Place 2 sprays into both nostrils daily as needed for allergies.  ? traZODone (DESYREL) 50 MG tablet TAKE 0.5-1 TABLETS BY MOUTH AT BEDTIME AS NEEDED FOR SLEEP. (Patient not taking: Reported on 09/14/2021)  ? ?No facility-administered medications prior to visit.  ? ? ?Review of Systems   ?Constitutional:  Negative for appetite change, chills, fatigue and fever.  ?Respiratory:  Negative for chest tightness and shortness of breath.   ?Cardiovascular:  Negative for chest pain and palpitations.  ?Gastrointestinal:  Negative for abdominal pain, nausea and vomiting.  ?Neurological:  Negative for dizziness and weakness.  ? ? ?  Objective  ?  ?BP 139/77 Comment: 1304 reading  Pulse 68   Temp 97.8 ?F (36.6 ?C) (Oral)   Resp 12   Wt 148 lb (67.1 kg)   SpO2 99% Comment: room air  BMI 27.07 kg/m?  ? ?Today's Vitals  ? 09/14/21 1304 09/14/21 1310 09/14/21 1311  ?BP: 139/77 (!) 144/84 139/77  ?Pulse: 68    ?Resp: 12    ?Temp: 97.8 ?F (36.6 ?C)    ?TempSrc: Oral    ?SpO2: 99%    ?Weight: 148 lb (67.1 kg)    ? ?Body mass index is 27.07 kg/m?.  ? ? ?Physical Exam ?Vitals and nursing note reviewed.  ?Constitutional:   ?   General: She is not in acute distress. ?   Appearance: Normal appearance. She is overweight. She is not ill-appearing, toxic-appearing or diaphoretic.  ?HENT:  ?   Head: Normocephalic and atraumatic.  ?Cardiovascular:  ?   Rate and Rhythm: Normal rate and regular rhythm.  ?   Pulses: Normal pulses.  ?   Heart sounds: Normal heart sounds. No murmur heard. ?  No friction rub. No gallop.  ?Pulmonary:  ?   Effort: Pulmonary effort is normal. No respiratory distress.  ?   Breath sounds: Normal breath sounds. No stridor. No wheezing, rhonchi or rales.  ?Chest:  ?   Chest wall: No tenderness.  ?Musculoskeletal:     ?   General: No swelling, tenderness, deformity or signs of injury. Normal range of motion.  ?   Right lower leg: No edema.  ?   Left lower leg: No edema.  ?Skin: ?   General: Skin is warm and dry.  ?   Capillary Refill: Capillary refill takes less than 2 seconds.  ?   Coloration: Skin is not jaundiced or pale.  ?   Findings: Rash present. No bruising, erythema or lesion.  ?   Comments: Continues to have ongoing fungal rash under L breast   ?Neurological:  ?   General: No focal deficit  present.  ?   Mental Status: She is alert and oriented to person, place, and time. Mental status is at baseline.  ?   Cranial Nerves: No cranial nerve deficit.  ?   Sensory: No sensory deficit.  ?   Motor: No weakness.  ?   Coordination: Coordination normal.  ?Psychiatric:     ?   Mood and Affect: Mood normal.     ?   Behavior: Behavior normal.     ?   Thought Content: Thought content normal.     ?   Judgment: Judgment normal.  ?  ? ? ?  No results found for any visits on 09/14/21. ? Assessment & Plan  ?  ? ?Problem List Items Addressed This Visit   ? ?  ? Cardiovascular and Mediastinum  ? Primary hypertension - Primary  ?  Chronic, stable/borderline; continues to Take Norvasc 7.5 mg daily (5 and 2.5 mg doses combined) ?Denies CP ?Denies SOB/ DOE ?Denies low blood pressure/hypotension ?Denies vision changes ?No LE Edema noted on exam ?Denies side effects ?Continues to work on sodium in diet and being more active now that the weather is warmer  ?RTC in 6 months for chronic follow up ?Seek emergent care if you develop chest pain or chest pressure ? ?  ?  ?  ? Respiratory  ? Other seasonal allergic rhinitis  ?  Acute, stable ?Due for medication refill later this summer ?Wishes to have refilled today to prevent calls from pharmacy  ? ?  ?  ? Relevant Medications  ? fluticasone (FLONASE) 50 MCG/ACT nasal spray  ?  ? Musculoskeletal and Integument  ? Skin yeast infection  ?  Ongoing acute, stable- yeast under L breast; worse with activity/sweating ?Recommend continued use of antifungal powder and oral antifungal daily until resolved  ?Recommend follow up with derm if it continues to have exacerbations  ? ?  ?  ? Relevant Medications  ? fluconazole (DIFLUCAN) 50 MG tablet  ? ? ? ?Return in about 6 months (around 03/17/2022) for chonic disease management.  ?   ? ?I, Gwyneth Sprout, FNP, have reviewed all documentation for this visit. The documentation on 09/14/21 for the exam, diagnosis, procedures, and orders are all accurate  and complete. ? ? ? ?Gwyneth Sprout, FNP  ?[place] ?[phone] (phone) ?[phone] (fax) ? ?[facility]  ?

## 2021-09-14 NOTE — Assessment & Plan Note (Signed)
Acute, stable ?Due for medication refill later this summer ?Wishes to have refilled today to prevent calls from pharmacy  ?

## 2021-09-14 NOTE — Assessment & Plan Note (Signed)
Chronic, stable/borderline; continues to Take Norvasc 7.5 mg daily (5 and 2.5 mg doses combined) ?Denies CP ?Denies SOB/ DOE ?Denies low blood pressure/hypotension ?Denies vision changes ?No LE Edema noted on exam ?Denies side effects ?Continues to work on sodium in diet and being more active now that the weather is warmer  ?RTC in 6 months for chronic follow up ?Seek emergent care if you develop chest pain or chest pressure ? ?

## 2021-09-14 NOTE — Assessment & Plan Note (Addendum)
Ongoing acute, stable- yeast under L breast; worse with activity/sweating ?Recommend continued use of antifungal powder and oral antifungal daily until resolved  ?Recommend follow up with derm if it continues to have exacerbations  ?

## 2021-09-19 ENCOUNTER — Telehealth: Payer: Self-pay

## 2021-09-19 ENCOUNTER — Other Ambulatory Visit: Payer: Self-pay | Admitting: Family Medicine

## 2021-09-19 DIAGNOSIS — N950 Postmenopausal bleeding: Secondary | ICD-10-CM

## 2021-09-19 DIAGNOSIS — R9389 Abnormal findings on diagnostic imaging of other specified body structures: Secondary | ICD-10-CM

## 2021-09-19 DIAGNOSIS — N840 Polyp of corpus uteri: Secondary | ICD-10-CM

## 2021-09-19 DIAGNOSIS — R1310 Dysphagia, unspecified: Secondary | ICD-10-CM

## 2021-09-19 NOTE — Telephone Encounter (Signed)
Copied from [place] [phone]. Topic: General - Other ?>> Sep 19, 2021  1:23 PM Erick Blinks wrote: ?Reason for CRM: Pt wants to speak to the clinic about her Rx for pantoprazole, she says she was never charged for refills before and now she has been getting charged. Please advise  ? ?Best contact: [phone] ?

## 2021-09-20 NOTE — Telephone Encounter (Signed)
Patient states that pharmacist told her that she would need a authorization code or authroization for further refills, patient states that sig might need to be changed as well for medication to be covered. Will await for fax transmission to be received from pharmacy is authroization is needed. KW ?

## 2021-09-21 ENCOUNTER — Other Ambulatory Visit: Payer: Self-pay | Admitting: Dermatology

## 2021-09-21 ENCOUNTER — Other Ambulatory Visit: Payer: Self-pay | Admitting: Family Medicine

## 2021-09-21 DIAGNOSIS — N950 Postmenopausal bleeding: Secondary | ICD-10-CM

## 2021-09-21 DIAGNOSIS — R9389 Abnormal findings on diagnostic imaging of other specified body structures: Secondary | ICD-10-CM

## 2021-09-21 DIAGNOSIS — N840 Polyp of corpus uteri: Secondary | ICD-10-CM

## 2021-09-21 DIAGNOSIS — L409 Psoriasis, unspecified: Secondary | ICD-10-CM

## 2021-09-21 DIAGNOSIS — R1310 Dysphagia, unspecified: Secondary | ICD-10-CM

## 2021-09-21 NOTE — Telephone Encounter (Signed)
Requested medication (s) are due for refill today: No ? ?Requested medication (s) are on the active medication list: Yes ? ?Last refill:  08/16/21 ? ?Future visit scheduled: Yes ? ?Notes to clinic:  Pharmacy requests prior authorization. ? ? ? ?Requested Prescriptions  ?Pending Prescriptions Disp Refills  ? lansoprazole (PREVACID) 30 MG capsule [Pharmacy Med Name: LANSOPRAZOLE DR 30 MG CAPSULE]  0  ?  ? Gastroenterology: Proton Pump Inhibitors 2 Failed - 09/19/2021  3:24 PM  ?  ?  Failed - ALT in normal range and within 360 days  ?  ALT  ?Date Value Ref Range Status  ?08/02/2020 20 0 - 32 IU/L Final  ?   ?  ?  Failed - AST in normal range and within 360 days  ?  AST  ?Date Value Ref Range Status  ?08/02/2020 24 0 - 40 IU/L Final  ?   ?  ?  Passed - Valid encounter within last 12 months  ?  Recent Outpatient Visits   ? ?      ? 1 week ago Primary hypertension  ? [facility] Tally Joe T, FNP  ? 1 month ago Annual physical exam  ? [facility] Tally Joe T, FNP  ? 3 months ago Postmenopausal bleeding  ? [facility], Dionne Bucy, MD  ? 6 months ago GAD (generalized anxiety disorder)  ? [facility] Gwyneth Sprout, FNP  ? 8 months ago GAD (generalized anxiety disorder)  ? [facility] [place], Dionne Bucy, MD  ? ?  ?  ?Future Appointments   ? ?        ? In 3 months Ralene Bathe, MD [place]  ? In 5 months Gwyneth Sprout, [place], [facility]  ? ?  ? ? ?  ?  ?  ? ?

## 2021-09-21 NOTE — Telephone Encounter (Signed)
Requested medication (s) are due for refill today: Refilled today ? ?Requested medication (s) are on the active medication list: yes   ? ?Last refill: 09/21/21  #180  3 refills ? ?Future visit scheduled  ? ?Notes to clinic:  Pharmacy requesting alternate med. Please review. Thank you. ? ?Requested Prescriptions  ?Pending Prescriptions Disp Refills  ? pantoprazole (PROTONIX) 40 MG tablet [Pharmacy Med Name: PANTOPRAZOLE SOD DR 40 MG TAB]  0  ?  ? Gastroenterology: Proton Pump Inhibitors Passed - 09/21/2021 10:26 AM  ?  ?  Passed - Valid encounter within last 12 months  ?  Recent Outpatient Visits   ? ?      ? 1 week ago Primary hypertension  ? [facility] Tally Joe T, FNP  ? 1 month ago Annual physical exam  ? [facility] Tally Joe T, FNP  ? 3 months ago Postmenopausal bleeding  ? [facility] [place], Dionne Bucy, MD  ? 6 months ago GAD (generalized anxiety disorder)  ? [facility] Gwyneth Sprout, FNP  ? 8 months ago GAD (generalized anxiety disorder)  ? [facility] [place], Dionne Bucy, MD  ? ?  ?  ?Future Appointments   ? ?        ? In 3 months Ralene Bathe, MD [place]  ? In 5 months Gwyneth Sprout, [place], [facility]  ? ?  ? ? ?  ?  ?  ? ? ? ? ?

## 2021-09-28 ENCOUNTER — Other Ambulatory Visit: Payer: Self-pay | Admitting: Family Medicine

## 2021-09-28 MED ORDER — PANTOPRAZOLE SODIUM 40 MG PO TBEC: 40.0000 mg | DELAYED_RELEASE_TABLET | Freq: Every day | ORAL | 3 refills | Status: DC

## 2021-09-28 NOTE — Telephone Encounter (Signed)
Pt calling to get an update on her Rx pantoprazole (PROTONIX) 40 MG tablet . Pt states her scripts are supposed to be filled. Pt did not know this had been denied.  She would prefer to stay on this medication.  Can you do a pre authorization?  Pt has not picked up the prevacid. Can you send in new Rx since this was dc'd?

## 2021-09-30 ENCOUNTER — Other Ambulatory Visit: Payer: Self-pay | Admitting: Family Medicine

## 2021-10-07 ENCOUNTER — Telehealth: Payer: Self-pay | Admitting: Family Medicine

## 2021-10-07 ENCOUNTER — Telehealth: Payer: Self-pay

## 2021-10-07 NOTE — Telephone Encounter (Signed)
Pt called about issue she is having with pantoprazole (PROTONIX) 40 MG tablet / pt asked to speak with Daneil Dan or the nurse about this and stated she spoke with her insurance yesterday / please advise

## 2021-10-07 NOTE — Telephone Encounter (Signed)
Copied from [place] [id number]. Topic: General - Other >> Oct 07, 2021 11:28 AM Tessa Lerner A wrote: Reason for CRM: The patient has been directed to contact their PCP and request completion of a prior authorization for their pantoprazole (PROTONIX) 40 MG tablet [[id number]]   The patient has had a history of issues with their insurance processing this request   The patient would like to speak with a member of clinical staff directly about this issue further when possible   Please contact further

## 2021-10-14 ENCOUNTER — Other Ambulatory Visit: Payer: Self-pay | Admitting: Family Medicine

## 2021-10-14 NOTE — Telephone Encounter (Signed)
Pt called in states, was told provider needs to start appeal with her insurance for her denial of refill on pantoprazole (PROTONIX) 40 MG tablet that pa was denied for. She gave me their fx # for her insurance, [phone]

## 2021-10-14 NOTE — Telephone Encounter (Signed)
Pt called back to check status of request, she says she is going to contact her insurance to see if they have received the PA from the office. She is requesting a call back from the clinic to discuss alternative options in the case that her PA is not approved.

## 2021-10-14 NOTE — Telephone Encounter (Signed)
Pt made an additional call, pt stated she needs to start an appeal for this Rx.

## 2021-10-14 NOTE — Telephone Encounter (Signed)
Requested medication (s) are due for refill today - no  Requested medication (s) are on the active medication list -no  Future visit scheduled -yes  Last refill: not on list  Notes to clinic: Patient is requesting PA for original Rx- 09/28/21 #90 3RF- pantoprazole 40 mg  Requested Prescriptions  Pending Prescriptions Disp Refills   lansoprazole (PREVACID) 30 MG capsule [Pharmacy Med Name: LANSOPRAZOLE DR 30 MG CAPSULE]  0    Sig: Take 1 tablet (40 mg total) by mouth daily.     Gastroenterology: Proton Pump Inhibitors 2 Failed - 10/14/2021  9:30 AM      Failed - ALT in normal range and within 360 days    ALT  Date Value Ref Range Status  08/02/2020 20 0 - 32 IU/L Final         Failed - AST in normal range and within 360 days    AST  Date Value Ref Range Status  08/02/2020 24 0 - 40 IU/L Final         Passed - Valid encounter within last 12 months    Recent Outpatient Visits           1 month ago Primary hypertension   [facility] Gwyneth Sprout, FNP   2 months ago Annual physical exam   [facility] Tally Joe T, FNP   4 months ago Postmenopausal bleeding   [facility], Dionne Bucy, MD   7 months ago GAD (generalized anxiety disorder)   [facility] Tally Joe T, FNP   9 months ago GAD (generalized anxiety disorder)   [facility], Dionne Bucy, MD       Future Appointments             In 3 months Ralene Bathe, MD [place]   In 4 months Gwyneth Sprout, [place], [facility]               Requested Prescriptions  Pending Prescriptions Disp Refills   lansoprazole (PREVACID) 30 MG capsule [Pharmacy Med Name: LANSOPRAZOLE DR 30 MG CAPSULE]  0    Sig: Take 1 tablet (40 mg total) by mouth daily.     Gastroenterology: Proton Pump Inhibitors 2 Failed - 10/14/2021  9:30 AM      Failed - ALT in normal range and within 360 days    ALT  Date  Value Ref Range Status  08/02/2020 20 0 - 32 IU/L Final         Failed - AST in normal range and within 360 days    AST  Date Value Ref Range Status  08/02/2020 24 0 - 40 IU/L Final         Passed - Valid encounter within last 12 months    Recent Outpatient Visits           1 month ago Primary hypertension   [facility] Gwyneth Sprout, FNP   2 months ago Annual physical exam   [facility] Tally Joe T, FNP   4 months ago Postmenopausal bleeding   [facility] [place], Dionne Bucy, MD   7 months ago GAD (generalized anxiety disorder)   [facility] Tally Joe T, FNP   9 months ago GAD (generalized anxiety disorder)   [facility], Dionne Bucy, MD       Future Appointments  In 3 months Ralene Bathe, MD [place]   In 4 months Gwyneth Sprout, [place], [place]

## 2021-10-17 ENCOUNTER — Other Ambulatory Visit: Payer: Self-pay | Admitting: Family Medicine

## 2021-10-17 DIAGNOSIS — F411 Generalized anxiety disorder: Secondary | ICD-10-CM

## 2021-10-18 NOTE — Telephone Encounter (Signed)
Pt calling back and requesting an update. Pt stated she had a missed call, and it may be about this.     Please advise

## 2021-10-18 NOTE — Telephone Encounter (Signed)
Pt is having issues with phone pls return call if possible [phone]

## 2021-10-18 NOTE — Telephone Encounter (Signed)
Spoke with patient, she states she does not care what medication is approved as long as it is something to help with GERD. Asked for [place] to do an appeal through her insurance, which is being worked on.

## 2021-10-18 NOTE — Telephone Encounter (Signed)
Patient was called twice with no answer in the last hour. Will try again before end of day.

## 2021-10-26 NOTE — Telephone Encounter (Signed)
Appeal was resent to [place], left message on patient's VM notifying her.

## 2021-10-26 NOTE — Telephone Encounter (Signed)
Marlon from [employer] member services called due to pt calling them about status of appeal / he stated they have not received appeal that was advised of being faxed on 6.15.23/ please resend to fax# [phone]

## 2021-11-14 ENCOUNTER — Other Ambulatory Visit: Payer: Self-pay | Admitting: Podiatry

## 2021-11-14 NOTE — Telephone Encounter (Signed)
Pt called to report that Holland Falling has still not received fax from the office, she is requesting a call back to help resolve this. Please advise  She is providing a new fax number (same as prior)   Fax: [phone]

## 2021-11-15 NOTE — Telephone Encounter (Signed)
Spoke with patient. Let her know that we have faxes from her insurance stating that the appeal and PA were denied. She said she is taking OTC meds to help with GERD.

## 2021-11-15 NOTE — Telephone Encounter (Signed)
Spoke with patient.

## 2021-12-05 ENCOUNTER — Other Ambulatory Visit: Payer: Self-pay | Admitting: Family Medicine

## 2021-12-05 DIAGNOSIS — F411 Generalized anxiety disorder: Secondary | ICD-10-CM

## 2021-12-07 NOTE — Telephone Encounter (Signed)
Requested medication (s) are due for refill today: yes  Requested medication (s) are on the active medication list: yes  Last refill:  10/18/21 #60  Future visit scheduled: yes  Notes to clinic:  med not delegated to NT to RF   Requested Prescriptions  Pending Prescriptions Disp Refills   ALPRAZolam (XANAX) 0.25 MG tablet [Pharmacy Med Name: ALPRAZOLAM 0.25 MG TABLET] 60 tablet 0    Sig: USE 1-2 TAB AS NEEDED FOR ANXIETY. DO NOT MIX WITH PAIN MED OR ALCOHOL DUE TO RISK OF RESPIRATORY DEPRESSION.     Not Delegated - Psychiatry: Anxiolytics/Hypnotics 2 Failed - 12/05/2021  4:47 PM      Failed - This refill cannot be delegated      Failed - Urine Drug Screen completed in last 360 days      Passed - Patient is not pregnant      Passed - Valid encounter within last 6 months    Recent Outpatient Visits           2 months ago Primary hypertension   [facility] Gwyneth Sprout, FNP   4 months ago Annual physical exam   [facility] Tally Joe T, FNP   6 months ago Postmenopausal bleeding   [facility], Dionne Bucy, MD   8 months ago GAD (generalized anxiety disorder)   [facility] Tally Joe T, FNP   10 months ago GAD (generalized anxiety disorder)   [facility], Dionne Bucy, MD       Future Appointments             In 1 month Ralene Bathe, MD [place]   In 3 months Gwyneth Sprout, [place], [place]

## 2022-01-06 ENCOUNTER — Other Ambulatory Visit: Payer: Self-pay | Admitting: Family Medicine

## 2022-01-06 DIAGNOSIS — Z1231 Encounter for screening mammogram for malignant neoplasm of breast: Secondary | ICD-10-CM

## 2022-01-06 DIAGNOSIS — I10 Essential (primary) hypertension: Secondary | ICD-10-CM

## 2022-01-10 NOTE — Telephone Encounter (Signed)
Requested Prescriptions  Pending Prescriptions Disp Refills  . amLODipine (NORVASC) 5 MG tablet [Pharmacy Med Name: AMLODIPINE BESYLATE 5 MG TAB] 30 tablet 5    Sig: TAKE 1 TABLET (5 MG TOTAL) BY MOUTH DAILY. TAKE IN ADDITION TO 2.5 MG DOSE, TOTAL OF 7.5 MG.     Cardiovascular: Calcium Channel Blockers 2 Passed - 01/06/2022 11:19 AM      Passed - Last BP in normal range    BP Readings from Last 1 Encounters:  09/14/21 139/77         Passed - Last Heart Rate in normal range    Pulse Readings from Last 1 Encounters:  09/14/21 68         Passed - Valid encounter within last 6 months    Recent Outpatient Visits          3 months ago Primary hypertension   [facility] Gwyneth Sprout, FNP   5 months ago Annual physical exam   [facility] Tally Joe T, FNP   7 months ago Postmenopausal bleeding   [facility], Dionne Bucy, MD   10 months ago GAD (generalized anxiety disorder)   [facility] Tally Joe T, FNP   11 months ago GAD (generalized anxiety disorder)   [facility], Dionne Bucy, MD      Future Appointments            In 6 days Ralene Bathe, MD [place]   In 1 month Gwyneth Sprout, [place], [facility]           . amLODipine (NORVASC) 2.5 MG tablet [Pharmacy Med Name: AMLODIPINE BESYLATE 2.5 MG TAB] 30 tablet 5    Sig: TAKE 1 TABLET (2.5 MG TOTAL) BY MOUTH DAILY. TAKE IN ADDITION TO 5 MG DOSE, TOTAL OF 7.5 MG.     Cardiovascular: Calcium Channel Blockers 2 Passed - 01/06/2022 11:19 AM      Passed - Last BP in normal range    BP Readings from Last 1 Encounters:  09/14/21 139/77         Passed - Last Heart Rate in normal range    Pulse Readings from Last 1 Encounters:  09/14/21 68         Passed - Valid encounter within last 6 months    Recent Outpatient Visits          3 months ago Primary hypertension   [facility] Gwyneth Sprout, FNP   5 months ago Annual physical exam   [facility] Gwyneth Sprout, FNP   7 months ago Postmenopausal bleeding   [facility], Dionne Bucy, MD   10 months ago GAD (generalized anxiety disorder)   [facility] Tally Joe T, FNP   11 months ago GAD (generalized anxiety disorder)   [facility], Dionne Bucy, MD      Future Appointments            In 6 days Ralene Bathe, MD [place]   In 1 month Gwyneth Sprout, [place], [place]

## 2022-01-16 ENCOUNTER — Ambulatory Visit: Payer: 59 | Admitting: Dermatology

## 2022-01-16 DIAGNOSIS — L304 Erythema intertrigo: Secondary | ICD-10-CM | POA: Diagnosis not present

## 2022-01-16 DIAGNOSIS — L814 Other melanin hyperpigmentation: Secondary | ICD-10-CM | POA: Diagnosis not present

## 2022-01-16 DIAGNOSIS — L821 Other seborrheic keratosis: Secondary | ICD-10-CM

## 2022-01-16 DIAGNOSIS — Z79899 Other long term (current) drug therapy: Secondary | ICD-10-CM

## 2022-01-16 DIAGNOSIS — L409 Psoriasis, unspecified: Secondary | ICD-10-CM

## 2022-01-16 DIAGNOSIS — L408 Other psoriasis: Secondary | ICD-10-CM | POA: Diagnosis not present

## 2022-01-16 DIAGNOSIS — L578 Other skin changes due to chronic exposure to nonionizing radiation: Secondary | ICD-10-CM | POA: Diagnosis not present

## 2022-01-16 DIAGNOSIS — L820 Inflamed seborrheic keratosis: Secondary | ICD-10-CM

## 2022-01-16 MED ORDER — QBREXZA 2.4 % EX PADS: 1.0000 "application " | MEDICATED_PAD | Freq: Every day | CUTANEOUS | 6 refills | Status: DC

## 2022-01-16 NOTE — Patient Instructions (Signed)
Instructions for [facility] Medications  One or more of your medications was sent to the [facility] mail order compounding pharmacy. You will receive an email from them and can purchase the medicine through that link. It will then be mailed to your home at the address you confirmed. If for any reason you do not receive an email from them, please check your spam folder. If you still do not find the email, please let us know. [facility] phone number is [phone].   

## 2022-01-16 NOTE — Progress Notes (Unsigned)
Follow-Up Visit   Subjective  Gina [name] is a 66 y.o. female who presents for the following: Skin Cancer (6 month follow up - Otezla 30 mg 1 po qd - well controlled with no side effects from [place]) and Other (Intertrigo of inframammary area - started Fluconazole about 1 week ago).  The following portions of the chart were reviewed this encounter and updated as appropriate:   Tobacco  Allergies  Meds  Problems  Med Hx  Surg Hx  Fam Hx     Review of Systems:  No other skin or systemic complaints except as noted in HPI or Assessment and Plan.  Objective  Well appearing patient in no apparent distress; mood and affect are within normal limits.  A focused examination was performed including chest, axillae, abdomen, back, and buttocks. Relevant physical exam findings are noted in the Assessment and Plan.  Inframammary areas Erythema  Back x 2, left tricep x 3 (5) Erythematous stuck-on, waxy papule or plaque   Assessment & Plan   Lentigines - Scattered tan macules - Due to sun exposure - Benign-appering, observe - Recommend daily broad spectrum sunscreen SPF 30+ to sun-exposed areas, reapply every 2 hours as needed. - Call for any changes  Seborrheic Keratoses - Stuck-on, waxy, tan-brown papules and/or plaques  - Benign-appearing - Discussed benign etiology and prognosis. - Observe - Call for any changes  Actinic Damage - chronic, secondary to cumulative UV radiation exposure/sun exposure over time - diffuse scaly erythematous macules with underlying dyspigmentation - Recommend daily broad spectrum sunscreen SPF 30+ to sun-exposed areas, reapply every 2 hours as needed.  - Recommend staying in the shade or wearing long sleeves, sun glasses (UVA+UVB protection) and wide brim hats (4-inch brim around the entire circumference of the hat). - Call for new or changing lesions.  Erythema intertrigo With inverse psoriasis Inframammary areas Chronic and persistent  condition with duration or expected duration over one year. Condition is symptomatic / bothersome to patient. Not to goal.  Intertrigo is a chronic recurrent rash that occurs in skin fold areas that may be associated with friction; heat; moisture; yeast; fungus; and bacteria.  It is exacerbated by increased movement / activity; sweating; and higher atmospheric temperature.  Start [facility] Iodoquinol 1%, Hydrocortisone 2.5%, Niacinamide 2% Cream twice a day to affected areas for up to two weeks.  The patient was advised this is not covered by insurance since it is made by a compounding pharmacy. They will receive an email to check out and the medication will be mailed to their home.    Glycopyrronium Tosylate (QBREXZA) 2.4 % PADS - Inframammary areas Apply 1 application  topically daily.  Related Medications ketoconazole (NIZORAL) 2 % cream APPLY UNDER THE BREAST DAILY  Inflamed seborrheic keratosis (5) Back x 2, left tricep x 3  Destruction of lesion - Back x 2, left tricep x 3 Complexity: simple   Destruction method: cryotherapy   Informed consent: discussed and consent obtained   Timeout:  patient name, date of birth, surgical site, and procedure verified Lesion destroyed using liquid nitrogen: Yes   Region frozen until ice ball extended beyond lesion: Yes   Outcome: patient tolerated procedure well with no complications   Post-procedure details: wound care instructions given    Psoriasis With inverse psoriasis and intertrigo Psoriasis is a chronic non-curable, but treatable genetic/hereditary disease that may have other systemic features affecting other organ systems such as joints (Psoriatic Arthritis). It is associated with an increased risk  of inflammatory bowel disease, heart disease, non-alcoholic fatty liver disease, and depression.    Better controlled - continue Otezla 30 mg 1 po qd No side effects from oral Otezla  Related Medications OTEZLA 30 MG TABS TAKE 1  TABLET BY MOUTH TWICE DAILY  Return in about 6 months (around 07/17/2022) for TBSE.  I, Ashok Cordia, CMA, am acting as scribe for Sarina Ser, MD . Documentation: I have reviewed the above documentation for accuracy and completeness, and I agree with the above.  Sarina Ser, MD

## 2022-01-17 ENCOUNTER — Encounter: Payer: Self-pay | Admitting: Dermatology

## 2022-01-24 ENCOUNTER — Other Ambulatory Visit: Payer: Self-pay | Admitting: Dermatology

## 2022-01-24 DIAGNOSIS — R9389 Abnormal findings on diagnostic imaging of other specified body structures: Secondary | ICD-10-CM

## 2022-01-24 DIAGNOSIS — L304 Erythema intertrigo: Secondary | ICD-10-CM

## 2022-01-24 DIAGNOSIS — N840 Polyp of corpus uteri: Secondary | ICD-10-CM

## 2022-01-24 DIAGNOSIS — N950 Postmenopausal bleeding: Secondary | ICD-10-CM

## 2022-01-26 ENCOUNTER — Ambulatory Visit
Admission: RE | Admit: 2022-01-26 | Discharge: 2022-01-26 | Disposition: A | Discharge status: Home / Self Care | Payer: 59 | Source: Ambulatory Visit | Attending: Family Medicine | Admitting: Family Medicine

## 2022-01-26 DIAGNOSIS — Z1231 Encounter for screening mammogram for malignant neoplasm of breast: Secondary | ICD-10-CM | POA: Insufficient documentation

## 2022-01-27 NOTE — Progress Notes (Signed)
Hi Ranae  Normal mammogram; repeat in 1 year.  Please let us know if you have any questions.  Thank you,  Menelik Mcfarren, FNP 

## 2022-02-06 ENCOUNTER — Other Ambulatory Visit: Payer: Self-pay | Admitting: Family Medicine

## 2022-02-06 DIAGNOSIS — F411 Generalized anxiety disorder: Secondary | ICD-10-CM

## 2022-02-14 ENCOUNTER — Other Ambulatory Visit: Payer: Self-pay | Admitting: Podiatry

## 2022-03-02 NOTE — Progress Notes (Deleted)
Established patient visit   Patient: Gina [name]   DOB: [birth year]   66 y.o. Female  MRN: [id number] Visit Date: 03/08/2022  Today's healthcare provider: Gwyneth Sprout, FNP   I,Lulubelle Simcoe J Saloma Cadena,acting as a scribe for Gwyneth Sprout, FNP.,have documented all relevant documentation on the behalf of Gwyneth Sprout, FNP,as directed by  Gwyneth Sprout, FNP while in the presence of Gwyneth Sprout, FNP.   Chief Complaint  Patient presents with   Hypertension   Hypothyroidism   Anxiety   Subjective    HPI  Hypertension, follow-up  BP Readings from Last 3 Encounters:  03/08/22 (!) 121/58  09/14/21 139/77  08/04/21 139/90   Wt Readings from Last 3 Encounters:  03/08/22 145 lb (65.8 kg)  09/14/21 148 lb (67.1 kg)  08/04/21 154 lb 1.6 oz (69.9 kg)     She was last seen for hypertension 6 months ago.  BP at that visit was 139/77. Management since that visit includes continue norvasc 7.5.  She reports excellent compliance with treatment. She is not having side effects.  She is following a Regular diet. She is exercising. She does not smoke.  Use of agents associated with hypertension: thyroid hormones.   Outside blood pressures are not checked. Symptoms: No chest pain No chest pressure  No palpitations No syncope  No dyspnea No orthopnea  No paroxysmal nocturnal dyspnea No lower extremity edema   Pertinent labs Lab Results  Component Value Date   CHOL 194 08/04/2021   HDL 61 08/04/2021   LDLCALC 110 (H) 08/04/2021   TRIG 131 08/04/2021   CHOLHDL 3.2 08/04/2021   Lab Results  Component Value Date   NA 138 06/21/2021   K 3.6 06/21/2021   CREATININE 0.70 06/21/2021   GFRNONAA >60 06/21/2021   GLUCOSE 92 06/21/2021   TSH 1.950 08/04/2021     The 10-year ASCVD risk score (Arnett DK, et al., 2019) is: 5.6%  Anxiety, Follow-up  She was last seen for anxiety 1 years ago. Changes made at last visit include continue xanax and trazodone.   She reports  excellent compliance with treatment. She reports excellent tolerance of treatment. She is not having side effects.   She feels her anxiety is mild and Improved since last visit.  Symptoms: No chest pain No difficulty concentrating  No dizziness No fatigue  No feelings of losing control No insomnia  No irritable No palpitations  No panic attacks No racing thoughts  No shortness of breath No sweating  No tremors/shakes    GAD-7 Results    01/17/2021    3:32 PM  GAD-7 Generalized Anxiety Disorder Screening Tool  1. Feeling Nervous, Anxious, or on Edge 0  2. Not Being Able to Stop or Control Worrying 0  3. Worrying Too Much About Different Things 0  4. Trouble Relaxing 0  5. Being So Restless it's Hard To Sit Still 0  6. Becoming Easily Annoyed or Irritable 1  7. Feeling Afraid As If Something Awful Might Happen 0  Total GAD-7 Score 1  Difficulty At Work, Home, or Getting  Along With Others? Not difficult at all    PHQ-9 Scores    03/08/2022    1:53 PM 08/04/2021   10:01 AM 06/02/2021    4:27 PM  PHQ9 SCORE ONLY  PHQ-9 Total Score 1 0 0    ---------------------------------------------------------------------------------------------------  Hypothyroid, follow-up  Lab Results  Component Value Date   TSH 1.950 08/04/2021   TSH  1.650 08/02/2020   TSH 2.410 08/01/2019   FREET4 1.39 08/04/2021   T4TOTAL 6.8 08/28/2017    Wt Readings from Last 3 Encounters:  03/08/22 145 lb (65.8 kg)  09/14/21 148 lb (67.1 kg)  08/04/21 154 lb 1.6 oz (69.9 kg)    She was last seen for hypothyroid 8 months ago.  Management since that visit includes continue levothyroxine . She reports excellent compliance with treatment. She is not having side effects.  Symptoms: No change in energy level No constipation  No diarrhea Yes heat / cold intolerance  No nervousness No palpitations  No weight changes     -----------------------------------------------------------------------------------------  ---------------------------------------------------------------------------------------------------   Medications: Outpatient Medications Prior to Visit  Medication Sig   ALPRAZolam (XANAX) 0.25 MG tablet TAKE 1-2 TAB AS NEEDED FOR ANXIETY DONT MIX WITH PAIN MED OR ALCOHOL DUE TO RISK OF RESP DEPRESSION   amLODipine (NORVASC) 2.5 MG tablet TAKE 1 TABLET (2.5 MG TOTAL) BY MOUTH DAILY. TAKE IN ADDITION TO 5 MG DOSE, TOTAL OF 7.5 MG.   amLODipine (NORVASC) 5 MG tablet TAKE 1 TABLET (5 MG TOTAL) BY MOUTH DAILY. TAKE IN ADDITION TO 2.5 MG DOSE, TOTAL OF 7.5 MG.   calcipotriene (DOVONOX) 0.005 % cream Apply topically daily. To psoriasis as needed 7 days a week   celecoxib (CELEBREX) 100 MG capsule TAKE 1 CAPSULE BY MOUTH TWICE A DAY   cetirizine (ZYRTEC) 10 MG tablet Take 10 mg by mouth daily as needed for allergies.   Cholecalciferol (VITAMIN D3) 1000 UNITS CAPS Take 1,000 Units by mouth at bedtime.    fluconazole (DIFLUCAN) 50 MG tablet Take 1 tablet (50 mg total) by mouth daily. Take daily for yeast infection under breast until resolved.   fluticasone (FLONASE) 50 MCG/ACT nasal spray Place 2 sprays into both nostrils daily as needed for rhinitis.   Glycopyrronium Tosylate (QBREXZA) 2.4 % PADS Apply 1 application  topically daily.   Halobetasol Propionate (BRYHALI) 0.01 % LOTN Apply 1 application. topically daily. Up to 5 days per week per flare   ketoconazole (NIZORAL) 2 % cream APPLY UNDER THE BREAST DAILY   levothyroxine (SYNTHROID) 25 MCG tablet TAKE 1 TABLET BY MOUTH DAILY BEFORE BREAKFAST.   nystatin (MYCOSTATIN/NYSTOP) powder Apply 1 application. topically 3 (three) times daily.   OTEZLA 30 MG TABS TAKE 1 TABLET BY MOUTH TWICE DAILY   pantoprazole (PROTONIX) 40 MG tablet Take 1 tablet (40 mg total) by mouth daily.   ibuprofen (ADVIL) 600 MG tablet Take 1 tablet (600 mg total) by mouth every 6 (six)  hours as needed. (Patient not taking: Reported on 03/08/2022)   traZODone (DESYREL) 50 MG tablet TAKE 0.5-1 TABLETS BY MOUTH AT BEDTIME AS NEEDED FOR SLEEP.   No facility-administered medications prior to visit.    Review of Systems  {Labs  Heme  Chem  Endocrine  Serology  Results Review (optional):23779}   Objective    BP (!) 121/58 (BP Location: Right Arm, Patient Position: Sitting, Cuff Size: Normal)   Pulse 70   Resp 16   Ht '5\' 2"'$  (1.575 m)   Wt 145 lb (65.8 kg)   SpO2 98%   BMI 26.52 kg/m  {Show previous vital signs (optional):23777}  Physical Exam  ***  No results found for any visits on 03/08/22.  Assessment & Plan     ***  No follow-ups on file.      {provider attestation***:1}   Gwyneth Sprout, [place] [phone] (phone) [phone] (fax)  [facility]

## 2022-03-08 ENCOUNTER — Encounter: Payer: Self-pay | Admitting: Family Medicine

## 2022-03-08 ENCOUNTER — Ambulatory Visit (INDEPENDENT_AMBULATORY_CARE_PROVIDER_SITE_OTHER): Payer: 59 | Admitting: Family Medicine

## 2022-03-08 VITALS — BP 121/58 | HR 70 | Resp 16 | Ht 62.0 in | Wt 145.0 lb

## 2022-03-08 DIAGNOSIS — I10 Essential (primary) hypertension: Secondary | ICD-10-CM | POA: Diagnosis not present

## 2022-03-08 DIAGNOSIS — Z23 Encounter for immunization: Secondary | ICD-10-CM | POA: Diagnosis not present

## 2022-03-08 MED ORDER — AMLODIPINE BESYLATE 5 MG PO TABS: 5.0000 mg | ORAL_TABLET | Freq: Every day | ORAL | 3 refills | Status: DC

## 2022-03-08 MED ORDER — AMLODIPINE BESYLATE 2.5 MG PO TABS: 2.5000 mg | ORAL_TABLET | Freq: Every day | ORAL | 3 refills | Status: DC

## 2022-03-08 NOTE — Progress Notes (Signed)
Established patient visit  Patient: Gina [name]   DOB: 08-11-1955   66 y.o. Female  MRN: [id number] Visit Date: 03/08/2022  Today's healthcare provider: Gwyneth Sprout, FNP  Re Introduced to nurse practitioner role and practice setting.  All questions answered.  Discussed provider/patient relationship and expectations.  I,Tiffany J Bragg,acting as a scribe for Gwyneth Sprout, FNP.,have documented all relevant documentation on the behalf of Gwyneth Sprout, FNP,as directed by  Gwyneth Sprout, FNP while in the presence of Gwyneth Sprout, FNP.  Chief Complaint  Patient presents with   Hypertension   Hypothyroidism   Anxiety   Subjective    HPI  Hypertension, follow-up  BP Readings from Last 3 Encounters:  03/08/22 (!) 121/58  09/14/21 139/77  08/04/21 139/90   Wt Readings from Last 3 Encounters:  03/08/22 145 lb (65.8 kg)  09/14/21 148 lb (67.1 kg)  08/04/21 154 lb 1.6 oz (69.9 kg)     She was last seen for hypertension 6 months ago.  BP at that visit was 139/77. Management since that visit includes continue norvasc 7.5.  She reports excellent compliance with treatment. She is not having side effects.  She is following a Regular diet. She is exercising. She does not smoke.  Use of agents associated with hypertension: thyroid hormones.   Outside blood pressures are not checked. Symptoms: No chest pain No chest pressure  No palpitations No syncope  No dyspnea No orthopnea  No paroxysmal nocturnal dyspnea No lower extremity edema   Pertinent labs Lab Results  Component Value Date   CHOL 194 08/04/2021   HDL 61 08/04/2021   LDLCALC 110 (H) 08/04/2021   TRIG 131 08/04/2021   CHOLHDL 3.2 08/04/2021   Lab Results  Component Value Date   NA 138 06/21/2021   K 3.6 06/21/2021   CREATININE 0.70 06/21/2021   GFRNONAA >60 06/21/2021   GLUCOSE 92 06/21/2021   TSH 1.950 08/04/2021     The 10-year ASCVD risk score (Arnett DK, et al., 2019) is: 5.6%  Anxiety,  Follow-up  She was last seen for anxiety 1 years ago. Changes made at last visit include continue xanax and trazodone.   She reports excellent compliance with treatment. She reports excellent tolerance of treatment. She is not having side effects.   She feels her anxiety is mild and Improved since last visit.  Symptoms: No chest pain No difficulty concentrating  No dizziness No fatigue  No feelings of losing control No insomnia  No irritable No palpitations  No panic attacks No racing thoughts  No shortness of breath No sweating  No tremors/shakes    GAD-7 Results    01/17/2021    3:32 PM  GAD-7 Generalized Anxiety Disorder Screening Tool  1. Feeling Nervous, Anxious, or on Edge 0  2. Not Being Able to Stop or Control Worrying 0  3. Worrying Too Much About Different Things 0  4. Trouble Relaxing 0  5. Being So Restless it's Hard To Sit Still 0  6. Becoming Easily Annoyed or Irritable 1  7. Feeling Afraid As If Something Awful Might Happen 0  Total GAD-7 Score 1  Difficulty At Work, Home, or Getting  Along With Others? Not difficult at all    PHQ-9 Scores    03/08/2022    1:53 PM 08/04/2021   10:01 AM 06/02/2021    4:27 PM  PHQ9 SCORE ONLY  PHQ-9 Total Score 1 0 0    ---------------------------------------------------------------------------------------------------  Hypothyroid, follow-up  Lab Results  Component Value Date   TSH 1.950 08/04/2021   TSH 1.650 08/02/2020   TSH 2.410 08/01/2019   FREET4 1.39 08/04/2021   T4TOTAL 6.8 08/28/2017    Wt Readings from Last 3 Encounters:  03/08/22 145 lb (65.8 kg)  09/14/21 148 lb (67.1 kg)  08/04/21 154 lb 1.6 oz (69.9 kg)    She was last seen for hypothyroid 8 months ago.  Management since that visit includes continue levothyroxine . She reports excellent compliance with treatment. She is not having side effects.  Symptoms: No change in energy level No constipation  No diarrhea Yes heat / cold intolerance   No nervousness No palpitations  No weight changes    -----------------------------------------------------------------------------------------  ---------------------------------------------------------------------------------------------------   Medications: Outpatient Medications Prior to Visit  Medication Sig   ALPRAZolam (XANAX) 0.25 MG tablet TAKE 1-2 TAB AS NEEDED FOR ANXIETY DONT MIX WITH PAIN MED OR ALCOHOL DUE TO RISK OF RESP DEPRESSION   calcipotriene (DOVONOX) 0.005 % cream Apply topically daily. To psoriasis as needed 7 days a week   celecoxib (CELEBREX) 100 MG capsule TAKE 1 CAPSULE BY MOUTH TWICE A DAY   cetirizine (ZYRTEC) 10 MG tablet Take 10 mg by mouth daily as needed for allergies.   Cholecalciferol (VITAMIN D3) 1000 UNITS CAPS Take 1,000 Units by mouth at bedtime.    fluticasone (FLONASE) 50 MCG/ACT nasal spray Place 2 sprays into both nostrils daily as needed for rhinitis.   Glycopyrronium Tosylate (QBREXZA) 2.4 % PADS Apply 1 application  topically daily.   Halobetasol Propionate (BRYHALI) 0.01 % LOTN Apply 1 application. topically daily. Up to 5 days per week per flare   ketoconazole (NIZORAL) 2 % cream APPLY UNDER THE BREAST DAILY   levothyroxine (SYNTHROID) 25 MCG tablet TAKE 1 TABLET BY MOUTH DAILY BEFORE BREAKFAST.   OTEZLA 30 MG TABS TAKE 1 TABLET BY MOUTH TWICE DAILY   [DISCONTINUED] amLODipine (NORVASC) 2.5 MG tablet TAKE 1 TABLET (2.5 MG TOTAL) BY MOUTH DAILY. TAKE IN ADDITION TO 5 MG DOSE, TOTAL OF 7.5 MG.   [DISCONTINUED] amLODipine (NORVASC) 5 MG tablet TAKE 1 TABLET (5 MG TOTAL) BY MOUTH DAILY. TAKE IN ADDITION TO 2.5 MG DOSE, TOTAL OF 7.5 MG.   [DISCONTINUED] fluconazole (DIFLUCAN) 50 MG tablet Take 1 tablet (50 mg total) by mouth daily. Take daily for yeast infection under breast until resolved.   [DISCONTINUED] nystatin (MYCOSTATIN/NYSTOP) powder Apply 1 application. topically 3 (three) times daily.   [DISCONTINUED] pantoprazole (PROTONIX) 40 MG tablet  Take 1 tablet (40 mg total) by mouth daily.   [DISCONTINUED] ibuprofen (ADVIL) 600 MG tablet Take 1 tablet (600 mg total) by mouth every 6 (six) hours as needed. (Patient not taking: Reported on 03/08/2022)   [DISCONTINUED] traZODone (DESYREL) 50 MG tablet TAKE 0.5-1 TABLETS BY MOUTH AT BEDTIME AS NEEDED FOR SLEEP.   No facility-administered medications prior to visit.    Review of Systems    Objective    BP (!) 121/58 (BP Location: Right Arm, Patient Position: Sitting, Cuff Size: Normal)   Pulse 70   Resp 16   Ht '5\' 2"'$  (1.575 m)   Wt 145 lb (65.8 kg)   SpO2 98%   BMI 26.52 kg/m   Physical Exam Vitals and nursing note reviewed.  Constitutional:      General: She is not in acute distress.    Appearance: Normal appearance. She is overweight. She is not ill-appearing, toxic-appearing or diaphoretic.  HENT:     Head: Normocephalic and atraumatic.  Cardiovascular:     Rate and Rhythm: Normal rate and regular rhythm.     Pulses: Normal pulses.     Heart sounds: Normal heart sounds. No murmur heard.    No friction rub. No gallop.  Pulmonary:     Effort: Pulmonary effort is normal. No respiratory distress.     Breath sounds: Normal breath sounds. No stridor. No wheezing, rhonchi or rales.  Chest:     Chest wall: No tenderness.  Musculoskeletal:        General: No swelling, tenderness, deformity or signs of injury. Normal range of motion.     Right lower leg: No edema.     Left lower leg: No edema.  Skin:    General: Skin is warm and dry.     Capillary Refill: Capillary refill takes less than 2 seconds.     Coloration: Skin is not jaundiced or pale.     Findings: No bruising, erythema, lesion or rash.  Neurological:     General: No focal deficit present.     Mental Status: She is alert and oriented to person, place, and time. Mental status is at baseline.     Cranial Nerves: No cranial nerve deficit.     Sensory: No sensory deficit.     Motor: No weakness.     Coordination:  Coordination normal.  Psychiatric:        Mood and Affect: Mood normal.        Behavior: Behavior normal.        Thought Content: Thought content normal.        Judgment: Judgment normal.    No results found for any visits on 03/08/22.  Assessment & Plan     Problem List Items Addressed This Visit       Cardiovascular and Mediastinum   Primary hypertension - Primary    Chronic, improved At goal <140/<90 Continue norvasc 7.5       Relevant Medications   amLODipine (NORVASC) 2.5 MG tablet   amLODipine (NORVASC) 5 MG tablet     Other   Need for influenza vaccination   Relevant Orders   Flu Vaccine QUAD 6+ mos PF IM (Fluarix Quad PF) (Completed)   Return in about 5 months (around 08/07/2022) for annual examination.     Vonna Kotyk, FNP, have reviewed all documentation for this visit. The documentation on 03/08/22 for the exam, diagnosis, procedures, and orders are all accurate and complete.  Gwyneth Sprout, [place] [phone] (phone) [phone] (fax)  [place]

## 2022-03-08 NOTE — Assessment & Plan Note (Signed)
Chronic, improved At goal <140/<90 Continue norvasc 7.5

## 2022-03-16 ENCOUNTER — Telehealth: Payer: Self-pay | Admitting: Family Medicine

## 2022-03-16 ENCOUNTER — Other Ambulatory Visit: Payer: Self-pay | Admitting: Family Medicine

## 2022-03-16 NOTE — Telephone Encounter (Signed)
Patient called in states Dr Rollene Rotunda mentioned sending over Ibuprofen over from her 11/01 appt, not showing any notes about it. Please call back

## 2022-03-16 NOTE — Telephone Encounter (Signed)
Spoke with patient.

## 2022-03-23 ENCOUNTER — Other Ambulatory Visit: Payer: Self-pay | Admitting: Dermatology

## 2022-03-23 DIAGNOSIS — R9389 Abnormal findings on diagnostic imaging of other specified body structures: Secondary | ICD-10-CM

## 2022-03-23 DIAGNOSIS — N840 Polyp of corpus uteri: Secondary | ICD-10-CM

## 2022-03-23 DIAGNOSIS — L409 Psoriasis, unspecified: Secondary | ICD-10-CM

## 2022-03-23 DIAGNOSIS — N950 Postmenopausal bleeding: Secondary | ICD-10-CM

## 2022-04-02 ENCOUNTER — Other Ambulatory Visit: Payer: Self-pay | Admitting: Family Medicine

## 2022-04-02 DIAGNOSIS — F411 Generalized anxiety disorder: Secondary | ICD-10-CM

## 2022-05-12 ENCOUNTER — Ambulatory Visit: Payer: Self-pay

## 2022-05-12 NOTE — Telephone Encounter (Signed)
  Chief Complaint: URI Symptoms: HA, cough, congestion, runny nose, body aches  Frequency: past couple of days  Pertinent Negatives: Patient denies SOB or fever  Disposition: '[]'$ ED /'[]'$ Urgent Care (no appt availability in office) / '[x]'$ Appointment(In office/virtual)/ '[]'$  [facility]/ '[]'$ Home Care/ '[]'$ Refused Recommended Disposition /'[]'$ [facility]/ '[]'$  Follow-up with PCP Additional Notes: pt states she wanting to take something OTC for sx but unsure if she could take Sudafed or not. Advised pt that Sudafed can increase BP to try Mucinex first and if no relief of sx can take Sudafed just not often since pt doesn't have way of checking BP. Pt verbalized understanding. Has telephone OV on 05/15/22 at 1100 with PCP.   Summary: medication question   Pt requests return call to advise if it ok for her to take Sudafed for her cough and runny nose. Pt stated she has high blood pressure so she wanted to make sure it was ok for her to take Sudafed. Cb# [phone]     Reason for Disposition  [1] Nasal discharge AND [2] present > 10 days  Answer Assessment - Initial Assessment Questions 1. ONSET: "When did the nasal discharge start?"      Past couple of days  2. AMOUNT: "How much discharge is there?"      Moderate  3. COUGH: "Do you have a cough?" If Yes, ask: "Describe the color of your sputum" (clear, white, yellow, green)     Yes  4. RESPIRATORY DISTRESS: "Describe your breathing."      no 5. FEVER: "Do you have a fever?" If Yes, ask: "What is your temperature, how was it measured, and when did it start?"     No  7. OTHER SYMPTOMS: "Do you have any other symptoms?" (e.g., sore throat, earache, wheezing, vomiting)     HA, runny nose, body aches  Protocols used: Common Cold-A-AH

## 2022-05-15 ENCOUNTER — Telehealth (INDEPENDENT_AMBULATORY_CARE_PROVIDER_SITE_OTHER): Payer: 59 | Admitting: Family Medicine

## 2022-05-15 ENCOUNTER — Telehealth: Payer: 59 | Admitting: Family Medicine

## 2022-05-15 ENCOUNTER — Encounter: Payer: Self-pay | Admitting: Family Medicine

## 2022-05-15 DIAGNOSIS — B372 Candidiasis of skin and nail: Secondary | ICD-10-CM | POA: Diagnosis not present

## 2022-05-15 DIAGNOSIS — F411 Generalized anxiety disorder: Secondary | ICD-10-CM | POA: Diagnosis not present

## 2022-05-15 DIAGNOSIS — J0100 Acute maxillary sinusitis, unspecified: Secondary | ICD-10-CM

## 2022-05-15 DIAGNOSIS — R69 Illness, unspecified: Secondary | ICD-10-CM | POA: Diagnosis not present

## 2022-05-15 MED ORDER — AMOXICILLIN-POT CLAVULANATE 875-125 MG PO TABS: 1.0000 | ORAL_TABLET | Freq: Two times a day (BID) | ORAL | 0 refills | Status: DC

## 2022-05-15 MED ORDER — ALPRAZOLAM 0.25 MG PO TABS: 0.2500 mg | ORAL_TABLET | Freq: Two times a day (BID) | ORAL | 5 refills | Status: DC | PRN

## 2022-05-15 MED ORDER — AMOXICILLIN 875 MG PO TABS: 875.0000 mg | ORAL_TABLET | Freq: Two times a day (BID) | ORAL | 0 refills | Status: DC

## 2022-05-15 MED ORDER — FLUCONAZOLE 150 MG PO TABS: 300.0000 mg | ORAL_TABLET | Freq: Every day | ORAL | 0 refills | Status: DC

## 2022-05-15 NOTE — Assessment & Plan Note (Signed)
Chronic, stable Patient is aware of risks of abortive anxiety medication use to include increased sedation, respiratory suppression, falls, dependence and cardiovascular events.  Patient would like to continue treatment as benefit determined to outweigh risk.   Refills provided x 6 months; needs to be seen q6 months (1 visit in person)

## 2022-05-15 NOTE — Assessment & Plan Note (Signed)
Acute, stable Improving, self limiting Has tried OTC meds- decongestants only and increasing fluids and allowing rest Video guided self exam notes sinus tenderness in addition to congestion, cough, HA, plugged ear sensation and rhinorrhea which is likely contributing to PND/sore throat pain

## 2022-05-15 NOTE — Assessment & Plan Note (Signed)
Acute, recurrent Request for oral antifungals as topical OTCs are not assisting in skin improvement Previously f/b Derm

## 2022-05-15 NOTE — Progress Notes (Signed)
I,Joseline E Rosas,acting as a scribe for Gwyneth Sprout, FNP.,have documented all relevant documentation on the behalf of Gwyneth Sprout, FNP,as directed by  Gwyneth Sprout, FNP while in the presence of Gwyneth Sprout, FNP.   MyChart Video Visit  Virtual Visit via Video Note   This format is felt to be most appropriate for this patient at this time. Physical exam was limited by quality of the video and audio technology used for the visit.   Patient location: home Provider location: [facility] [address]  [place] [address] [place], [place] [address]  I discussed the limitations of evaluation and management by telemedicine and the availability of in person appointments. The patient expressed understanding and agreed to proceed.  Patient: Gina [name]   DOB: December 03, 1955   67 y.o. Female  MRN: [id number] Visit Date: 05/15/2022  Today's healthcare provider: Gwyneth Sprout, FNP  Re Introduced to nurse practitioner role and practice setting.  All questions answered.  Discussed provider/patient relationship and expectations.  Subjective    URI  This is a new problem. The current episode started in the past 7 days. The problem has been gradually improving. There has been no fever. Associated symptoms include congestion, coughing, headaches, a plugged ear sensation, rhinorrhea and sinus pain. Pertinent negatives include no ear pain, sore throat or wheezing. She has tried decongestant and increased fluids for the symptoms.    Medications: Outpatient Medications Prior to Visit  Medication Sig   amLODipine (NORVASC) 2.5 MG tablet Take 1 tablet (2.5 mg total) by mouth daily. Take in addition to 5 mg dose, total of 7.5 mg.   amLODipine (NORVASC) 5 MG tablet Take 1 tablet (5 mg total) by mouth daily. Take in addition to 2.5 mg dose, total of 7.5 mg.   calcipotriene (DOVONOX) 0.005 % cream Apply topically daily. To psoriasis as needed 7 days a week   celecoxib (CELEBREX) 100 MG capsule  TAKE 1 CAPSULE BY MOUTH TWICE A DAY   cetirizine (ZYRTEC) 10 MG tablet Take 10 mg by mouth daily as needed for allergies.   Cholecalciferol (VITAMIN D3) 1000 UNITS CAPS Take 1,000 Units by mouth at bedtime.    fluticasone (FLONASE) 50 MCG/ACT nasal spray Place 2 sprays into both nostrils daily as needed for rhinitis.   Glycopyrronium Tosylate (QBREXZA) 2.4 % PADS Apply 1 application  topically daily.   Halobetasol Propionate (BRYHALI) 0.01 % LOTN Apply 1 application. topically daily. Up to 5 days per week per flare   ketoconazole (NIZORAL) 2 % cream APPLY UNDER THE BREAST DAILY   levothyroxine (SYNTHROID) 25 MCG tablet TAKE 1 TABLET BY MOUTH DAILY BEFORE BREAKFAST.   OTEZLA 30 MG TABS TAKE 1 TABLET BY MOUTH 2 TIMES A DAY.   [DISCONTINUED] ALPRAZolam (XANAX) 0.25 MG tablet TAKE 1-2 TAB AS NEEDED FOR ANXIETY DONT MIX WITH PAIN MED OR ALCOHOL DUE TO RISK OF RESP DEPRESSION   No facility-administered medications prior to visit.    Review of Systems  HENT:  Positive for congestion, rhinorrhea and sinus pain. Negative for ear pain and sore throat.   Respiratory:  Positive for cough. Negative for wheezing.   Neurological:  Positive for headaches.    Objective    There were no vitals taken for this visit.  Physical Exam Constitutional:      General: She is not in acute distress.    Appearance: Normal appearance. She is not ill-appearing, toxic-appearing or diaphoretic.  HENT:     Nose:  Right Sinus: Maxillary sinus tenderness present. No frontal sinus tenderness.     Left Sinus: Maxillary sinus tenderness present. No frontal sinus tenderness.     Comments: Video guided self exam  Neck:     Comments: Video guided self exam Pulmonary:     Effort: Pulmonary effort is normal.  Musculoskeletal:     Cervical back: No muscular tenderness.  Neurological:     General: No focal deficit present.     Mental Status: She is alert. Mental status is at baseline.  Psychiatric:        Mood and  Affect: Mood normal.        Behavior: Behavior normal.        Thought Content: Thought content normal.        Judgment: Judgment normal.      Assessment & Plan     Problem List Items Addressed This Visit       Respiratory   Acute non-recurrent maxillary sinusitis - Primary    Acute, stable Improving, self limiting Has tried OTC meds- decongestants only and increasing fluids and allowing rest Video guided self exam notes sinus tenderness in addition to congestion, cough, HA, plugged ear sensation and rhinorrhea which is likely contributing to PND/sore throat pain       Relevant Medications   amoxicillin-clavulanate (AUGMENTIN) 875-125 MG tablet   fluconazole (DIFLUCAN) 150 MG tablet     Musculoskeletal and Integument   Skin yeast infection    Acute, recurrent Request for oral antifungals as topical OTCs are not assisting in skin improvement Previously f/b Derm      Relevant Medications   fluconazole (DIFLUCAN) 150 MG tablet     Other   GAD (generalized anxiety disorder)    Chronic, stable Patient is aware of risks of abortive anxiety medication use to include increased sedation, respiratory suppression, falls, dependence and cardiovascular events.  Patient would like to continue treatment as benefit determined to outweigh risk.   Refills provided x 6 months; needs to be seen q6 months (1 visit in person)      Relevant Medications   ALPRAZolam (XANAX) 0.25 MG tablet   Return if symptoms worsen or fail to improve.    I discussed the assessment and treatment plan with the patient. The patient was provided an opportunity to ask questions and all were answered. The patient agreed with the plan and demonstrated an understanding of the instructions.   The patient was advised to call back or seek an in-person evaluation if the symptoms worsen or if the condition fails to improve as anticipated.  I provided 10 minutes of face-to-face time during this encounter discussing  current sinus complaints as well as recurrent skin yeast infection and chronic anxiety.  Vonna Kotyk, FNP, have reviewed all documentation for this visit. The documentation on 05/15/22 for the exam, diagnosis, procedures, and orders are all accurate and complete.  Gwyneth Sprout, [place] [phone] (phone) [phone] (fax)  [place]

## 2022-05-16 ENCOUNTER — Ambulatory Visit: Payer: Self-pay | Admitting: *Deleted

## 2022-05-16 NOTE — Telephone Encounter (Signed)
LVMTCB. CRM created. Ok for P[facility] to advise 

## 2022-05-16 NOTE — Telephone Encounter (Signed)
Patient called in states seems to have  meds of amoxicillin, one pink pill and the other is white. Please call back to discuss why that is   Pt states when she picked up her Amoxicillin-clavulanate 875-125 (Augmentin) which is on her med profile, she was also given Amoxicillin 875. States she questioned pharmacist who stated that is what was ordered. States does have her name on bottle. Wants clarification she is to only take the Augmentin. Advised pt probably a mistake with pharmacy but will route to practice for PCPs review.     Reason for Disposition  [1] Caller has URGENT medicine question about med that PCP or specialist prescribed AND [2] triager unable to answer question  Answer Assessment - Initial Assessment Questions 1. NAME of MEDICINE: "What medicine(s) are you calling about?"     Amoxicillin 2. QUESTION: "What is your question?" (e.g., double dose of medicine, side effect)     Given 2 3. PRESCRIBER: "Who prescribed the medicine?" Reason: if prescribed by specialist, call should be referred to that group.     PCP  Protocols used: Medication Question Call-A-AH

## 2022-05-17 NOTE — Telephone Encounter (Signed)
Pt called back, advised of only Augmentin. Pt was unsure why she got both but didn't know if she could return or not but will just keep for now. No further assistance needed.

## 2022-06-20 ENCOUNTER — Other Ambulatory Visit: Payer: Self-pay | Admitting: Family Medicine

## 2022-06-20 DIAGNOSIS — E034 Atrophy of thyroid (acquired): Secondary | ICD-10-CM

## 2022-07-17 ENCOUNTER — Ambulatory Visit: Payer: 59 | Admitting: Dermatology

## 2022-07-17 VITALS — BP 137/81 | HR 75

## 2022-07-17 DIAGNOSIS — L409 Psoriasis, unspecified: Secondary | ICD-10-CM | POA: Diagnosis not present

## 2022-07-17 DIAGNOSIS — L821 Other seborrheic keratosis: Secondary | ICD-10-CM | POA: Diagnosis not present

## 2022-07-17 DIAGNOSIS — D1801 Hemangioma of skin and subcutaneous tissue: Secondary | ICD-10-CM

## 2022-07-17 DIAGNOSIS — L578 Other skin changes due to chronic exposure to nonionizing radiation: Secondary | ICD-10-CM | POA: Diagnosis not present

## 2022-07-17 DIAGNOSIS — L570 Actinic keratosis: Secondary | ICD-10-CM | POA: Diagnosis not present

## 2022-07-17 DIAGNOSIS — L820 Inflamed seborrheic keratosis: Secondary | ICD-10-CM | POA: Diagnosis not present

## 2022-07-17 DIAGNOSIS — D229 Melanocytic nevi, unspecified: Secondary | ICD-10-CM

## 2022-07-17 DIAGNOSIS — L814 Other melanin hyperpigmentation: Secondary | ICD-10-CM | POA: Diagnosis not present

## 2022-07-17 DIAGNOSIS — Z7189 Other specified counseling: Secondary | ICD-10-CM | POA: Diagnosis not present

## 2022-07-17 DIAGNOSIS — Z1283 Encounter for screening for malignant neoplasm of skin: Secondary | ICD-10-CM

## 2022-07-17 DIAGNOSIS — L304 Erythema intertrigo: Secondary | ICD-10-CM | POA: Diagnosis not present

## 2022-07-17 DIAGNOSIS — Z79899 Other long term (current) drug therapy: Secondary | ICD-10-CM

## 2022-07-17 NOTE — Progress Notes (Signed)
Follow-Up Visit   Subjective  Gina [name] is a 67 y.o. female who presents for the following: Annual Exam. Psoriasis treating with Rutherford Nail tablets with a good response, patient concerned when she turn 89 her insurance may not cover [place]. Patient c/o joint aches on her hands and feet. The patient presents for Total-Body Skin Exam (TBSE) for skin cancer screening and mole check.  The patient has spots, moles and lesions to be evaluated, some may be new or changing and the patient has concerns that these could be cancer.   The following portions of the chart were reviewed this encounter and updated as appropriate:   Tobacco  Allergies  Meds  Problems  Med Hx  Surg Hx  Fam Hx     Review of Systems:  No other skin or systemic complaints except as noted in HPI or Assessment and Plan.  Objective  Well appearing patient in no apparent distress; mood and affect are within normal limits.  A full examination was performed including scalp, head, eyes, ears, nose, lips, neck, chest, axillae, abdomen, back, buttocks, bilateral upper extremities, bilateral lower extremities, hands, feet, fingers, toes, fingernails, and toenails. All findings within normal limits unless otherwise noted below.  inframammary Clear    forehead x 2 (2) Erythematous thin papules/macules with gritty scale.   back (2) Stuck-on, waxy, tan-brown papules--Discussed benign etiology and prognosis.   Inframammary areas Erythema     Assessment & Plan  Psoriasis inframammary With inverse psoriasis and intertrigo and possible Psoriatic arthritis-due to joint ache Psoriasis is a chronic non-curable, but treatable genetic/hereditary disease that may have other systemic features affecting other organ systems such as joints (Psoriatic Arthritis). It is associated with an increased risk of inflammatory bowel disease, heart disease, non-alcoholic fatty liver disease, and depression.     Better controlled - continue  Otezla 30 mg 1 po qd No side effects from oral R[place] Nail  May consider Otezla assistance program in the future if her insurance stop covering [place]  when she turn 82  We will send a referral to the Rheumatologist te evaluate for possible Psoriatic Arthritis  Related Procedures Ambulatory referral to Rheumatology  Related Medications OTEZLA 30 MG TABS TAKE 1 TABLET BY MOUTH 2 TIMES A DAY.  AK (actinic keratosis) (2) forehead x 2 Actinic keratoses are precancerous spots that appear secondary to cumulative UV radiation exposure/sun exposure over time. They are chronic with expected duration over 1 year. A portion of actinic keratoses will progress to squamous cell carcinoma of the skin. It is not possible to reliably predict which spots will progress to skin cancer and so treatment is recommended to prevent development of skin cancer.  Recommend daily broad spectrum sunscreen SPF 30+ to sun-exposed areas, reapply every 2 hours as needed.  Recommend staying in the shade or wearing long sleeves, sun glasses (UVA+UVB protection) and wide brim hats (4-inch brim around the entire circumference of the hat). Call for new or changing lesions.   Destruction of lesion - forehead x 2 Complexity: simple   Destruction method: cryotherapy   Informed consent: discussed and consent obtained   Timeout:  patient name, date of birth, surgical site, and procedure verified Lesion destroyed using liquid nitrogen: Yes   Region frozen until ice ball extended beyond lesion: Yes   Outcome: patient tolerated procedure well with no complications   Post-procedure details: wound care instructions given    Inflamed seborrheic keratosis (2) back Symptomatic, irritating, patient would like treated.  Destruction of lesion -  back Complexity: simple   Destruction method: cryotherapy   Informed consent: discussed and consent obtained   Timeout:  patient name, date of birth, surgical site, and procedure  verified Lesion destroyed using liquid nitrogen: Yes   Region frozen until ice ball extended beyond lesion: Yes   Outcome: patient tolerated procedure well with no complications   Post-procedure details: wound care instructions given    Intertrigo Inframammary areas With inverse psoriasis Chronic and persistent condition with duration or expected duration over one year. Condition is symptomatic / bothersome to patient. Not to goal.   Intertrigo is a chronic recurrent rash that occurs in skin fold areas that may be associated with friction; heat; moisture; yeast; fungus; and bacteria.  It is exacerbated by increased movement / activity; sweating; and higher atmospheric temperature.  Start otc Zeasorb AF powder daily   Start [facility] Iodoquinol 1%, Hydrocortisone 2.5%, Niacinamide 2% Cream twice a day to affected areas for up to two weeks.  The patient was advised this is not covered by insurance since it is made by a compounding pharmacy. They will receive an email to check out and the medication will be mailed to their home.    Lentigines - Scattered tan macules - Due to sun exposure - Benign-appearing, observe - Recommend daily broad spectrum sunscreen SPF 30+ to sun-exposed areas, reapply every 2 hours as needed. - Call for any changes  Seborrheic Keratoses - Stuck-on, waxy, tan-brown papules and/or plaques  - Benign-appearing - Discussed benign etiology and prognosis. - Observe - Call for any changes  Melanocytic Nevi - Tan-brown and/or pink-flesh-colored symmetric macules and papules - Benign appearing on exam today - Observation - Call clinic for new or changing moles - Recommend daily use of broad spectrum spf 30+ sunscreen to sun-exposed areas.   Hemangiomas - Red papules - Discussed benign nature - Observe - Call for any changes  Actinic Damage - Chronic condition, secondary to cumulative UV/sun exposure - diffuse scaly erythematous macules with underlying  dyspigmentation - Recommend daily broad spectrum sunscreen SPF 30+ to sun-exposed areas, reapply every 2 hours as needed.  - Staying in the shade or wearing long sleeves, sun glasses (UVA+UVB protection) and wide brim hats (4-inch brim around the entire circumference of the hat) are also recommended for sun protection.  - Call for new or changing lesions.  Skin cancer screening performed today.   Return in about 6 months (around 01/17/2023) for Psoriasis .  IMarye Round, CMA, am acting as scribe for Sarina Ser, MD .  Documentation: I have reviewed the above documentation for accuracy and completeness, and I agree with the above.  Sarina Ser, MD

## 2022-07-17 NOTE — Patient Instructions (Addendum)
Cryotherapy Aftercare  Wash gently with soap and water everyday.   Apply Vaseline and Band-Aid daily until healed.    Start over the [employer] AF powder daily     Instructions for [facility] Medications  One or more of your medications was sent to the [facility] mail order compounding pharmacy. You will receive an email from them and can purchase the medicine through that link. It will then be mailed to your home at the address you confirmed. If for any reason you do not receive an email from them, please check your spam folder. If you still do not find the email, please let us know. [facility] phone number is [phone].     Due to recent changes in healthcare laws, you may see results of your pathology and/or laboratory studies on MyChart before the doctors have had a chance to review them. We understand that in some cases there may be results that are confusing or concerning to you. Please understand that not all results are received at the same time and often the doctors may need to interpret multiple results in order to provide you with the best plan of care or course of treatment. Therefore, we ask that you please give [place] 2 business days to thoroughly review all your results before contacting the office for clarification. Should we see a critical lab result, you will be contacted sooner.   If You Need Anything After Your Visit  If you have any questions or concerns for your doctor, please call our main line at [phone] and press option 4 to reach your doctor's medical assistant. If no one answers, please leave a voicemail as directed and we will return your call as soon as possible. Messages left after 4 pm will be answered the following business day.   You may also send [place] a message via [place]. We typically respond to MyChart messages within 1-2 business days.  For prescription refills, please ask your pharmacy to contact our office. Our fax number is  [phone].  If you have an urgent issue when the clinic is closed that cannot wait until the next business day, you can page your doctor at the number below.    Please note that while we do our best to be available for urgent issues outside of office hours, we are not available 24/7.   If you have an urgent issue and are unable to reach [place], you may choose to seek medical care at your doctor's office, retail clinic, urgent care center, or emergency room.  If you have a medical emergency, please immediately call 911 or go to the emergency department.  Pager Numbers  - Dr. Nehemiah Massed: [phone]  - Dr. Laurence Ferrari: [phone]  - Dr. Nicole Kindred: [phone]  In the event of inclement weather, please call our main line at [phone] for an update on the status of any delays or closures.  Dermatology Medication Tips: Please keep the boxes that topical medications come in in order to help keep track of the instructions about where and how to use these. Pharmacies typically print the medication instructions only on the boxes and not directly on the medication tubes.   If your medication is too expensive, please contact our office at [phone] option 4 or send [place] a message through [place].   We are unable to tell what your co-pay for medications will be in advance as this is different depending on your insurance coverage. However, we may be able to find a substitute  medication at lower cost or fill out paperwork to get insurance to cover a needed medication.   If a prior authorization is required to get your medication covered by your insurance company, please allow [place] 1-2 business days to complete this process.  Drug prices often vary depending on where the prescription is filled and some pharmacies may offer cheaper prices.  The website www.goodrx.com contains coupons for medications through different pharmacies. The prices here do not account for what the cost may be with help from  insurance (it may be cheaper with your insurance), but the website can give you the price if you did not use any insurance.  - You can print the associated coupon and take it with your prescription to the pharmacy.  - You may also stop by our office during regular business hours and pick up a GoodRx coupon card.  - If you need your prescription sent electronically to a different pharmacy, notify our office through [facility] or by phone at [phone] option 4.     Si Usted Necesita Algo Despus de Su Visita  Tambin puede enviarnos un mensaje a travs de Pharmacist, community. Por lo general respondemos a los mensajes de MyChart en el transcurso de 1 a 2 das hbiles.  Para renovar recetas, por favor pida a su farmacia que se ponga en contacto con nuestra oficina. Harland Dingwall de fax es [place] [phone].  Si tiene un asunto urgente cuando la clnica est cerrada y que no puede esperar hasta el siguiente da hbil, puede llamar/localizar a su doctor(a) al nmero que aparece a continuacin.   Por favor, tenga en cuenta que aunque hacemos todo lo posible para estar disponibles para asuntos urgentes fuera del horario de [place], no estamos disponibles las 24 horas del da, los 7 das de la [place].   Si tiene un problema urgente y no puede comunicarse con nosotros, puede optar por buscar atencin mdica  en el consultorio de su doctor(a), en una clnica privada, en un centro de atencin urgente o en una sala de emergencias.  Si tiene Engineering geologist, por favor llame inmediatamente al 911 o vaya a la sala de emergencias.  Nmeros de bper  - Dr. Nehemiah Massed: [phone]  - Dra. Moye: [phone]  - Dra. Nicole Kindred: [phone]  En caso de inclemencias del [place], por favor llame a Johnsie Kindred principal al [phone] para una actualizacin sobre el [place] de cualquier retraso o cierre.  Consejos para la medicacin en dermatologa: Por favor, guarde las cajas en las que vienen los  medicamentos de uso tpico para ayudarle a seguir las instrucciones sobre dnde y cmo usarlos. Las farmacias generalmente imprimen las instrucciones del medicamento slo en las cajas y no directamente en los tubos del [place].   Si su medicamento es muy caro, por favor, pngase en contacto con Zigmund Daniel llamando al [phone] y presione la opcin 4 o envenos un mensaje a travs de Pharmacist, community.   No podemos decirle cul ser su copago por los medicamentos por adelantado ya que esto es diferente dependiendo de la cobertura de su seguro. Sin embargo, es posible que podamos encontrar un medicamento sustituto a Electrical engineer un formulario para que el seguro cubra el medicamento que se considera necesario.   Si se requiere una autorizacin previa para que su compaa de seguros [place] su medicamento, por favor permtanos de 1 a 2 das hbiles para completar este proceso.  Los precios de los medicamentos varan con frecuencia dependiendo del Environmental consultant de  lugar de dnde se surte la receta y alguna farmacias pueden ofrecer precios ms baratos.  El sitio web www.goodrx.com tiene cupones para medicamentos de diferentes farmacias. Los precios aqu no tienen en cuenta lo que podra costar con la ayuda del seguro (puede ser ms barato con su seguro), pero el sitio web puede darle el precio si no utiliz ningn seguro.  - Puede imprimir el cupn correspondiente y llevarlo con su receta a la farmacia.  - Tambin puede pasar por nuestra oficina durante el horario de atencin regular y recoger una tarjeta de cupones de GoodRx.  - Si necesita que su receta se enve electrnicamente a una farmacia diferente, informe a nuestra oficina a travs de MyChart de [place] o por telfono llamando al [phone] y presione la opcin 4.  

## 2022-07-18 ENCOUNTER — Encounter: Payer: Self-pay | Admitting: Dermatology

## 2022-07-19 ENCOUNTER — Other Ambulatory Visit: Payer: Self-pay | Admitting: Family Medicine

## 2022-07-19 ENCOUNTER — Other Ambulatory Visit: Payer: Self-pay | Admitting: Podiatry

## 2022-07-19 DIAGNOSIS — B372 Candidiasis of skin and nail: Secondary | ICD-10-CM

## 2022-07-20 NOTE — Telephone Encounter (Signed)
Please advise 

## 2022-07-20 NOTE — Telephone Encounter (Signed)
Requested medication (s) are due for refill today -unsure  Requested medication (s) are on the active medication list -yes  Future visit scheduled -yes  Last refill: 05/15/22 #56  Notes to clinic: off protocol - provider review   Requested Prescriptions  Pending Prescriptions Disp Refills   fluconazole (DIFLUCAN) 150 MG tablet [Pharmacy Med Name: FLUCONAZOLE 150 MG TABLET] 56 tablet 0    Sig: TAKE 2 TABLETS BY MOUTH DAILY.     Off-Protocol Failed - 07/19/2022  6:52 PM      Failed - Medication not assigned to a protocol, review manually.      Passed - Valid encounter within last 12 months    Recent Outpatient Visits           2 months ago Acute non-recurrent maxillary sinusitis   [place] Tally Joe T, FNP   4 months ago Primary hypertension   [place] Gwyneth Sprout, FNP   10 months ago Primary hypertension   [place] Gwyneth Sprout, FNP   11 months ago Annual physical exam   [facility] Gwyneth Sprout, FNP   1 year ago Postmenopausal bleeding   [place] Brita Romp, Dionne Bucy, MD       Future Appointments             In 3 weeks Gwyneth Sprout, [place], [facility]   In 6 months Ralene Bathe, MD [place]               Requested Prescriptions  Pending Prescriptions Disp Refills   fluconazole (DIFLUCAN) 150 MG tablet [Pharmacy Med Name: FLUCONAZOLE 150 MG TABLET] 56 tablet 0    Sig: TAKE 2 TABLETS BY MOUTH DAILY.     Off-Protocol Failed - 07/19/2022  6:52 PM      Failed - Medication not assigned to a protocol, review manually.      Passed - Valid encounter within last 12 months    Recent Outpatient Visits           2 months ago Acute non-recurrent maxillary sinusitis   [place] Tally Joe T, FNP   4 months ago Primary  hypertension   [place] Gwyneth Sprout, FNP   10 months ago Primary hypertension   [place] Gwyneth Sprout, FNP   11 months ago Annual physical exam   [place] [facility] Gwyneth Sprout, FNP   1 year ago Postmenopausal bleeding   [place], Dionne Bucy, MD       Future Appointments             In 3 weeks Gwyneth Sprout, [place], [place]   In 6 months Ralene Bathe, MD [place]

## 2022-07-24 ENCOUNTER — Telehealth: Payer: Self-pay | Admitting: Podiatry

## 2022-07-24 MED ORDER — CELECOXIB 100 MG PO CAPS: 100.0000 mg | ORAL_CAPSULE | Freq: Two times a day (BID) | ORAL | 2 refills | Status: DC

## 2022-07-24 NOTE — Telephone Encounter (Signed)
Spoke to the patient and she confirmed that the Celebrex is the right medication and she didn't know. Patient is going to make an appt to be seen as well.

## 2022-07-24 NOTE — Addendum Note (Signed)
Addended bySherryle Lis, Ameliya Nicotra R on: 07/24/2022 05:01 PM   Modules accepted: Orders

## 2022-07-24 NOTE — Telephone Encounter (Signed)
Please resend medication celebrex to   [id number] [place]  [address] [place], [place] [address]

## 2022-07-24 NOTE — Telephone Encounter (Signed)
Looks like the Celebrex medication but the patient has not been seen in a year. Do you want the patient to schedule an appt before refills are given.

## 2022-07-27 NOTE — Telephone Encounter (Signed)
Pt stated kind back and [facility] w/ medication fluconazole and Otezla. Pt still requesting refill.

## 2022-07-28 NOTE — Telephone Encounter (Signed)
Pt verbalized understanding.

## 2022-08-07 ENCOUNTER — Encounter: Payer: 59 | Admitting: Family Medicine
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
## 2022-08-16 ENCOUNTER — Encounter: Payer: Self-pay | Admitting: Family Medicine

## 2022-08-16 ENCOUNTER — Ambulatory Visit (INDEPENDENT_AMBULATORY_CARE_PROVIDER_SITE_OTHER): Payer: 59 | Admitting: Family Medicine

## 2022-08-16 VITALS — BP 131/81 | HR 63 | Temp 98.1°F | Ht 61.0 in | Wt 143.8 lb

## 2022-08-16 DIAGNOSIS — Z0000 Encounter for general adult medical examination without abnormal findings: Secondary | ICD-10-CM

## 2022-08-17 LAB — LIPID PANEL
Chol/HDL Ratio: 3.4 ratio (ref 0.0–4.4)
Cholesterol, Total: 219 mg/dL — ABNORMAL HIGH (ref 100–199)
HDL: 65 mg/dL (ref >39)
LDL Chol Calc (NIH): 137 mg/dL — ABNORMAL HIGH (ref 0–99)
Triglycerides: 97 mg/dL (ref 0–149)
VLDL Cholesterol Cal: 17 mg/dL (ref 5–40)

## 2022-08-17 LAB — CBC WITH DIFFERENTIAL/PLATELET
Basophils Absolute: 0 10*3/uL (ref 0.0–0.2)
Basos: 1 %
EOS (ABSOLUTE): 0.3 10*3/uL (ref 0.0–0.4)
Eos: 6 %
Hematocrit: 41 % (ref 34.0–46.6)
Hemoglobin: 13.8 g/dL (ref 11.1–15.9)
Immature Grans (Abs): 0 10*3/uL (ref 0.0–0.1)
Immature Granulocytes: 0 %
Lymphocytes Absolute: 1.1 10*3/uL (ref 0.7–3.1)
Lymphs: 24 %
MCH: 31.3 pg (ref 26.6–33.0)
MCHC: 33.7 g/dL (ref 31.5–35.7)
MCV: 93 fL (ref 79–97)
Monocytes Absolute: 0.3 10*3/uL (ref 0.1–0.9)
Monocytes: 7 %
Neutrophils Absolute: 2.8 10*3/uL (ref 1.4–7.0)
Neutrophils: 62 %
Platelets: 283 10*3/uL (ref 150–450)
RBC: 4.41 x10E6/uL (ref 3.77–5.28)
RDW: 13.2 % (ref 11.7–15.4)
WBC: 4.5 10*3/uL (ref 3.4–10.8)

## 2022-08-17 LAB — COMPREHENSIVE METABOLIC PANEL
ALT: 15 IU/L (ref 0–32)
AST: 21 IU/L (ref 0–40)
Albumin/Globulin Ratio: 1.8 (ref 1.2–2.2)
Albumin: 4.4 g/dL (ref 3.9–4.9)
Alkaline Phosphatase: 95 IU/L (ref 44–121)
BUN/Creatinine Ratio: 16 (ref 12–28)
BUN: 11 mg/dL (ref 8–27)
Bilirubin Total: 0.3 mg/dL (ref 0.0–1.2)
CO2: 23 mmol/L (ref 20–29)
Calcium: 9.6 mg/dL (ref 8.7–10.3)
Chloride: 107 mmol/L — ABNORMAL HIGH (ref 96–106)
Creatinine, Ser: 0.68 mg/dL (ref 0.57–1.00)
Globulin, Total: 2.5 g/dL (ref 1.5–4.5)
Glucose: 94 mg/dL (ref 70–99)
Potassium: 3.9 mmol/L (ref 3.5–5.2)
Sodium: 144 mmol/L (ref 134–144)
Total Protein: 6.9 g/dL (ref 6.0–8.5)
eGFR: 97 mL/min/{1.73_m2} (ref >59)

## 2022-08-17 LAB — TSH+FREE T4
Free T4: 1.22 ng/dL (ref 0.82–1.77)
TSH: 2.01 u[IU]/mL (ref 0.450–4.500)

## 2022-08-17 NOTE — Progress Notes (Signed)
Increase noted in cholesterol from previous sampling; The 10-year ASCVD risk score (Arnett DK, et al., 2019) is: 6.8%. Recommendation to start medication given moderate risk factor at 7%. Please let us know if you wish to start lipitor 40 mg daily.   Values used to calculate the score:     Age: 67 years     Sex: Female     Is Non-Hispanic African American: No     Diabetic: No     Tobacco smoker: No     Systolic Blood Pressure: 131 mmHg     Is BP treated: Yes     HDL Cholesterol: 65 mg/dL     Total Cholesterol: 219 mg/dL I continue to recommend diet low in saturated fat and regular exercise - 30 min at least 5 times per week

## 2022-08-18 ENCOUNTER — Telehealth: Payer: Self-pay

## 2022-08-18 ENCOUNTER — Encounter: Payer: Self-pay | Admitting: Family Medicine

## 2022-08-18 ENCOUNTER — Other Ambulatory Visit: Payer: Self-pay

## 2022-08-18 MED ORDER — ATORVASTATIN CALCIUM 40 MG PO TABS: 40.0000 mg | ORAL_TABLET | Freq: Every day | ORAL | 3 refills | Status: DC

## 2022-08-18 NOTE — Telephone Encounter (Signed)
Copied from CRM [id number]. Topic: General - Other >> Aug 18, 2022 10:21 AM Marlow Baars wrote: Reason for CRM: The patient called in to speak with someone about  her lab results from Wednesday 4/10 and was told to call if she had any questions. Please assist patient further

## 2022-08-18 NOTE — Assessment & Plan Note (Signed)
Continue to recommend vision and dental screening Things to do to keep yourself healthy  - Exercise at least 30-45 minutes a day, 3-4 days a week.  - Eat a low-fat diet with lots of fruits and vegetables, up to 7-9 servings per day.  - Seatbelts can save your life. Wear them always.  - Smoke detectors on every level of your home, check batteries every year.  - Eye Doctor - have an eye exam every 1-2 years  - Safe sex - if you may be exposed to STDs, use a condom.  - Alcohol -  If you drink, do it moderately, less than 2 drinks per day.  - Health Care Power of Attorney. Choose someone to speak for you if you are not able.  - Depression is common in our stressful world.If you're feeling down or losing interest in things you normally enjoy, please come in for a visit.  - Violence - If anyone is threatening or hurting you, please call immediately.

## 2022-08-21 ENCOUNTER — Encounter: Payer: Self-pay | Admitting: Podiatry

## 2022-08-21 ENCOUNTER — Ambulatory Visit: Payer: 59 | Admitting: Podiatry

## 2022-08-21 VITALS — BP 151/76 | HR 66

## 2022-08-21 DIAGNOSIS — L603 Nail dystrophy: Secondary | ICD-10-CM | POA: Diagnosis not present

## 2022-08-21 DIAGNOSIS — B351 Tinea unguium: Secondary | ICD-10-CM | POA: Diagnosis not present

## 2022-08-21 DIAGNOSIS — M778 Other enthesopathies, not elsewhere classified: Secondary | ICD-10-CM

## 2022-08-21 DIAGNOSIS — M25571 Pain in right ankle and joints of right foot: Secondary | ICD-10-CM | POA: Diagnosis not present

## 2022-08-22 ENCOUNTER — Encounter: Payer: Self-pay | Admitting: Family Medicine

## 2022-08-22 ENCOUNTER — Other Ambulatory Visit: Payer: Self-pay | Admitting: Family Medicine

## 2022-08-22 DIAGNOSIS — E034 Atrophy of thyroid (acquired): Secondary | ICD-10-CM

## 2022-08-22 MED ORDER — LEVOTHYROXINE SODIUM 25 MCG PO TABS: ORAL_TABLET | ORAL | 3 refills | Status: DC

## 2022-08-22 MED ORDER — AMLODIPINE BESYLATE 10 MG PO TABS: 10.0000 mg | ORAL_TABLET | Freq: Every day | ORAL | 3 refills | Status: DC

## 2022-08-22 NOTE — Progress Notes (Signed)
  Subjective:  Patient ID: Gina [name], female    DOB: 10-09-1955,  MRN: [id number]  Chief Complaint  Patient presents with   Nail Problem    "My big toenail is split and I need my yearly medication refill, Celebrex." N - toenail splitting L - hallux rt D - couple yrs O - gradually worse C - split A - catch on socks T - pedicure, they usually put glue on it to hold it down.    67 y.o. female presents with the above complaint. History confirmed with patient.  She returns for follow-up, the Celebrex has helped quite a bit.  Some of the pain is towards the front of the ankle now.  Notes that it occasionally has shooting burning pain at night.  The right great toenail has split and pulled up  Objective:  Physical Exam: warm, good capillary refill, no trophic changes or ulcerative lesions, normal DP and PT pulses, and normal sensory exam.  Right hallux nail medial corner is split and dystrophic with subungual debris and discoloration  Right Foot: Pain and tenderness in the sinus tarsi, minimal, some pain over the TA tendon, nonspecific  Assessment:   1. 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 Capsulitis of right foot      Plan:  Patient was evaluated and treated and all questions answered.  Refill of Celebrex has been sent.  She will continue to utilize this as needed.  Sinus tarsi was not particularly inflamed today to necessitate corticosteroid injection.  Some of the symptoms she may be having could also be consistent with a peripheral neuropathy.  She has had gabapentin before but had some adverse effects for it.  Will consider if she needs this again in the future.  Right great toenail has split and show signs of dystrophy and discoloration, I recommended evaluating for onychomycosis.  Nail sample was sent to the lab for analysis.  I will let her know the results of this and how we would like to treat further.  Return if symptoms worsen or fail to  improve.

## 2022-08-23 ENCOUNTER — Telehealth: Payer: Self-pay

## 2022-08-23 DIAGNOSIS — G3184 Mild cognitive impairment, so stated: Secondary | ICD-10-CM | POA: Diagnosis not present

## 2022-08-23 DIAGNOSIS — E039 Hypothyroidism, unspecified: Secondary | ICD-10-CM | POA: Diagnosis not present

## 2022-08-23 DIAGNOSIS — Z791 Long term (current) use of non-steroidal anti-inflammatories (NSAID): Secondary | ICD-10-CM | POA: Diagnosis not present

## 2022-08-23 DIAGNOSIS — M810 Age-related osteoporosis without current pathological fracture: Secondary | ICD-10-CM | POA: Diagnosis not present

## 2022-08-23 DIAGNOSIS — K219 Gastro-esophageal reflux disease without esophagitis: Secondary | ICD-10-CM | POA: Diagnosis not present

## 2022-08-23 DIAGNOSIS — L400 Psoriasis vulgaris: Secondary | ICD-10-CM | POA: Diagnosis not present

## 2022-08-23 DIAGNOSIS — B369 Superficial mycosis, unspecified: Secondary | ICD-10-CM | POA: Diagnosis not present

## 2022-08-23 DIAGNOSIS — M79673 Pain in unspecified foot: Secondary | ICD-10-CM | POA: Diagnosis not present

## 2022-08-23 DIAGNOSIS — I10 Essential (primary) hypertension: Secondary | ICD-10-CM | POA: Diagnosis not present

## 2022-08-23 DIAGNOSIS — E785 Hyperlipidemia, unspecified: Secondary | ICD-10-CM | POA: Diagnosis not present

## 2022-08-23 DIAGNOSIS — G4700 Insomnia, unspecified: Secondary | ICD-10-CM | POA: Diagnosis not present

## 2022-08-23 DIAGNOSIS — J309 Allergic rhinitis, unspecified: Secondary | ICD-10-CM | POA: Diagnosis not present

## 2022-08-24 ENCOUNTER — Other Ambulatory Visit: Payer: Self-pay | Admitting: Family Medicine

## 2022-08-24 ENCOUNTER — Telehealth: Payer: Self-pay

## 2022-08-24 MED ORDER — AMLODIPINE BESYLATE 2.5 MG PO TABS: 7.5000 mg | ORAL_TABLET | Freq: Every day | ORAL | 3 refills | Status: DC

## 2022-08-24 NOTE — Telephone Encounter (Signed)
Patient called. Patient expressed that she would hold on to the  of amLODipine for blood pressure spikes or would [facility] depending on how her next appt goes. Her PCP, Merita Norton FNP sent in a script with 3 refills on 08/24/2022

## 2022-08-24 NOTE — Telephone Encounter (Signed)
Patient called. Patient expressed that she would hold on to the 10mg of amLODipine for blood pressure spikes or would discard it [facility] depending on how her next appt goes. Her PCP, Elise Payne FNP sent in a script with 3 refills on 08/24/2022   

## 2022-08-28 ENCOUNTER — Other Ambulatory Visit: Payer: Self-pay | Admitting: Family Medicine

## 2022-08-28 DIAGNOSIS — Z111 Encounter for screening for respiratory tuberculosis: Secondary | ICD-10-CM | POA: Diagnosis not present

## 2022-08-28 DIAGNOSIS — I10 Essential (primary) hypertension: Secondary | ICD-10-CM

## 2022-08-28 DIAGNOSIS — M19041 Primary osteoarthritis, right hand: Secondary | ICD-10-CM | POA: Diagnosis not present

## 2022-08-28 DIAGNOSIS — M19042 Primary osteoarthritis, left hand: Secondary | ICD-10-CM | POA: Diagnosis not present

## 2022-08-28 DIAGNOSIS — M19071 Primary osteoarthritis, right ankle and foot: Secondary | ICD-10-CM | POA: Diagnosis not present

## 2022-08-28 DIAGNOSIS — L409 Psoriasis, unspecified: Secondary | ICD-10-CM | POA: Diagnosis not present

## 2022-08-28 DIAGNOSIS — Z7960 Long term (current) use of unspecified immunomodulators and immunosuppressants: Secondary | ICD-10-CM | POA: Diagnosis not present

## 2022-08-28 DIAGNOSIS — L4050 Arthropathic psoriasis, unspecified: Secondary | ICD-10-CM | POA: Diagnosis not present

## 2022-08-28 DIAGNOSIS — M19072 Primary osteoarthritis, left ankle and foot: Secondary | ICD-10-CM | POA: Diagnosis not present

## 2022-08-30 ENCOUNTER — Other Ambulatory Visit: Payer: Self-pay | Admitting: Family Medicine

## 2022-09-11 ENCOUNTER — Telehealth: Payer: Self-pay | Admitting: *Deleted

## 2022-09-11 NOTE — Telephone Encounter (Signed)
Patient is requesting results from nail sample that was supposed to be sent on 08/21/22 but not seeing an order for labs or that it was sent or where it was sent, only documented in epic notes that it was going to be sent. Please advise.

## 2022-09-11 NOTE — Telephone Encounter (Signed)
[facility] and they do not have any record of the nail sample being sent, may have to recollect and resend.

## 2022-09-13 ENCOUNTER — Other Ambulatory Visit: Payer: Self-pay

## 2022-09-13 DIAGNOSIS — B351 Tinea unguium: Secondary | ICD-10-CM

## 2022-09-13 NOTE — Telephone Encounter (Signed)
Patient called asking about her results of BAKO. Patient states she hasn't heard anything just wanted to see. Talked with Dr Lilian Kapur, pt needs to come back in for sample no charge copay. Called pt and let her know she will stop back in today to give another sample. Dr Lilian Kapur aware.

## 2022-09-21 DIAGNOSIS — K080 Exfoliation of teeth due to systemic causes: Secondary | ICD-10-CM | POA: Diagnosis not present

## 2022-09-22 ENCOUNTER — Encounter: Payer: Self-pay | Admitting: Family Medicine

## 2022-09-22 ENCOUNTER — Ambulatory Visit (INDEPENDENT_AMBULATORY_CARE_PROVIDER_SITE_OTHER): Payer: Medicare Other | Admitting: Family Medicine

## 2022-09-22 VITALS — BP 125/75 | HR 74 | Ht 61.5 in | Wt 143.0 lb

## 2022-09-22 DIAGNOSIS — I860 Sublingual varices: Secondary | ICD-10-CM | POA: Diagnosis not present

## 2022-09-22 NOTE — Progress Notes (Signed)
    Established patient visit  Patient: Gina [name]   DOB: [birth year]   67 y.o. Female  MRN: [id number] Visit Date: 09/22/2022  Today's healthcare provider: Jacky Kindle, FNP  Re Introduced to nurse practitioner role and practice setting.  All questions answered.  Discussed provider/patient relationship and expectations.  Chief Complaint  Patient presents with   red spot   Subjective    HPI HPI   Red spot-underneath the tongue. Denied pain/fever. Last edited by Shelly Bombard, CMA on 09/22/2022  1:05 PM.      Medications: Outpatient Medications Prior to Visit  Medication Sig   ALPRAZolam (XANAX) 0.25 MG tablet Take 1 tablet (0.25 mg total) by mouth 2 (two) times daily as needed for anxiety.   amLODipine (NORVASC) 2.5 MG tablet Take 3 tablets (7.5 mg total) by mouth daily.   atorvastatin (LIPITOR) 40 MG tablet Take 1 tablet (40 mg total) by mouth daily.   calcipotriene (DOVONOX) 0.005 % cream Apply topically daily. To psoriasis as needed 7 days a week   celecoxib (CELEBREX) 100 MG capsule Take 1 capsule (100 mg total) by mouth 2 (two) times daily.   cetirizine (ZYRTEC) 10 MG tablet Take 10 mg by mouth daily as needed for allergies.   Cholecalciferol (VITAMIN D3) 1000 UNITS CAPS Take 1,000 Units by mouth at bedtime.    fluconazole (DIFLUCAN) 150 MG tablet Take 2 tablets (300 mg total) by mouth daily.   fluticasone (FLONASE) 50 MCG/ACT nasal spray Place 2 sprays into both nostrils daily as needed for rhinitis.   Glycopyrronium Tosylate (QBREXZA) 2.4 % PADS Apply 1 application  topically daily.   Halobetasol Propionate (BRYHALI) 0.01 % LOTN Apply 1 application. topically daily. Up to 5 days per week per flare   ketoconazole (NIZORAL) 2 % cream APPLY UNDER THE BREAST DAILY   levothyroxine (SYNTHROID) 25 MCG tablet TAKE 1 TABLET BY MOUTH EVERY DAY BEFORE BREAKFAST   OTEZLA 30 MG TABS TAKE 1 TABLET BY MOUTH 2 TIMES A DAY.   No facility-administered medications prior to visit.     Review of Systems    Objective    BP 125/75 (BP Location: Right Arm, Patient Position: Sitting, Cuff Size: Normal)   Pulse 74   Ht 5' 1.5" (1.562 m)   Wt 143 lb (64.9 kg)   SpO2 100%   BMI 26.58 kg/m   Physical Exam   No results found for any visits on 09/22/22.  Assessment & Plan     Problem List Items Addressed This Visit       Cardiovascular and Mediastinum   Sublingual varices - Primary    Acute, unknown length of time Noted at dentist at last cleaning Denies fevers, chills, or other systemic symptoms Encouraged to continue to monitor Can refer to ENT as needed       Return if symptoms worsen or fail to improve.     Leilani Merl, FNP, have reviewed all documentation for this visit. The documentation on 09/22/22 for the exam, diagnosis, procedures, and orders are all accurate and complete.  Jacky Kindle, FNP  [facility] [phone] (phone) [phone] (fax)  [facility]

## 2022-09-22 NOTE — Assessment & Plan Note (Signed)
Acute, unknown length of time Noted at dentist at last cleaning Denies fevers, chills, or other systemic symptoms Encouraged to continue to monitor Can refer to ENT as needed

## 2022-09-26 NOTE — Telephone Encounter (Signed)
Patient is calling for nail results that were re clipped. Explained that the results are not back as of yet. She is wanting to know which labs it was sent. Please advise.

## 2022-10-03 ENCOUNTER — Encounter: Payer: Self-pay | Admitting: Podiatry

## 2022-11-17 ENCOUNTER — Ambulatory Visit: Payer: Self-pay | Admitting: *Deleted

## 2022-11-17 ENCOUNTER — Other Ambulatory Visit: Payer: Self-pay | Admitting: Family Medicine

## 2022-11-17 DIAGNOSIS — B372 Candidiasis of skin and nail: Secondary | ICD-10-CM

## 2022-11-17 MED ORDER — FLUCONAZOLE 150 MG PO TABS
300.0000 mg | ORAL_TABLET | Freq: Every day | ORAL | 0 refills | Status: DC
Start: 2022-11-17 — End: 2023-05-03

## 2022-11-17 NOTE — Telephone Encounter (Signed)
Patient advised and as it turned out she still had a prescription from January

## 2022-11-17 NOTE — Telephone Encounter (Signed)
Summary: worsening rash   Rash under breast and vaginal area, comes and goes but last few days has gotten worse, red.           Chief Complaint: rash, medication questions Symptoms: red rash under bilateral breasts again, and irritated red rash to vaginal area , painful at times, looks "irritated". [place] and seems rash getting worse. Has been using previous prescribed creams with some relief. Frequency: approx 1 week  Pertinent Negatives: Patient denies fever Disposition: [] ED /[] Urgent Care (no appt availability in office) / [] Appointment(In office/virtual)/ []  [place] [facility]/ [] Home Care/ [x] Refused Recommended Disposition /[]  Mobile Bus/ []  Follow-up with PCP Additional Notes:   Recommended PCP will probably want to see rash. Patient reports rash is similar to other times PCP has seen it. Requesting if PCP recommends for patient to take fluconazole 150 mg 2 tablets daily again. Patient reports she has not been taking fluconazole daily since rash went away when initially prescribed . Please advise .          Reason for Disposition  Genital area rash  Answer Assessment - Initial Assessment Questions 1. APPEARANCE of RASH: "Describe the rash."      Red rash under breast and vaginal area 2. LOCATION: "Where is the rash located?"      Under breasts and vaginal area 3. NUMBER: "How many spots are there?"      Na  4. SIZE: "How big are the spots?" (Inches, centimeters or compare to size of a coin)      Na  5. ONSET: "When did the rash start?"      Approx 1 week ago worse after getting in "[place]" 6. ITCHING: "Does the rash itch?" If Yes, ask: "How bad is the itch?"  (Scale 0-10; or none, mild, moderate, severe)     "Irritated" 7. PAIN: "Does the rash hurt?" If Yes, ask: "How bad is the pain?"  (Scale 0-10; or none, mild, moderate, severe)    - NONE (0): no pain    - MILD (1-3): doesn't interfere with normal activities     - MODERATE (4-7): interferes  with normal activities or awakens from sleep     - SEVERE (8-10): excruciating pain, unable to do any normal activities     Yes  8. OTHER SYMPTOMS: "Do you have any other symptoms?" (e.g., fever)     Reports same type of rash as she has had before 9. PREGNANCY: "Is there any chance you are pregnant?" "When was your last menstrual period?"     na  Protocols used: Rash or Redness - Localized-A-AH

## 2022-11-22 ENCOUNTER — Ambulatory Visit: Payer: Self-pay | Admitting: *Deleted

## 2022-11-22 NOTE — Telephone Encounter (Signed)
Message from Ralston M sent at 11/22/2022  9:50 AM EDT  Summary: med management   Pt stated the pharmacist alarmed her that if she takes the cholesterol medication atorvastatin (LIPITOR) 40 MG tablet, is unsure if she should take the medication fluconazole (DIFLUCAN) 150 MG tablet.  Stated has questions about interactions.   Seeking clinical advice.          Call History  Contact Date/Time Type Contact Phone/Fax User  11/22/2022 09:48 AM EDT Phone (Incoming) Dolle, Conswella Bruney (Self) [phone] Judie Petit) McGill, Darlina Rumpf

## 2022-11-22 NOTE — Telephone Encounter (Signed)
See pt message regarding Lipitor and taking it with Diflucan.

## 2022-11-27 NOTE — Telephone Encounter (Signed)
Patient advised. Verbalized understanding 

## 2022-11-28 ENCOUNTER — Encounter: Payer: Self-pay | Admitting: Podiatry

## 2022-12-08 ENCOUNTER — Other Ambulatory Visit: Payer: Self-pay | Admitting: Family Medicine

## 2022-12-08 DIAGNOSIS — F411 Generalized anxiety disorder: Secondary | ICD-10-CM

## 2022-12-08 NOTE — Telephone Encounter (Signed)
Requested medication (s) are due for refill today - yes  Requested medication (s) are on the active medication list -yes  Future visit scheduled -no  Last refill: 05/15/22 #60 5RF  Notes to clinic: non delegated Rx  Requested Prescriptions  Pending Prescriptions Disp Refills   ALPRAZolam (XANAX) 0.25 MG tablet [Pharmacy Med Name: ALPRAZOLAM 0.25MG  TABLETS] 180 tablet     Sig: TAKE 1 TABLET BY MOUTH TWICE DAILY AS NEEDED FOR ANXIETY     Not Delegated - Psychiatry: Anxiolytics/Hypnotics 2 Failed - 12/08/2022 10:49 AM      Failed - This refill cannot be delegated      Failed - Urine Drug Screen completed in last 360 days      Passed - Patient is not pregnant      Passed - Valid encounter within last 6 months    Recent Outpatient Visits           2 months 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   [facility] Jacky Kindle, FNP   3 months ago Annual physical exam   [facility] Merita Norton T, FNP   6 months ago Acute non-recurrent maxillary sinusitis   [place] [facility] Jacky Kindle, FNP   9 months ago Primary hypertension   [place] [facility] Merita Norton T, FNP   1 year ago Primary hypertension   [place] [facility] Jacky Kindle, FNP       Future Appointments             In 1 month Deirdre Evener, MD [facility]               Requested Prescriptions  Pending Prescriptions Disp Refills   ALPRAZolam Prudy Feeler) 0.25 MG tablet [Pharmacy Med Name: ALPRAZOLAM 0.25MG  TABLETS] 180 tablet     Sig: TAKE 1 TABLET BY MOUTH TWICE DAILY AS NEEDED FOR ANXIETY     Not Delegated - Psychiatry: Anxiolytics/Hypnotics 2 Failed - 12/08/2022 10:49 AM      Failed - This refill cannot be delegated      Failed - Urine Drug Screen completed in last 360 days      Passed - Patient is not pregnant      Passed - Valid encounter within last 6 months    Recent Outpatient  Visits           2 months 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   [place] [facility] Jacky Kindle, FNP   3 months ago Annual physical exam   [facility] Merita Norton T, FNP   6 months ago Acute non-recurrent maxillary sinusitis   [facility] Jacky Kindle, FNP   9 months ago Primary hypertension   [facility] Merita Norton T, FNP   1 year ago Primary hypertension   [place] [facility] Jacky Kindle, FNP       Future Appointments             In 1 month Deirdre Evener, MD [place] [facility]

## 2022-12-25 ENCOUNTER — Telehealth: Payer: Self-pay | Admitting: Podiatry

## 2022-12-25 ENCOUNTER — Telehealth: Payer: Self-pay

## 2022-12-25 NOTE — Telephone Encounter (Signed)
Patient called and left a message - she has questions about billing - her insurance changed in May - please call - thanks [phone]

## 2022-12-25 NOTE — Telephone Encounter (Signed)
Pt called and she is getting  a statement from a pathology company [facility] and she is confused because she has also gotten a bill from [facility] for 500.00. The first nail sample was lost and when she came in to do the second sample of nail she said Dr Vara Guardian nurse did say she was going to send it somewhere else. She is not sure what she needs to do. Please help her fiqure out the billing for the nail sample.

## 2022-12-25 NOTE — Telephone Encounter (Signed)
Notified pt that [facility] was the new lab that her sample was sent to and if she owes anything they will send her a bill

## 2022-12-28 ENCOUNTER — Encounter: Payer: Self-pay | Admitting: Family Medicine

## 2022-12-28 ENCOUNTER — Ambulatory Visit (INDEPENDENT_AMBULATORY_CARE_PROVIDER_SITE_OTHER): Payer: Medicare Other | Admitting: Family Medicine

## 2022-12-28 VITALS — BP 133/75 | HR 75 | Ht 61.0 in | Wt 144.7 lb

## 2022-12-28 DIAGNOSIS — J069 Acute upper respiratory infection, unspecified: Secondary | ICD-10-CM | POA: Diagnosis not present

## 2022-12-28 DIAGNOSIS — N898 Other specified noninflammatory disorders of vagina: Secondary | ICD-10-CM | POA: Diagnosis not present

## 2022-12-28 DIAGNOSIS — J392 Other diseases of pharynx: Secondary | ICD-10-CM | POA: Insufficient documentation

## 2022-12-28 MED ORDER — AQUAPHOR EX OINT: TOPICAL_OINTMENT | CUTANEOUS | Status: DC | PRN

## 2022-12-28 MED ORDER — NYSTATIN 100000 UNIT/ML MT SUSP: 5.0000 mL | Freq: Four times a day (QID) | OROMUCOSAL | 0 refills | Status: DC | PRN

## 2022-12-28 MED ORDER — HYDROCORTISONE 1 % EX OINT: 1.0000 | TOPICAL_OINTMENT | Freq: Two times a day (BID) | CUTANEOUS | Status: DC

## 2022-12-28 NOTE — Progress Notes (Signed)
Established patient visit   Patient: Gina [name]   DOB: 10-May-1955   67 y.o. Female  MRN: [id number] Visit Date: 12/28/2022  Today's healthcare provider: Jacky Kindle, FNP  Introduced to nurse practitioner role and practice setting.  All questions answered.  Discussed provider/patient relationship and expectations.  Subjective    HPI HPI   Patient present due to bumps in throat + Female problem. Started as cough and congestion associated with fatigue last week and was taking zyrtec. Patient also reports she used powder in vaginal area and caused itching. Used destin ointment on it to try and help Last edited by Acey Lav, CMA on 12/28/2022 11:02 AM.      Medications: Outpatient Medications Prior to Visit  Medication Sig   ALPRAZolam (XANAX) 0.25 MG tablet TAKE 1 TABLET BY MOUTH TWICE DAILY AS NEEDED FOR ANXIETY   amLODipine (NORVASC) 2.5 MG tablet Take 3 tablets (7.5 mg total) by mouth daily.   atorvastatin (LIPITOR) 40 MG tablet Take 1 tablet (40 mg total) by mouth daily.   calcipotriene (DOVONOX) 0.005 % cream Apply topically daily. To psoriasis as needed 7 days a week   celecoxib (CELEBREX) 100 MG capsule Take 1 capsule (100 mg total) by mouth 2 (two) times daily.   cetirizine (ZYRTEC) 10 MG tablet Take 10 mg by mouth daily as needed for allergies.   Cholecalciferol (VITAMIN D3) 1000 UNITS CAPS Take 1,000 Units by mouth at bedtime.    fluconazole (DIFLUCAN) 150 MG tablet Take 2 tablets (300 mg total) by mouth daily.   fluticasone (FLONASE) 50 MCG/ACT nasal spray Place 2 sprays into both nostrils daily as needed for rhinitis.   Glycopyrronium Tosylate (QBREXZA) 2.4 % PADS Apply 1 application  topically daily.   Halobetasol Propionate (BRYHALI) 0.01 % LOTN Apply 1 application. topically daily. Up to 5 days per week per flare   ketoconazole (NIZORAL) 2 % cream APPLY UNDER THE BREAST DAILY   levothyroxine (SYNTHROID) 25 MCG tablet TAKE 1 TABLET BY MOUTH EVERY DAY  BEFORE BREAKFAST   OTEZLA 30 MG TABS TAKE 1 TABLET BY MOUTH 2 TIMES A DAY.   No facility-administered medications prior to visit.     Objective    BP 133/75 (BP Location: Left Arm, Patient Position: Sitting, Cuff Size: Normal)   Pulse 75   Ht 5\' 1"  (1.549 m)   Wt 144 lb 11.2 oz (65.6 kg)   BMI 27.34 kg/m   Physical Exam Vitals and nursing note reviewed.  Constitutional:      General: She is not in acute distress.    Appearance: Normal appearance. She is overweight. She is not ill-appearing, toxic-appearing or diaphoretic.  HENT:     Head: Normocephalic and atraumatic.     Right Ear: Tympanic membrane, ear canal and external ear normal.     Left Ear: Tympanic membrane, ear canal and external ear normal.     Nose: Nose normal.     Mouth/Throat:     Mouth: Mucous membranes are moist. Oral lesions present.     Pharynx: Oropharynx is clear. Postnasal drip present. No pharyngeal swelling or posterior oropharyngeal erythema.     Tonsils: No tonsillar exudate or tonsillar abscesses.      Comments: Pt reports posterior upper rood of mouth lesions; unable to see with lighted exam  Eyes:     Extraocular Movements: Extraocular movements intact.     Conjunctiva/sclera: Conjunctivae normal.  Cardiovascular:     Rate and Rhythm: Normal rate and regular rhythm.  Pulses: Normal pulses.     Heart sounds: Normal heart sounds. No murmur heard.    No friction rub. No gallop.  Pulmonary:     Effort: Pulmonary effort is normal. No respiratory distress.     Breath sounds: Normal breath sounds. No stridor. No wheezing, rhonchi or rales.  Chest:     Chest wall: No tenderness.  Musculoskeletal:        General: No swelling, tenderness, deformity or signs of injury. Normal range of motion.     Cervical back: Normal range of motion and neck supple.     Right lower leg: No edema.     Left lower leg: No edema.  Skin:    General: Skin is warm and dry.     Capillary Refill: Capillary refill takes  less than 2 seconds.     Coloration: Skin is not jaundiced or pale.     Findings: No bruising, erythema, lesion or rash.  Neurological:     General: No focal deficit present.     Mental Status: She is alert and oriented to person, place, and time. Mental status is at baseline.     Cranial Nerves: No cranial nerve deficit.     Sensory: No sensory deficit.     Motor: No weakness.     Coordination: Coordination normal.  Psychiatric:        Mood and Affect: Mood normal.        Behavior: Behavior normal.        Thought Content: Thought content normal.        Judgment: Judgment normal.     No results found for any visits on 12/28/22.  Assessment & Plan     Problem List Items Addressed This Visit       Respiratory   Viral upper respiratory tract infection    Acute viral illness x2-3 weeks ago Pt continues to have some malaise and fatigue Continue supportive care Defer covid testing given length of symptoms and no fevers Your symptoms and exam findings are most consistent with a viral upper respiratory infection. These usually run their course in 5-7 days. Unfortunately, antibiotics don't work against viruses and just increase your risk of other issues such as diarrhea, yeast infections, and resistant infections.  If you start feeling worse with facial pain, high fever, cough, shortness of breath or start feeling significantly worse, please call us right away to be further evaluated.  Some things that can make you feel better are: - Increased rest - Increasing Fluids - Acetaminophen / ibuprofen as needed for fever/pain.  - Salt water gargling, chloraseptic spray and throat lozenges - OTC pseudoephedrine.  - Mucinex.  - Saline sinus flushes or a neti pot.  - Humidifying the air.       Relevant Medications   magic mouthwash (nystatin, lidocaine, diphenhydrAMINE, alum & mag hydroxide) suspension     Genitourinary   Vaginal dryness    Acute, iso change of products Recommend  topical moistening and anti itch creams  Continue to monitor         Other   Throat irritation - Primary    Acute, stable Reports "bumps" however, not are visible to the eye Defer swab given normal exam and no fevers or known exposures Recommend trial of magic mouthwash to assist       Return if symptoms worsen or fail to improve.     Leilani Merl, FNP, have reviewed all documentation for this visit. The documentation on 12/28/22  for the exam, diagnosis, procedures, and orders are all accurate and complete.  Jacky Kindle, FNP  [facility] [phone] (phone) [phone] (fax)  [facility]

## 2022-12-28 NOTE — Assessment & Plan Note (Signed)
Acute viral illness x2-3 weeks ago Pt continues to have some malaise and fatigue Continue supportive care Defer covid testing given length of symptoms and no fevers Your symptoms and exam findings are most consistent with a viral upper respiratory infection. These usually run their course in 5-7 days. Unfortunately, antibiotics don't work against viruses and just increase your risk of other issues such as diarrhea, yeast infections, and resistant infections.  If you start feeling worse with facial pain, high fever, cough, shortness of breath or start feeling significantly worse, please call us right away to be further evaluated.  Some things that can make you feel better are: - Increased rest - Increasing Fluids - Acetaminophen / ibuprofen as needed for fever/pain.  - Salt water gargling, chloraseptic spray and throat lozenges - OTC pseudoephedrine.  - Mucinex.  - Saline sinus flushes or a neti pot.  - Humidifying the air.

## 2022-12-28 NOTE — Assessment & Plan Note (Signed)
Acute, stable Reports "bumps" however, not are visible to the eye Defer swab given normal exam and no fevers or known exposures Recommend trial of magic mouthwash to assist

## 2022-12-28 NOTE — Assessment & Plan Note (Signed)
Acute, iso change of products Recommend topical moistening and anti itch creams  Continue to monitor

## 2023-01-17 ENCOUNTER — Ambulatory Visit (INDEPENDENT_AMBULATORY_CARE_PROVIDER_SITE_OTHER): Payer: Medicare Other

## 2023-01-17 DIAGNOSIS — Z23 Encounter for immunization: Secondary | ICD-10-CM

## 2023-01-18 ENCOUNTER — Ambulatory Visit: Payer: Medicare Other | Admitting: Dermatology

## 2023-01-18 ENCOUNTER — Encounter: Payer: Self-pay | Admitting: Dermatology

## 2023-01-18 DIAGNOSIS — Z79899 Other long term (current) drug therapy: Secondary | ICD-10-CM

## 2023-01-18 DIAGNOSIS — L304 Erythema intertrigo: Secondary | ICD-10-CM

## 2023-01-18 DIAGNOSIS — L408 Other psoriasis: Secondary | ICD-10-CM

## 2023-01-18 DIAGNOSIS — L820 Inflamed seborrheic keratosis: Secondary | ICD-10-CM

## 2023-01-18 DIAGNOSIS — Z7189 Other specified counseling: Secondary | ICD-10-CM

## 2023-01-18 DIAGNOSIS — L409 Psoriasis, unspecified: Secondary | ICD-10-CM

## 2023-01-18 NOTE — Patient Instructions (Addendum)
Cryotherapy Aftercare  Wash gently with soap and water everyday.   Apply Vaseline and Band-Aid daily until healed.   Increase Otezla to 30 mg every day.   Breast and groin folds: Start Vtama cream at bedtime to affected areas at breasts then apply Zeasorb AF powder  Stop Ketoconazole cream  Start [facility] Iodoquinol 1%, Hydrocortisone 2.5%, Niacinamide 2% Cream every morning for at least 2 weeks, stop when improved, resume when flared.   Instructions for [facility] Medications  One or more of your medications was sent to the [facility] mail order compounding pharmacy. You will receive an email from them and can purchase the medicine through that link. It will then be mailed to your home at the address you confirmed. If for any reason you do not receive an email from them, please check your spam folder. If you still do not find the email, please let us know. [facility] phone number is [phone].    Due to recent changes in healthcare laws, you may see results of your pathology and/or laboratory studies on MyChart before the doctors have had a chance to review them. We understand that in some cases there may be results that are confusing or concerning to you. Please understand that not all results are received at the same time and often the doctors may need to interpret multiple results in order to provide you with the best plan of care or course of treatment. Therefore, we ask that you please give [place] 2 business days to thoroughly review all your results before contacting the office for clarification. Should we see a critical lab result, you will be contacted sooner.   If You Need Anything After Your Visit  If you have any questions or concerns for your doctor, please call our main line at [phone] and press option 4 to reach your doctor's medical assistant. If no one answers, please leave a voicemail as directed and we will return your call as soon as possible.  Messages left after 4 pm will be answered the following business day.   You may also send [place] a message via MyChart. We typically respond to MyChart messages within 1-2 business days.  For prescription refills, please ask your pharmacy to contact our office. Our fax number is [phone].  If you have an urgent issue when the clinic is closed that cannot wait until the next business day, you can page your doctor at the number below.    Please note that while we do our best to be available for urgent issues outside of office hours, we are not available 24/7.   If you have an urgent issue and are unable to reach [place], you may choose to seek medical care at your doctor's office, retail clinic, urgent care center, or emergency room.  If you have a medical emergency, please immediately call 911 or go to the emergency department.  Pager Numbers  - Dr. Gwen Pounds: [phone]  - Dr. Roseanne Reno: [phone]  - Dr. Katrinka Blazing: [phone]   In the event of inclement weather, please call our main line at [phone] for an update on the status of any delays or closures.  Dermatology Medication Tips: Please keep the boxes that topical medications come in in order to help keep track of the instructions about where and how to use these. Pharmacies typically print the medication instructions only on the boxes and not directly on the medication tubes.   If your medication is too expensive, please contact our office  at [phone] option 4 or send [place] a message through MyChart.   We are unable to tell what your co-pay for medications will be in advance as this is different depending on your insurance coverage. However, we may be able to find a substitute medication at lower cost or fill out paperwork to get insurance to cover a needed medication.   If a prior authorization is required to get your medication covered by your insurance company, please allow [place] 1-2 business days to complete this process.  Drug  prices often vary depending on where the prescription is filled and some pharmacies may offer cheaper prices.  The website www.goodrx.com contains coupons for medications through different pharmacies. The prices here do not account for what the cost may be with help from insurance (it may be cheaper with your insurance), but the website can give you the price if you did not use any insurance.  - You can print the associated coupon and take it with your prescription to the pharmacy.  - You may also stop by our office during regular business hours and pick up a GoodRx coupon card.  - If you need your prescription sent electronically to a different pharmacy, notify our office through [facility] or by phone at [phone] option 4.     Si Usted Necesita Algo Despus de Su Visita  Tambin puede enviarnos un mensaje a travs de Clinical cytogeneticist. Por lo general respondemos a los mensajes de MyChart en el transcurso de 1 a 2 das hbiles.  Para renovar recetas, por favor pida a su farmacia que se ponga en contacto con nuestra oficina. Annie Sable de fax es [place] [phone].  Si tiene un asunto urgente cuando la clnica est cerrada y que no puede esperar hasta el siguiente da hbil, puede llamar/localizar a su doctor(a) al nmero que aparece a continuacin.   Por favor, tenga en cuenta que aunque hacemos todo lo posible para estar disponibles para asuntos urgentes fuera del horario de [place], no estamos disponibles las 24 horas del da, los 7 [address]  [address] de la [place].   Si tiene un problema urgente y no puede comunicarse con nosotros, puede optar por buscar atencin mdica  en el consultorio de su doctor(a), en una clnica privada, en un centro de atencin urgente o en una sala de emergencias.  Si tiene Engineer, drilling, por favor llame inmediatamente al 911 o vaya a la sala de emergencias.  Nmeros de bper  - Dr. Gwen Pounds: [phone]  - Dra. Roseanne Reno: [phone]  - Dr. Katrinka Blazing:  [phone]   En caso de inclemencias del tiempo, por favor llame a Lacy Duverney principal al [phone] para una actualizacin sobre el [place] de cualquier retraso o cierre.  Consejos para la medicacin en dermatologa: Por favor, guarde las cajas en las que vienen los medicamentos de uso tpico para ayudarle a seguir las instrucciones sobre dnde y cmo usarlos. Las farmacias generalmente imprimen las instrucciones del medicamento slo en las cajas y no directamente en los tubos del [place].   Si su medicamento es muy caro, por favor, pngase en contacto con Rolm Gala llamando al [phone] y presione la opcin 4 o envenos un mensaje a travs de Clinical cytogeneticist.   No podemos decirle cul ser su copago por los medicamentos por adelantado ya que esto es diferente dependiendo de la cobertura de su seguro. Sin embargo, es posible que podamos encontrar un medicamento sustituto a Audiological scientist un formulario para que el seguro cubra el medicamento que  se considera necesario.   Si se requiere una autorizacin previa para que su compaa de seguros [place] su medicamento, por favor permtanos de 1 a 2 das hbiles para completar [address]  Los precios de los medicamentos varan con frecuencia dependiendo del Environmental consultant de dnde se surte la receta y alguna farmacias pueden ofrecer precios ms baratos.  El sitio web www.goodrx.com tiene cupones para medicamentos de Health and safety inspector. Los precios aqu no tienen en cuenta lo que podra costar con la ayuda del seguro (puede ser ms barato con su seguro), pero el sitio web puede darle el precio si no utiliz Tourist information centre manager.  - Puede imprimir el cupn correspondiente y llevarlo con su receta a la farmacia.  - Tambin puede pasar por nuestra oficina durante el horario de atencin regular y Education officer, museum una tarjeta de cupones de GoodRx.  - Si necesita que su receta se enve electrnicamente a una farmacia diferente, informe a nuestra oficina a travs de  MyChart de [place] o por telfono llamando al [phone] y presione la opcin 4.

## 2023-01-18 NOTE — Progress Notes (Signed)
Follow-Up Visit   Subjective  Gina [name] is a 67 y.o. female who presents for the following: Psoriasis. 6 month follow up. Taking Otezla 30 mg every other day. Insurance will not cover so she is trying to conserve the medication. Is now on Medicare since turning 67.  Denies side effects from [place].   Check spots on right dorsal hand and left side of face. Scaly, raised. Irritated, itching at times.   The following portions of the chart were reviewed this encounter and updated as appropriate: medications, allergies, medical history  Review of Systems:  No other skin or systemic complaints except as noted in HPI or Assessment and Plan.  Objective  Well appearing patient in no apparent distress; mood and affect are within normal limits.  Areas Examined: Face, arms, hands, legs  Relevant exam findings are noted in the Assessment and Plan.    Right Hand - Dorsal x1 Erythematous keratotic or waxy stuck-on papule or plaque.    Assessment & Plan   Inflamed seborrheic keratosis Right Hand - Dorsal x1  Symptomatic, irritating, patient would like treated.  Destruction of lesion - Right Hand - Dorsal x1 Complexity: simple   Destruction method: cryotherapy   Informed consent: discussed and consent obtained   Timeout:  patient name, date of birth, surgical site, and procedure verified Lesion destroyed using liquid nitrogen: Yes   Region frozen until ice ball extended beyond lesion: Yes   Outcome: patient tolerated procedure well with no complications   Post-procedure details: wound care instructions given   Additional details:  Prior to procedure, discussed risks of blister formation, small wound, skin dyspigmentation, or rare scar following cryotherapy. Recommend Vaseline ointment to treated areas while healing.   Psoriasis  Related Medications OTEZLA 30 MG TABS TAKE 1 TABLET BY MOUTH 2 TIMES A DAY.  Medication management  Counseling and coordination of  care  Intertrigo    PSORIASIS with inverse psoriasis and intertrigo and possible Psoriatic arthritis-due to joint ache  Well-demarcated erythematous papules/plaques with silvery scale, guttate pink scaly papules. 4% BSA.  Chronic and persistent condition with duration or expected duration over one year. Condition is improving with treatment but not currently at goal.   Patient c/o joint pain  Treatment Plan: Increase Otezla to 30 mg every day. Samples given today to last 4-6 weeks.   Counseling on psoriasis and coordination of care  psoriasis is a chronic non-curable, but treatable genetic/hereditary disease that may have other systemic features affecting other organ systems such as joints (Psoriatic Arthritis). It is associated with an increased risk of inflammatory bowel disease, heart disease, non-alcoholic fatty liver disease, and depression.  Treatments include light and laser treatments; topical medications; and systemic medications including oral and injectables.  Long term medication management.  Patient is using long term (months to years) prescription medication  to control their dermatologic condition.  These medications require periodic monitoring to evaluate for efficacy and side effects and may require periodic laboratory monitoring.  INTERTRIGO With inverse psoriasis Exam Erythematous macerated patches  Chronic and persistent condition with duration or expected duration over one year. Condition is improving with treatment but not currently at goal.   Intertrigo is a chronic recurrent rash that occurs in skin fold areas that may be associated with friction; heat; moisture; yeast; fungus; and bacteria.  It is exacerbated by increased movement / activity; sweating; and higher atmospheric temperature.  Treatment Plan Start Vtama cream at bedtime to affected areas at breasts then apply Zeasorb AF powder  Stop Ketoconazole cream  Start [facility] Iodoquinol 1%,  Hydrocortisone 2.5%, Niacinamide 2% Cream every morning for at least 2 weeks, stop when improved, resume when flared.   The patient was advised this is not covered by insurance since it is made by a compounding pharmacy. They will receive an email to check out and the medication will be mailed to their home.       Return for Psoriasis Follow Up in 4-6 weeks.  I, Lawson Radar, CMA, am acting as scribe for Armida Sans, MD.   Documentation: I have reviewed the above documentation for accuracy and completeness, and I agree with the above.  Armida Sans, MD

## 2023-01-20 ENCOUNTER — Encounter: Payer: Self-pay | Admitting: Dermatology

## 2023-01-22 ENCOUNTER — Telehealth: Payer: Self-pay

## 2023-01-22 NOTE — Telephone Encounter (Signed)
Faxed information to [employer] for [employer].

## 2023-01-24 ENCOUNTER — Other Ambulatory Visit: Payer: Self-pay | Admitting: Family Medicine

## 2023-01-24 DIAGNOSIS — Z1231 Encounter for screening mammogram for malignant neoplasm of breast: Secondary | ICD-10-CM

## 2023-02-07 ENCOUNTER — Ambulatory Visit
Admission: RE | Admit: 2023-02-07 | Discharge: 2023-02-07 | Disposition: A | Discharge status: Home / Self Care | Payer: Medicare Other | Source: Ambulatory Visit | Attending: Family Medicine | Admitting: Family Medicine

## 2023-02-07 DIAGNOSIS — Z1231 Encounter for screening mammogram for malignant neoplasm of breast: Secondary | ICD-10-CM | POA: Insufficient documentation

## 2023-02-09 ENCOUNTER — Other Ambulatory Visit: Payer: Self-pay | Admitting: Family Medicine

## 2023-02-09 DIAGNOSIS — Z1211 Encounter for screening for malignant neoplasm of colon: Secondary | ICD-10-CM

## 2023-02-09 DIAGNOSIS — Z1212 Encounter for screening for malignant neoplasm of rectum: Secondary | ICD-10-CM

## 2023-02-11 IMAGING — US US PELVIS COMPLETE WITH TRANSVAGINAL
1 series · 13 of 25 positions shown · non-contrast
Comparison: Ultrasound dated 02/01/2018.

CLINICAL DATA: Post menopausal bleeding.

EXAM:
TRANSABDOMINAL AND TRANSVAGINAL ULTRASOUND OF PELVIS
TECHNIQUE: Both transabdominal and transvaginal ultrasound examinations of the
pelvis were performed. Transabdominal technique was performed for
global imaging of the pelvis including uterus, ovaries, adnexal
regions, and pelvic cul-de-sac. It was necessary to proceed with
endovaginal exam following the transabdominal exam to visualize the
endometrium and ovaries.

[Series 1: us pelvis complete with transvaginal · 0.18mm/px · 13 of 87 slices shown]
[im 1/87]
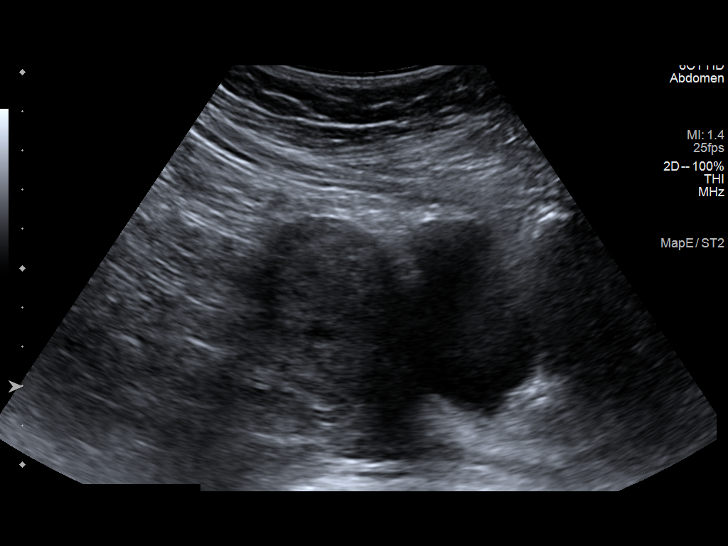
[im 8/87]
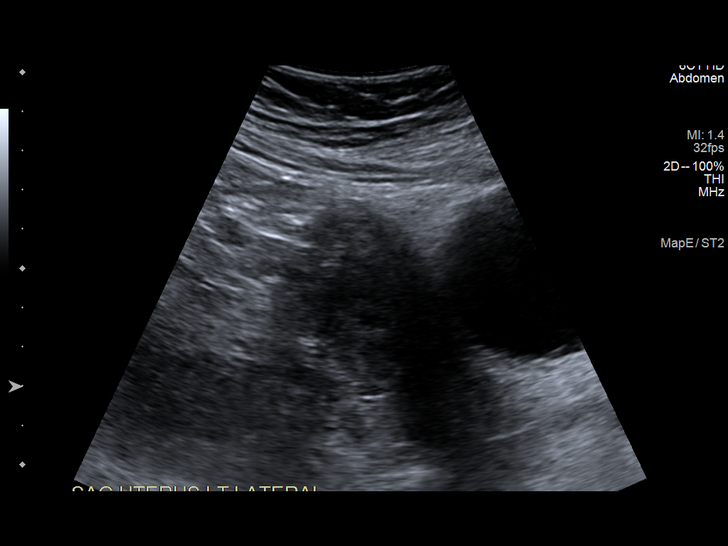
[im 15/87]
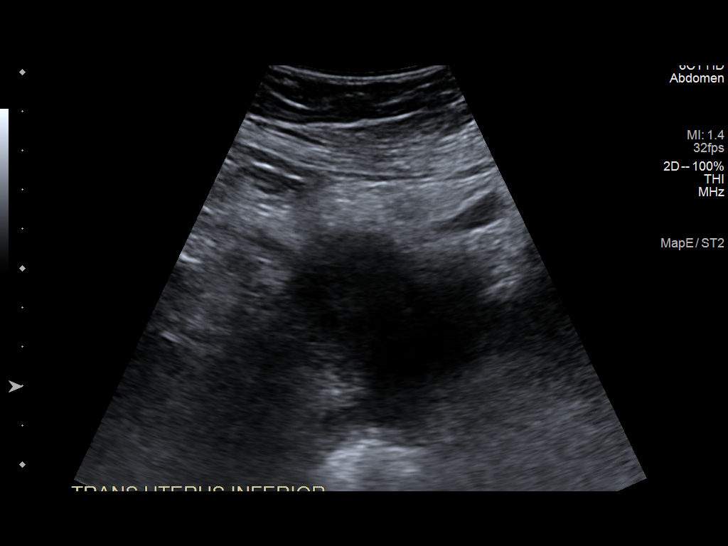
[im 22/87]
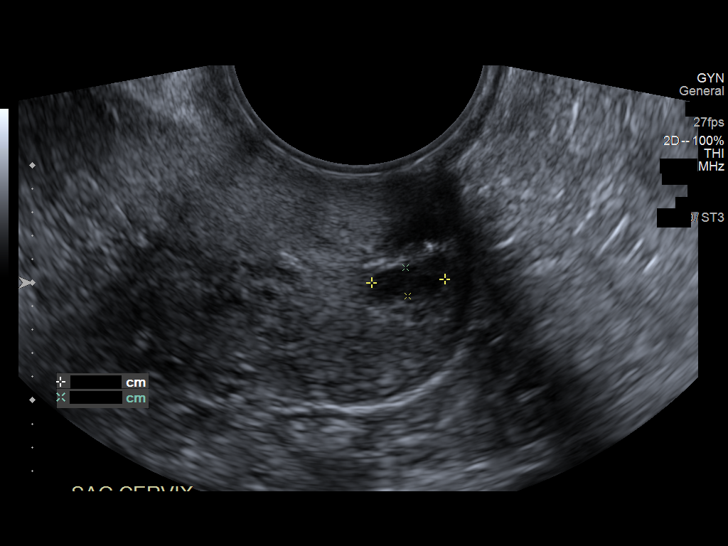
[im 29/87]
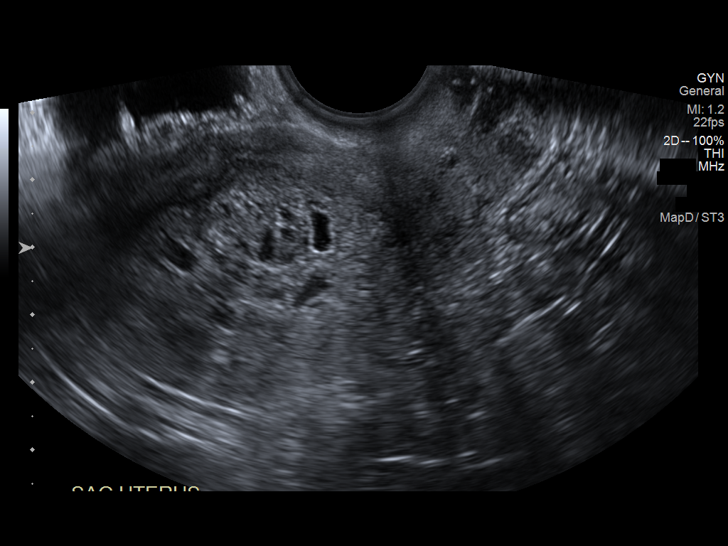
[im 36/87]
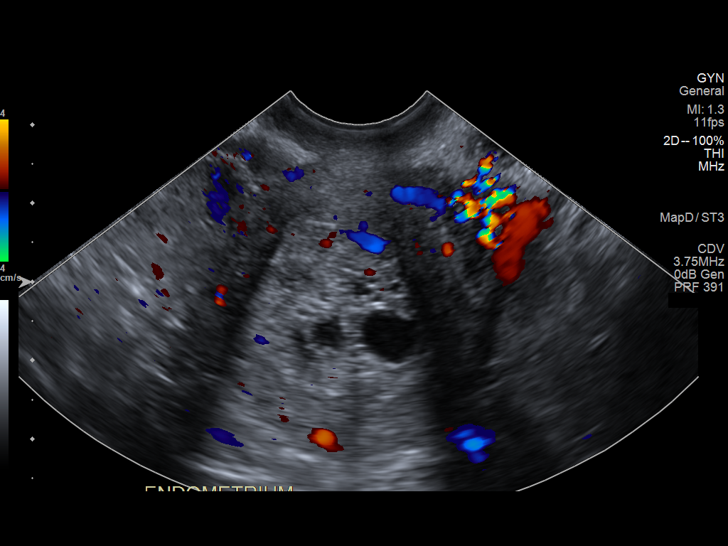
[im 44/87]
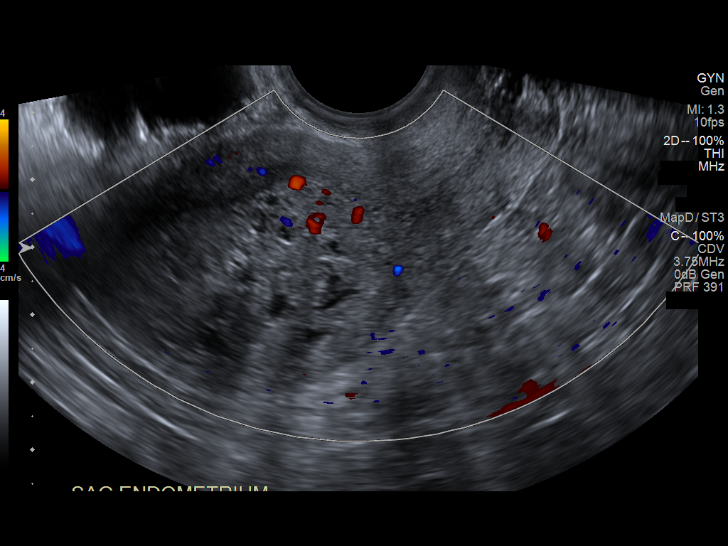
[im 51/87]
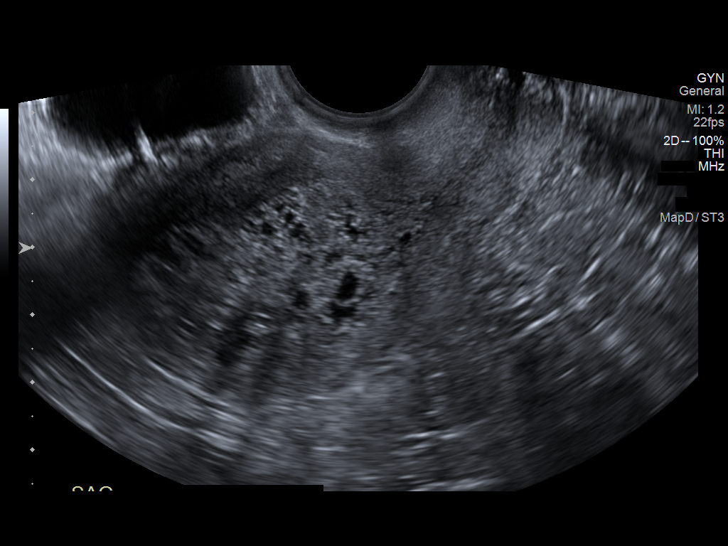
[im 58/87]
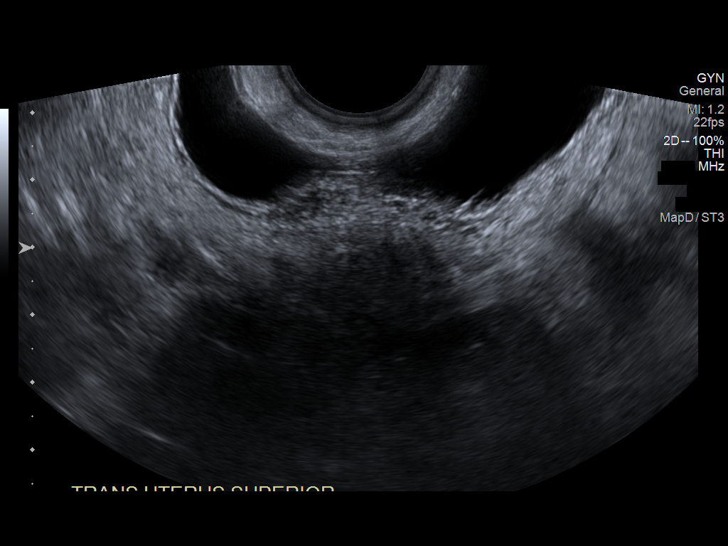
[im 65/87]
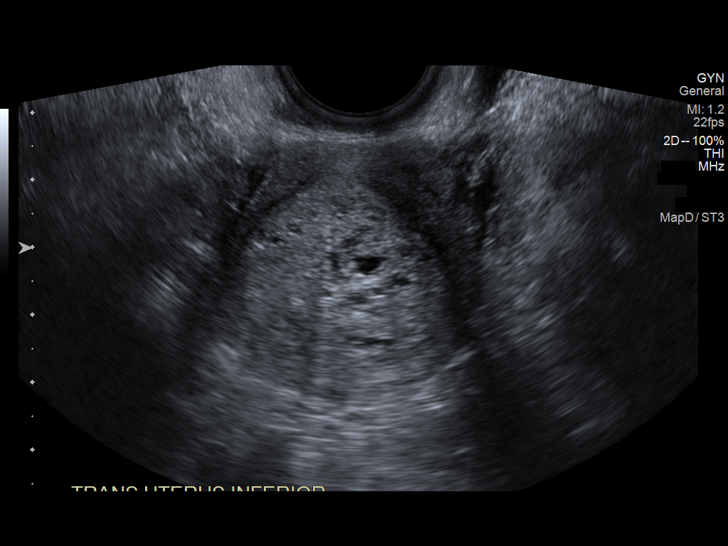
[im 72/87]
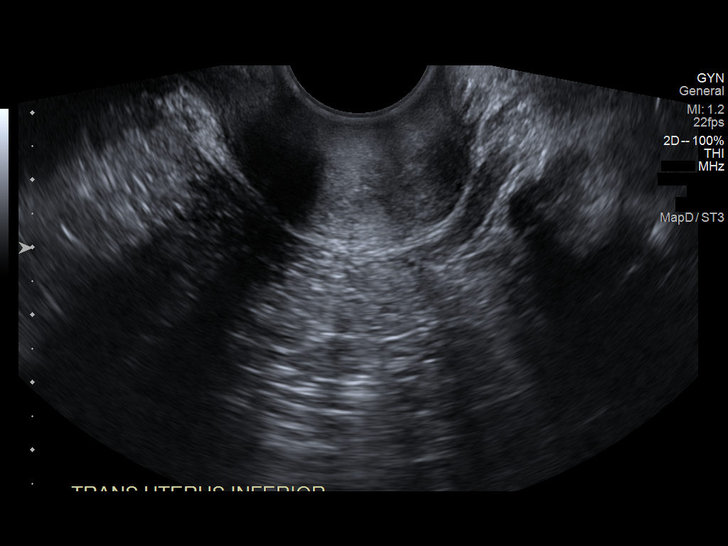
[im 79/87]
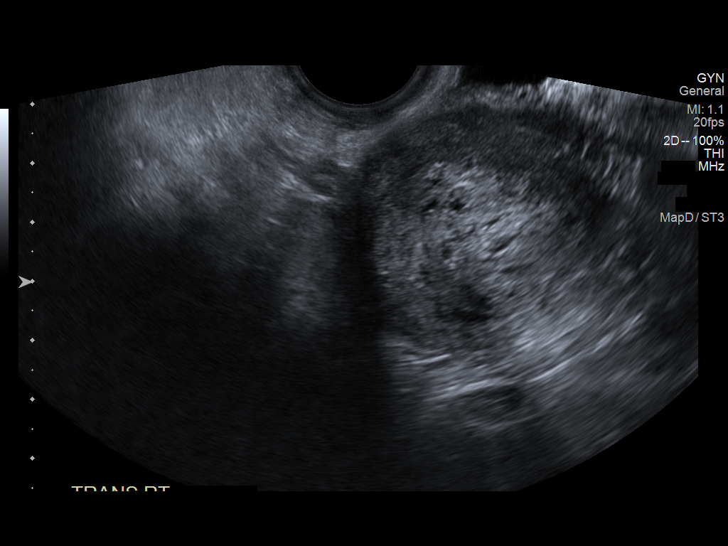
[im 87/87]
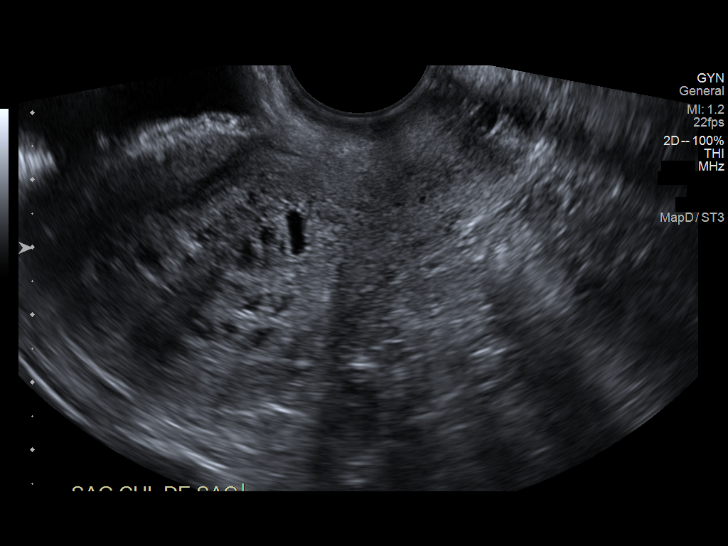

[13 of 25 positions shown; findings below may reference images not displayed]

FINDINGS: Uterus

Measurements: 7.5 x 4.1 x 3.9 cm = volume: 63 mL. The uterus is
anteverted and appears unremarkable.

Endometrium

Thickness: 2.7 cm. The endometrium is thickened and heterogeneous
with cystic spaces similar to prior ultrasound. Findings may
represent endometrial hyperplasia or neoplasm. Further evaluation
with hysteroscopy, if not previously performed, recommended.

Right ovary

The right ovary is not visualized.

Left ovary

The left ovary is not visualized.

Other findings

No abnormal free fluid.
IMPRESSION: 1. Thickened endometrium may represent hyperplasia or neoplasm.
Further evaluation with hysteroscopy, if not previously performed,
recommended.
2. Nonvisualization of the ovaries.

## 2023-02-20 ENCOUNTER — Ambulatory Visit: Payer: Medicare Other

## 2023-02-26 ENCOUNTER — Other Ambulatory Visit: Payer: Self-pay | Admitting: Podiatry

## 2023-03-06 DIAGNOSIS — Z7960 Long term (current) use of unspecified immunomodulators and immunosuppressants: Secondary | ICD-10-CM | POA: Diagnosis not present

## 2023-03-06 DIAGNOSIS — L4050 Arthropathic psoriasis, unspecified: Secondary | ICD-10-CM | POA: Diagnosis not present

## 2023-03-06 DIAGNOSIS — L409 Psoriasis, unspecified: Secondary | ICD-10-CM | POA: Diagnosis not present

## 2023-03-07 ENCOUNTER — Ambulatory Visit: Payer: Medicare Other | Admitting: Dermatology

## 2023-03-07 DIAGNOSIS — L408 Other psoriasis: Secondary | ICD-10-CM

## 2023-03-07 DIAGNOSIS — L304 Erythema intertrigo: Secondary | ICD-10-CM | POA: Diagnosis not present

## 2023-03-07 DIAGNOSIS — L299 Pruritus, unspecified: Secondary | ICD-10-CM | POA: Diagnosis not present

## 2023-03-07 DIAGNOSIS — L409 Psoriasis, unspecified: Secondary | ICD-10-CM | POA: Diagnosis not present

## 2023-03-07 DIAGNOSIS — Z79899 Other long term (current) drug therapy: Secondary | ICD-10-CM

## 2023-03-07 DIAGNOSIS — L259 Unspecified contact dermatitis, unspecified cause: Secondary | ICD-10-CM

## 2023-03-07 DIAGNOSIS — Z7189 Other specified counseling: Secondary | ICD-10-CM

## 2023-03-07 NOTE — Patient Instructions (Signed)

## 2023-03-07 NOTE — Progress Notes (Signed)
   Follow-Up Visit   Subjective  Gina [name] is a 67 y.o. female who presents for the following: Psoriasis - improving with Vtama and [place], but neither one of those are covered by her insurance, and the copay for [place] is over $1,000 monthly. Pt c/o occasional itching on the face and she would like to discuss treatment options.   The following portions of the chart were reviewed this encounter and updated as appropriate: medications, allergies, medical history  Review of Systems:  No other skin or systemic complaints except as noted in HPI or Assessment and Plan.  Objective  Well appearing patient in no apparent distress; mood and affect are within normal limits.  Areas Examined: The face, arms, and hands  Relevant exam findings are noted in the Assessment and Plan.      Assessment & Plan   Encounter for long-term (current) use of high-risk medication  Related Procedures QuantiFERON-TB Gold Plus Hepatitis B Core AB, Total Hep B Surface Antigen Hep C Antibody   PSORIASIS with inverse psoriasis and intertrigo with possible psoriatic arthritis (joint aches)  Well-demarcated erythematous papules/plaques with silvery scale, guttate pink scaly papules. 4% BSA on treatment  Chronic and persistent condition with duration or expected duration over one year. Condition is symptomatic/ bothersome to patient. Not currently at goal.  Treatment Plan: Samples given of Otezla 30 mg po QD and Vtama cream apply to aa QD.  Counseling on psoriasis and coordination of care  psoriasis is a chronic non-curable, but treatable genetic/hereditary disease that may have other systemic features affecting other organ systems such as joints (Psoriatic Arthritis). It is associated with an increased risk of inflammatory bowel disease, heart disease, non-alcoholic fatty liver disease, and depression.  Treatments include light and laser treatments; topical medications; and systemic medications  including oral and injectables.  Discussed treatment options of other biologics like Sotyktu and injectables. Patient has several months left of Otezla prescriptions and more samples given today. Pt is going to contact [employer] patient assistance with the denial letter from her insurance to see if that would help qualify her for financial assistance. If pt unable to get assistance for [facility] plan to switch to Sotyktu or an injectable biologic that he insurance will cover. Screening labs ordered today. Pt to have done sometime in the next three months.   Possible contact dermatitis of unknown etiology with pruritus  Exam: Clear, pt c/o itching on the face  Treatment Plan: Recommend OTC HC cream QD-BID PRN.   Return in about 7 months (around 10/05/2023) for psoriasis follow up.  Maylene Roes, CMA, am acting as scribe for Armida Sans, MD .  Documentation: I have reviewed the above documentation for accuracy and completeness, and I agree with the above.  Armida Sans, MD

## 2023-03-14 ENCOUNTER — Ambulatory Visit (INDEPENDENT_AMBULATORY_CARE_PROVIDER_SITE_OTHER): Payer: Medicare Other | Admitting: Family Medicine

## 2023-03-14 VITALS — BP 127/68 | HR 83 | Wt 147.9 lb

## 2023-03-14 DIAGNOSIS — K5902 Outlet dysfunction constipation: Secondary | ICD-10-CM | POA: Diagnosis not present

## 2023-03-14 DIAGNOSIS — K649 Unspecified hemorrhoids: Secondary | ICD-10-CM | POA: Insufficient documentation

## 2023-03-14 DIAGNOSIS — K644 Residual hemorrhoidal skin tags: Secondary | ICD-10-CM | POA: Diagnosis not present

## 2023-03-14 MED ORDER — HYDROCORTISONE ACETATE 25 MG RE SUPP
25.0000 mg | Freq: Two times a day (BID) | RECTAL | 0 refills | Status: DC
Start: 2023-03-14 — End: 2023-03-20

## 2023-03-14 MED ORDER — METAMUCIL FIBER PO CHEW: 1.0000 | CHEWABLE_TABLET | ORAL | Status: AC

## 2023-03-14 NOTE — Progress Notes (Unsigned)
Established patient visit  Patient: Gina [name]   DOB: 09-27-1955   67 y.o. Female  MRN: [id number] Visit Date: 03/14/2023  Today's healthcare provider: Jacky Kindle, FNP  Introduced to nurse practitioner role and practice setting.  All questions answered.  Discussed provider/patient relationship and expectations.  Chief Complaint  Patient presents with   Hemorrhoids    Present X 1 week. Patient noticed blood when wiping also aware she pushes to go to the restroom. Patient has bought metamucil to take. Notices blood whenever making bowel movement   Subjective    HPI HPI     Hemorrhoids    Additional comments: Present X 1 week. Patient noticed blood when wiping also aware she pushes to go to the restroom. Patient has bought metamucil to take. Notices blood whenever making bowel movement        Comments   Cologuard at home waiting for current concerns to resolve before completing      Last edited by Acey Lav, CMA on 03/14/2023  2:18 PM.      Medications: Outpatient Medications Prior to Visit  Medication Sig   ALPRAZolam (XANAX) 0.25 MG tablet TAKE 1 TABLET BY MOUTH TWICE DAILY AS NEEDED FOR ANXIETY   amLODipine (NORVASC) 2.5 MG tablet Take 3 tablets (7.5 mg total) by mouth daily.   atorvastatin (LIPITOR) 40 MG tablet Take 1 tablet (40 mg total) by mouth daily.   calcipotriene (DOVONOX) 0.005 % cream Apply topically daily. To psoriasis as needed 7 days a week   celecoxib (CELEBREX) 100 MG capsule TAKE 1 CAPSULE BY MOUTH TWICE DAILY( EVERY TWELVE HOURS)   cetirizine (ZYRTEC) 10 MG tablet Take 10 mg by mouth daily as needed for allergies.   Cholecalciferol (VITAMIN D3) 1000 UNITS CAPS Take 1,000 Units by mouth at bedtime.    fluconazole (DIFLUCAN) 150 MG tablet Take 2 tablets (300 mg total) by mouth daily.   fluticasone (FLONASE) 50 MCG/ACT nasal spray Place 2 sprays into both nostrils daily as needed for rhinitis.   Glycopyrronium Tosylate (QBREXZA) 2.4 %  PADS Apply 1 application  topically daily.   Halobetasol Propionate (BRYHALI) 0.01 % LOTN Apply 1 application. topically daily. Up to 5 days per week per flare   hydrocortisone 1 % ointment Apply 1 Application topically 2 (two) times daily.   ketoconazole (NIZORAL) 2 % cream APPLY UNDER THE BREAST DAILY   levothyroxine (SYNTHROID) 25 MCG tablet TAKE 1 TABLET BY MOUTH EVERY DAY BEFORE BREAKFAST   magic mouthwash (nystatin, lidocaine, diphenhydrAMINE, alum & mag hydroxide) suspension Swish and swallow 5 mLs 4 (four) times daily as needed for mouth pain.   mineral oil-hydrophilic petrolatum (AQUAPHOR) ointment Apply topically as needed for dry skin.   OTEZLA 30 MG TABS TAKE 1 TABLET BY MOUTH 2 TIMES A DAY.   No facility-administered medications prior to visit.    Review of Systems     Objective    BP 127/68 (BP Location: Left Arm, Patient Position: Sitting, Cuff Size: Normal)   Pulse 83   Wt 147 lb 14.4 oz (67.1 kg)   SpO2 100%   BMI 27.95 kg/m   Physical Exam Vitals and nursing note reviewed.  Constitutional:      General: She is not in acute distress.    Appearance: Normal appearance. She is overweight. She is not ill-appearing, toxic-appearing or diaphoretic.  HENT:     Head: Normocephalic and atraumatic.  Cardiovascular:     Rate and Rhythm: Normal rate and regular  rhythm.     Pulses: Normal pulses.     Heart sounds: Normal heart sounds. No murmur heard.    No friction rub. No gallop.  Pulmonary:     Effort: Pulmonary effort is normal.     Breath sounds: Normal breath sounds. No rhonchi.  Abdominal:     General: Bowel sounds are normal. There is no distension.     Palpations: Abdomen is soft.     Tenderness: There is no abdominal tenderness. There is no guarding.  Musculoskeletal:        General: No swelling, tenderness, deformity or signs of injury. Normal range of motion.     Right lower leg: No edema.     Left lower leg: No edema.  Skin:    General: Skin is warm  and dry.     Capillary Refill: Capillary refill takes less than 2 seconds.     Coloration: Skin is not jaundiced or pale.     Findings: No bruising, erythema, lesion or rash.  Neurological:     General: No focal deficit present.     Mental Status: She is alert and oriented to person, place, and time. Mental status is at baseline.     Cranial Nerves: No cranial nerve deficit.     Sensory: No sensory deficit.     Motor: No weakness.     Coordination: Coordination normal.  Psychiatric:        Mood and Affect: Mood normal.        Behavior: Behavior normal.        Thought Content: Thought content normal.        Judgment: Judgment normal.     No results found for any visits on 03/14/23.  Assessment & Plan     Problem List Items Addressed This Visit       Cardiovascular and Mediastinum   Hemorrhoids - Primary     Other   Constipation due to outlet dysfunction   Relevant Medications   Metamucil Fiber CHEW   hydrocortisone (ANUSOL-HC) 25 MG suppository   No follow-ups on file.     Leilani Merl, FNP, have reviewed all documentation for this visit. The documentation on 03/14/23 for the exam, diagnosis, procedures, and orders are all accurate and complete.  Jacky Kindle, FNP  [facility] [phone] (phone) [phone] (fax)  [facility]

## 2023-03-15 ENCOUNTER — Encounter: Payer: Self-pay | Admitting: Family Medicine

## 2023-03-15 ENCOUNTER — Telehealth: Payer: Self-pay

## 2023-03-15 NOTE — Assessment & Plan Note (Addendum)
Known internal hemorrhoids per review of previous colon cancer screening Some residual hemorrhoid tissue <1cm on R side  Continue bowel and lifestyle modification Advised OK to defer cologuard until acute external bleeding has subsided  Trial of suppositories to assist

## 2023-03-15 NOTE — Telephone Encounter (Signed)
Copied from CRM [id number]. Topic: General - Other >> Mar 15, 2023  4:05 PM Clide Dales wrote: Patient called to see if provider would do a formulary exception for hydrocortisone (ANUSOL-HC) 25 MG suppository.

## 2023-03-15 NOTE — Assessment & Plan Note (Signed)
Acute; worsening Continue fiber supplements and lifestyle mgmt to assist

## 2023-03-16 NOTE — Telephone Encounter (Signed)
Please review for Gina [name] and advise

## 2023-03-16 NOTE — Telephone Encounter (Signed)
Patient has called and states that she called in yesterday, 03/15/2023, stating that her insurance will not cover the suppository that was prescribed to her and that she needs an alternative that compares to this called in. Patient states she has not heard anything back about this and patient states that her pharmacist told her nothing else compares to it. Patient would like a call back today regarding this @ phone # [phone]. Patient has been made aware that Merita Norton, FNP, PCP, is out of office today.

## 2023-03-16 NOTE — Telephone Encounter (Signed)
I went ahead and created a PA in case that's what it needs

## 2023-03-17 ENCOUNTER — Encounter: Payer: Self-pay | Admitting: Dermatology

## 2023-03-19 NOTE — Telephone Encounter (Signed)
Outcome Denied on November 9 by [employer] [place] [facility] 2017 Denied. We denied this request under Medicare Part D because the Social Security Act permits the exclusion of certain drugs or classes of drugs from coverage under Part D. Hydrocortisone Acetate Suppositories is an unapproved drug, which is one of the excluded classes of drugs listed in the Social Security Act. Because Hydrocortisone Acetate Suppositories is excluded from coverage under this Act, as well as listed as an exclusion in your Evidence of Coverage document, we are denying your request. Drug Hydrocortisone Acetate 25MG  suppositories   Patient needs an alternative that compares to medication.

## 2023-03-20 ENCOUNTER — Other Ambulatory Visit: Payer: Self-pay | Admitting: Family Medicine

## 2023-03-20 DIAGNOSIS — K648 Other hemorrhoids: Secondary | ICD-10-CM

## 2023-03-20 DIAGNOSIS — K644 Residual hemorrhoidal skin tags: Secondary | ICD-10-CM

## 2023-03-20 MED ORDER — HYDROCORTISONE (PERIANAL) 2.5 % EX CREA: 1.0000 | TOPICAL_CREAM | Freq: Two times a day (BID) | CUTANEOUS | 0 refills | Status: DC

## 2023-03-20 NOTE — Telephone Encounter (Signed)
Patient aware of new prescription sent in.

## 2023-03-27 DIAGNOSIS — K080 Exfoliation of teeth due to systemic causes: Secondary | ICD-10-CM | POA: Diagnosis not present

## 2023-04-03 DIAGNOSIS — K080 Exfoliation of teeth due to systemic causes: Secondary | ICD-10-CM | POA: Diagnosis not present

## 2023-04-30 ENCOUNTER — Other Ambulatory Visit: Payer: Self-pay | Admitting: Family Medicine

## 2023-04-30 DIAGNOSIS — E034 Atrophy of thyroid (acquired): Secondary | ICD-10-CM

## 2023-04-30 DIAGNOSIS — F411 Generalized anxiety disorder: Secondary | ICD-10-CM

## 2023-04-30 NOTE — Telephone Encounter (Signed)
Please advise 

## 2023-05-03 ENCOUNTER — Ambulatory Visit: Payer: Self-pay

## 2023-05-03 ENCOUNTER — Ambulatory Visit
Admission: EM | Admit: 2023-05-03 | Discharge: 2023-05-03 | Disposition: A | Discharge status: Home / Self Care | Payer: Medicare Other | Attending: Emergency Medicine | Admitting: Emergency Medicine

## 2023-05-03 ENCOUNTER — Encounter: Payer: Self-pay | Admitting: Emergency Medicine

## 2023-05-03 ENCOUNTER — Other Ambulatory Visit: Payer: Self-pay

## 2023-05-03 DIAGNOSIS — N898 Other specified noninflammatory disorders of vagina: Secondary | ICD-10-CM | POA: Diagnosis not present

## 2023-05-03 MED ORDER — FLUCONAZOLE 150 MG PO TABS: 150.0000 mg | ORAL_TABLET | ORAL | 0 refills | Status: DC | PRN

## 2023-05-03 MED ORDER — MICONAZOLE NITRATE 2 % EX CREA: 1.0000 | TOPICAL_CREAM | Freq: Two times a day (BID) | CUTANEOUS | 0 refills | Status: DC

## 2023-05-03 NOTE — Discharge Instructions (Addendum)
Today you are being treated prophylactically for yeast.   Take diflucan 150 mg once, if symptoms still present in 3 days then you may take second pill   May apply topical external cream twice daily for additional comfort  Yeast infections which are caused by a naturally occurring fungus called candida. Vaginosis is an inflammation of the vagina that can result in discharge, itching and pain. The cause is usually a change in the normal balance of vaginal bacteria or an infection. Vaginosis can also result from reduced estrogen levels after menopause.  Labs pending 2-3 days, you will be contacted if positive results  In addition:   Avoid baths, hot tubs and whirlpool spas.  Don't use scented or harsh soaps, such as those with deodorant or antibacterial action. Avoid irritants. These include scented tampons and pads. Wipe from front to back after using the toilet.  Don't douche. Your vagina doesn't require cleansing other than normal bathing.  Use a  condom. Wear cotton underwear, this fabric helps absorb moisture
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
## 2023-05-03 NOTE — ED Triage Notes (Signed)
Patient thinks she has a yeast infection.  Reports itching and irritation for a week.  Pcp office suggested [facility]  Patient has been applying cortisone cream

## 2023-05-03 NOTE — Telephone Encounter (Signed)
Message from Phill Myron sent at 05/03/2023  9:34 AM EST  Summary: vaginal itching   vaginal itching   question yeast infection         Chief Complaint: severe vaginal itching, occasional mild pain to the side of perineal area Symptoms: severe itching inside and outside vagina Frequency: 3 weeks  Pertinent Negatives: Patient denies vaginal bleeding, pelvic pain, fever Disposition: [] ED /[x] Urgent Care (no appt availability in office) / [] Appointment(In office/virtual)/ []  [facility]/ [] Home Care/ [] Refused Recommended Disposition /[] [facility]/ []  Follow-up with PCP Additional Notes: no appts  Reason for Disposition  MODERATE-SEVERE itching (i.e., interferes with school, work, or sleep)  Answer Assessment - Initial Assessment Questions 1. SYMPTOM: "What's the main symptom you're concerned about?" (e.g., pain, itching, dryness)     Itching  2. LOCATION: "Where is the  itching located?" (e.g., inside/outside, left/right)     Inside and outside 3. ONSET: "When did the  itching  start?"     1 week  4. PAIN: "Is there any pain?" If Yes, ask: "How bad is it?" (Scale: 1-10; mild, moderate, severe)   -  MILD (1-3): Doesn't interfere with normal activities.    -  MODERATE (4-7): Interferes with normal activities (e.g., work or school) or awakens from sleep.     -  SEVERE (8-10): Excruciating pain, unable to do any normal activities.     Mild  5. ITCHING: "Is there any itching?" If Yes, ask: "How bad is it?" (Scale: 1-10; mild, moderate, severe)     Yes severe 6. CAUSE: "What do you think is causing the discharge?" "Have you had the same problem before? What happened then?"     Yeast infection  7. OTHER SYMPTOMS: "Do you have any other symptoms?" (e.g., fever, itching, vaginal bleeding, pain with urination, injury to genital area, vaginal foreign body)     Itching  Protocols used: Vaginal Symptoms-A-AH

## 2023-06-04 ENCOUNTER — Telehealth: Payer: Self-pay

## 2023-06-06 DIAGNOSIS — Z79899 Other long term (current) drug therapy: Secondary | ICD-10-CM | POA: Diagnosis not present

## 2023-06-07 ENCOUNTER — Other Ambulatory Visit: Payer: Self-pay | Admitting: Podiatry

## 2023-06-12 LAB — QUANTIFERON-TB GOLD PLUS
QuantiFERON Mitogen Value: 6.33 [IU]/mL
QuantiFERON Nil Value: 0.03 [IU]/mL
QuantiFERON TB1 Ag Value: 0.07 [IU]/mL
QuantiFERON TB2 Ag Value: 0.04 [IU]/mL
QuantiFERON-TB Gold Plus: NEGATIVE

## 2023-06-12 LAB — HEPATITIS B SURFACE ANTIGEN: Hepatitis B Surface Ag: NEGATIVE

## 2023-06-12 LAB — HEPATITIS C ANTIBODY: Hep C Virus Ab: REACTIVE — AB

## 2023-06-12 LAB — HEPATITIS B CORE ANTIBODY, TOTAL: Hep B Core Total Ab: NEGATIVE

## 2023-06-18 ENCOUNTER — Other Ambulatory Visit: Payer: Self-pay

## 2023-06-18 ENCOUNTER — Telehealth: Payer: Self-pay

## 2023-06-18 ENCOUNTER — Encounter: Payer: Self-pay | Admitting: Dermatology

## 2023-06-18 DIAGNOSIS — R768 Other specified abnormal immunological findings in serum: Secondary | ICD-10-CM

## 2023-06-18 NOTE — Progress Notes (Signed)
 Reviewed labs with Dr. Bary Likes. He requests that we order a HCV RNA from [facility] since patient has had two positive HCV AB tests. Requisition printed for patient and left at front desk check in. Discussed above with patient, she voiced understanding, and will have labs drawn ASAP pending weather.

## 2023-06-18 NOTE — Telephone Encounter (Signed)
 I called and discussed lab results with patient. She states that she has never had Hepatitis C or been treated for it before. She also had a positive Hep C AB when she had labs drawn by Dr. Lydia Sams (rheumatologist), but states that when she asked his office about it they told her it was nothing to be concerned about. Please advise.

## 2023-06-18 NOTE — Telephone Encounter (Signed)
 The lab I'm referring to was ordered by [facility] on 08/28/22. It's under the Care Everywhere tab. Please advise.

## 2023-06-18 NOTE — Telephone Encounter (Signed)
-----   Message from Celine Collard sent at 06/18/2023  2:03 PM EST ----- Pt had lab screening for possible systemic biologic treatment for Psoriasis Since last visit had been using oral Otezla  (tooexpensive for pt) and topical Vtama  Labs show: REACTIVE Hepatitis C antibody test   Please ask pt if she has had Hepatitis C in past and if so was it treated? Depending on pt answer, may just need to repeat test.  Hepatitis B and TB tests are Normal

## 2023-06-19 DIAGNOSIS — R768 Other specified abnormal immunological findings in serum: Secondary | ICD-10-CM | POA: Diagnosis not present

## 2023-06-20 ENCOUNTER — Encounter: Payer: Self-pay | Admitting: Dermatology

## 2023-06-20 ENCOUNTER — Telehealth: Payer: Self-pay

## 2023-06-20 LAB — HCV RNA DIAGNOSIS, NAA: HCV RNA, Quantitation: NOT DETECTED [IU]/mL

## 2023-06-20 NOTE — Telephone Encounter (Addendum)
Tried calling patient regarding results and recommendations. No answer. LM for patient to return call.   ----- Message from Armida Sans sent at 06/20/2023  4:35 PM EST ----- Hepatitis C RNA test = NONE detected = negative / normal  Pt has been on Otezla for Psoriasis but cannot afford it. Also has joint aches (Psoriatic arthritis?) We had discussed systemic biologic treatment. If pt interested in pursuing, may consider Skyrizi?  Pt should have appt to discuss and initiate treatment if she wants to pursue this. She has an appt in May 2025, but can e seen sooner

## 2023-06-21 ENCOUNTER — Telehealth: Payer: Self-pay

## 2023-06-21 NOTE — Telephone Encounter (Signed)
Patient returning our call, I discussed   Hepatitis C RNA test = NONE detected = negative / normal   Patient will wait until her appointment in May to discuss possibly starting Skyrizi, she still have some Otezla tablet remaining she will continue for now.

## 2023-06-21 NOTE — Telephone Encounter (Signed)
-----   Message from Armida Sans sent at 06/20/2023  4:35 PM EST ----- Hepatitis C RNA test = NONE detected = negative / normal  Pt has been on Otezla for Psoriasis but cannot afford it. Also has joint aches (Psoriatic arthritis?) We had discussed systemic biologic treatment. If pt interested in pursuing, may consider Skyrizi?  Pt should have appt to discuss and initiate treatment if she wants to pursue this. She has an appt in May 2025, but can e seen sooner

## 2023-06-26 ENCOUNTER — Encounter: Payer: Self-pay | Admitting: Physician Assistant

## 2023-06-26 ENCOUNTER — Ambulatory Visit: Payer: Medicare Other | Admitting: Physician Assistant

## 2023-06-26 VITALS — BP 129/83 | HR 73 | Temp 98.3°F | Ht 61.0 in | Wt 150.0 lb

## 2023-06-26 DIAGNOSIS — R058 Other specified cough: Secondary | ICD-10-CM

## 2023-06-26 DIAGNOSIS — R0981 Nasal congestion: Secondary | ICD-10-CM | POA: Diagnosis not present

## 2023-06-26 DIAGNOSIS — J302 Other seasonal allergic rhinitis: Secondary | ICD-10-CM

## 2023-06-26 DIAGNOSIS — J0120 Acute ethmoidal sinusitis, unspecified: Secondary | ICD-10-CM | POA: Diagnosis not present

## 2023-06-26 LAB — COVID-19, FLU A+B AND RSV
Covid Antigen, POC: NEGATIVE
FLU A: NEGATIVE
FLU B: NEGATIVE

## 2023-06-26 MED ORDER — FLUTICASONE PROPIONATE 50 MCG/ACT NA SUSP
2.0000 | Freq: Every day | NASAL | 6 refills | Status: DC
Start: 2023-06-26 — End: 2023-06-26

## 2023-06-26 MED ORDER — FLUTICASONE PROPIONATE 50 MCG/ACT NA SUSP: 2.0000 | Freq: Every day | NASAL | 11 refills | Status: AC | PRN

## 2023-06-26 MED ORDER — AZITHROMYCIN 250 MG PO TABS
ORAL_TABLET | ORAL | 0 refills | Status: AC
Start: 2023-06-26 — End: 2023-07-01

## 2023-06-26 MED ORDER — LEVOCETIRIZINE DIHYDROCHLORIDE 5 MG PO TABS: 5.0000 mg | ORAL_TABLET | Freq: Every evening | ORAL | 2 refills | Status: DC

## 2023-06-26 MED ORDER — BENZONATATE 200 MG PO CAPS: 200.0000 mg | ORAL_CAPSULE | Freq: Two times a day (BID) | ORAL | 0 refills | Status: DC | PRN

## 2023-06-26 NOTE — Progress Notes (Signed)
Established patient visit  Patient: Gina [name]   DOB: 02-May-1956   68 y.o. Female  MRN: [id number] Visit Date: 06/26/2023  Today's healthcare provider: Debera Lat, PA-C   Chief Complaint  Patient presents with   Cough  Subjective     Discussed the use of AI scribe software for clinical note transcription with the patient, who gave verbal consent to proceed.    Started about 1 week ago , coughing and nasal congestion, horniness ,  some hot and cold feeling , no body aches, taking otc allergy medication              06/26/2023    3:12 PM 12/28/2022   11:03 AM 09/22/2022    1:36 PM  Depression screen PHQ 2/9  Decreased Interest 0 0 0  Down, Depressed, Hopeless 0 0 0  PHQ - 2 Score 0 0 0  Altered sleeping 0  0  Tired, decreased energy 0  0  Change in appetite 0  0  Feeling bad or failure about yourself  0  0  Trouble concentrating 0  0  Moving slowly or fidgety/restless 0  0  Suicidal thoughts 0  0  PHQ-9 Score 0  0  Difficult doing work/chores Not difficult at all  Not difficult at all      06/26/2023    3:12 PM 12/28/2022   11:03 AM 01/17/2021    3:32 PM  GAD 7 : Generalized Anxiety Score  Nervous, Anxious, on Edge 0 0 0  Control/stop worrying 0 0 0  Worry too much - different things 0 0 0  Trouble relaxing 0 0 0  Restless 0 0 0  Easily annoyed or irritable 0 0 1  Afraid - awful might happen 0 0 0  Total GAD 7 Score 0 0 1  Anxiety Difficulty Not difficult at all Not difficult at all Not difficult at all    Medications: Outpatient Medications Prior to Visit  Medication Sig   ALPRAZolam (XANAX) 0.25 MG tablet Take 1 tablet (0.25 mg total) by mouth 2 (two) times daily as needed. Courtesy refill; due in for in office follow up   amLODipine (NORVASC) 2.5 MG tablet Take 3 tablets (7.5 mg total) by mouth daily.   atorvastatin (LIPITOR) 40 MG tablet Take 1 tablet (40 mg total) by mouth daily.   calcipotriene (DOVONOX) 0.005 % cream Apply topically daily. To  psoriasis as needed 7 days a week   celecoxib (CELEBREX) 100 MG capsule TAKE 1 CAPSULE BY MOUTH TWICE DAILY( EVERY TWELVE HOURS)   cetirizine (ZYRTEC) 10 MG tablet Take 10 mg by mouth daily as needed for allergies.   Cholecalciferol (VITAMIN D3) 1000 UNITS CAPS Take 1,000 Units by mouth at bedtime.    Glycopyrronium Tosylate (QBREXZA) 2.4 % PADS Apply 1 application  topically daily.   Halobetasol Propionate (BRYHALI) 0.01 % LOTN Apply 1 application. topically daily. Up to 5 days per week per flare   hydrocortisone (ANUSOL-HC) 2.5 % rectal cream Place 1 Application rectally 2 (two) times daily.   hydrocortisone 1 % ointment Apply 1 Application topically 2 (two) times daily.   ketoconazole (NIZORAL) 2 % cream APPLY UNDER THE BREAST DAILY   levothyroxine (SYNTHROID) 25 MCG tablet Take 1 tablet (25 mcg total) by mouth daily before breakfast. Labs due with CPE 08/2023   magic mouthwash (nystatin, lidocaine, diphenhydrAMINE, alum & mag hydroxide) suspension Swish and swallow 5 mLs 4 (four) times daily as needed for mouth pain.   Metamucil Fiber CHEW  Chew 1 each by mouth as directed.   miconazole (MICOTIN) 2 % cream Apply 1 Application topically 2 (two) times daily.   mineral oil-hydrophilic petrolatum (AQUAPHOR) ointment Apply topically as needed for dry skin.   OTEZLA 30 MG TABS TAKE 1 TABLET BY MOUTH 2 TIMES A DAY.   [DISCONTINUED] fluticasone (FLONASE) 50 MCG/ACT nasal spray Place 2 sprays into both nostrils daily as needed for rhinitis.   fluconazole (DIFLUCAN) 150 MG tablet Take 1 tablet (150 mg total) by mouth every three (3) days as needed for up to 2 doses.   No facility-administered medications prior to visit.    Review of Systems  All other systems reviewed and are negative.  All negative Except see HPI       Objective    BP 129/83   Pulse 73   Temp 98.3 F (36.8 C)   Ht 5\' 1"  (1.549 m)   Wt 150 lb (68 kg)   SpO2 98%   BMI 28.34 kg/m     Physical Exam Vitals reviewed.   Constitutional:      Appearance: She is normal weight.  HENT:     Head: Normocephalic and atraumatic.     Right Ear: Ear canal and external ear normal.     Left Ear: Ear canal and external ear normal.     Nose: Congestion and rhinorrhea present.     Mouth/Throat:     Pharynx: Posterior oropharyngeal erythema present.     Comments: Postnasal drainage noted Eyes:     General: No scleral icterus.       Right eye: No discharge.        Left eye: No discharge.     Extraocular Movements: Extraocular movements intact.     Pupils: Pupils are equal, round, and reactive to light.  Cardiovascular:     Rate and Rhythm: Normal rate and regular rhythm.  Pulmonary:     Effort: Pulmonary effort is normal.     Breath sounds: Normal breath sounds.  Abdominal:     General: Abdomen is flat. Bowel sounds are normal.     Palpations: Abdomen is soft.  Lymphadenopathy:     Cervical: No cervical adenopathy.  Neurological:     Mental Status: She is alert.      Results for orders placed or performed in visit on 06/26/23  COVID-19, Flu A+B and RSV  Result Value Ref Range   Covid Antigen, POC Negative Negative   FLU A neg    FLU B neg         Assessment and Plan    Cough with congestion of paranasal sinus (Primary) - COVID-19, Flu A+B and RSV - benzonatate (TESSALON) 200 MG capsule; Take 1 capsule (200 mg total) by mouth 2 (two) times daily as needed for cough.  Dispense: 20 capsule; Refill: 0 Other seasonal allergic rhinitis - fluticasone (FLONASE) 50 MCG/ACT nasal spray; Place 2 sprays into both nostrils daily as needed for rhinitis.  Dispense: 16 g; Refill: 11 Acute non-recurrent ethmoidal sinusitis - levocetirizine (XYZAL) 5 MG tablet; Take 1 tablet (5 mg total) by mouth every evening.  Dispense: 30 tablet; Refill: 2 - fluticasone (FLONASE) 50 MCG/ACT nasal spray; Place 2 sprays into both nostrils daily as needed for rhinitis.  Dispense: 16 g; Refill: 11 - azithromycin (ZITHROMAX) 250 MG  tablet; Take 2 tablets on day 1, then 1 tablet daily on days 2 through 5  Dispense: 6 tablet; Refill: 0  Upper Respiratory Infection/sinusitis Symptoms of nasal congestion, post-nasal  drainage, and cough for approximately 7 days. No fever, body aches, or shortness of breath. No history of asthma. Currently taking over-the-counter Zyrtec. -Continue Zyrtec. -Consider switching to Xyzal if symptoms do not improve. -Use nasal saline rinse or spray for congestion. -Start Flonase for inflammation. -Consider starting azithromycin if symptoms persist for more than 10 days or worsen.  Thyroid Enlargement Patient reports history of thyroid enlargement, currently managed with Levothyroxine. No recent imaging or specialist consultation. -Recommend thyroid evaluation at next physical.  General Health Maintenance -Reschedule physical examination.     Orders Placed This Encounter  Procedures   COVID-19, Flu A+B and RSV    Previously tested for COVID-19:   No    Resident in a congregate (group) care setting:   No    Is the patient student?:   No    Employed in healthcare setting:   No    Pregnant:   No    Has patient completed COVID vaccination(s) (2 doses of Pfizer/Moderna 1 dose of [employer]):   No    No follow-ups on file.   The patient was advised to call back or seek an in-person evaluation if the symptoms worsen or if the condition fails to improve as anticipated.  I discussed the assessment and treatment plan with the patient. The patient was provided an opportunity to ask questions and all were answered. The patient agreed with the plan and demonstrated an understanding of the instructions.  I, Debera Lat, PA-C have reviewed all documentation for this visit. The documentation on 06/26/2023  for the exam, diagnosis, procedures, and orders are all accurate and complete.  Debera Lat, [facility], MMS [facility] [phone] (phone) [phone] (fax)  [facility]

## 2023-07-31 ENCOUNTER — Encounter: Payer: Self-pay | Admitting: Physician Assistant

## 2023-07-31 ENCOUNTER — Ambulatory Visit (INDEPENDENT_AMBULATORY_CARE_PROVIDER_SITE_OTHER): Payer: Medicare Other | Admitting: Physician Assistant

## 2023-07-31 VITALS — BP 129/66 | HR 72 | Temp 98.0°F | Resp 16 | Ht 61.0 in | Wt 149.5 lb

## 2023-07-31 DIAGNOSIS — R399 Unspecified symptoms and signs involving the genitourinary system: Secondary | ICD-10-CM

## 2023-07-31 DIAGNOSIS — N390 Urinary tract infection, site not specified: Secondary | ICD-10-CM

## 2023-07-31 DIAGNOSIS — Z0000 Encounter for general adult medical examination without abnormal findings: Secondary | ICD-10-CM

## 2023-07-31 DIAGNOSIS — Z0001 Encounter for general adult medical examination with abnormal findings: Secondary | ICD-10-CM | POA: Diagnosis not present

## 2023-07-31 LAB — POCT URINALYSIS DIPSTICK
Blood, UA: NEGATIVE
Glucose, UA: NEGATIVE
Ketones, UA: NEGATIVE
Leukocytes, UA: NEGATIVE
Nitrite, UA: NEGATIVE
Protein, UA: POSITIVE — AB
Spec Grav, UA: 1.02 (ref 1.010–1.025)
Urobilinogen, UA: 0.2 U/dL
pH, UA: 6 (ref 5.0–8.0)
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
## 2023-08-01 ENCOUNTER — Telehealth: Payer: Self-pay

## 2023-08-01 NOTE — Telephone Encounter (Signed)
 Copied from CRM [id number]. Topic: General - Other >> Aug 01, 2023  3:43 PM Gina [name] S wrote: Reason for CRM: Patient has additional questions in regard to lab results. Requesting a callback

## 2023-08-02 ENCOUNTER — Encounter: Payer: Self-pay | Admitting: Physician Assistant

## 2023-08-09 ENCOUNTER — Other Ambulatory Visit: Payer: Self-pay | Admitting: Physician Assistant

## 2023-08-09 DIAGNOSIS — F411 Generalized anxiety disorder: Secondary | ICD-10-CM

## 2023-08-09 NOTE — Telephone Encounter (Signed)
 Copied from CRM [id number]. Topic: Clinical - Medication Refill >> Aug 09, 2023  8:46 AM Nyra Capes wrote: Patient called in requesting a medication refill. This is for anxiety   Most Recent Primary Care Visit:  Provider: Debera Lat  Department: [facility]  Visit Type: PHYSICAL  Date: 07/31/2023  Medication: ALPRAZolam (XANAX) 0.25 MG tablet  Has the patient contacted their pharmacy? Yes  pharmacy stated to have provider send in prescription due to new provider.  Is this the correct pharmacy for this prescription? Yes If no, delete pharmacy and type the correct one.  This is the patient's preferred pharmacy:   [id number],  - [address] AT [facility] [address] [place] [address] Phone: [phone] Fax: [phone]    Has the prescription been filled recently?  Unsure  Is the patient out of the medication? Yes  Has the patient been seen for an appointment in the last year OR does the patient have an upcoming appointment? Yes  Can we respond through MyChart? yes  Agent: Please be advised that Rx refills may take up to 3 business days. We ask that you follow-up with your pharmacy.  Patient phone # [phone] ok to leave detailed message

## 2023-08-10 NOTE — Telephone Encounter (Signed)
 Requested medication (s) are due for refill today - yes  Requested medication (s) are on the active medication list -yes  Future visit scheduled -yes  Last refill: 04/30/23 #30  Notes to clinic: non delegated Rx  Requested Prescriptions  Pending Prescriptions Disp Refills   ALPRAZolam (XANAX) 0.25 MG tablet 30 tablet 0    Sig: Take 1 tablet (0.25 mg total) by mouth 2 (two) times daily as needed. Courtesy refill; due in for in office follow up     Not Delegated - Psychiatry: Anxiolytics/Hypnotics 2 Failed - 08/10/2023  9:42 AM      Failed - This refill cannot be delegated      Failed - Urine Drug Screen completed in last 360 days      Passed - Patient is not pregnant      Passed - Valid encounter within last 6 months    Recent Outpatient Visits           1 week ago Encounter for initial preventive physical examination covered by [employer]   [place] [facility] [place], [place], PA-C   1 month ago Cough with congestion of paranasal sinus   [place] [facility] [place], [place], PA-C       Future Appointments             In 1 week Debera Lat, PA-C [facility], [facility]   In 1 month Deirdre Evener, MD [place] [facility]               Requested Prescriptions  Pending Prescriptions Disp Refills   ALPRAZolam (XANAX) 0.25 MG tablet 30 tablet 0    Sig: Take 1 tablet (0.25 mg total) by mouth 2 (two) times daily as needed. Courtesy refill; due in for in office follow up     Not Delegated - Psychiatry: Anxiolytics/Hypnotics 2 Failed - 08/10/2023  9:42 AM      Failed - This refill cannot be delegated      Failed - Urine Drug Screen completed in last 360 days      Passed - Patient is not pregnant      Passed - Valid encounter within last 6 months    Recent Outpatient Visits           1 week ago Encounter for initial preventive physical examination covered by [employer]   [place] [facility] [place], [place], PA-C   1 month ago Cough with congestion of paranasal sinus   [place] [facility] [place], [place], PA-C       Future Appointments             In 1 week Debera Lat, PA-C [facility], [facility]   In 1 month Deirdre Evener, MD [facility]

## 2023-08-10 NOTE — Telephone Encounter (Signed)
 Patient is asking for refill on Alprazolam to be seen to [facility] today please.

## 2023-08-11 ENCOUNTER — Other Ambulatory Visit: Payer: Self-pay | Admitting: Physician Assistant

## 2023-08-11 DIAGNOSIS — F411 Generalized anxiety disorder: Secondary | ICD-10-CM

## 2023-08-13 NOTE — Telephone Encounter (Signed)
 3rd Request from patient... ALPRAZolam (XANAX) 0.25 MG tablet

## 2023-08-13 NOTE — Telephone Encounter (Signed)
 Requested medication (s) are due for refill today- yes  Requested medication (s) are on the active medication list -yes  Future visit scheduled -yes  Last refill: 04/30/23 #30  Notes to clinic: non delegated Rx  Requested Prescriptions  Pending Prescriptions Disp Refills   ALPRAZolam (XANAX) 0.25 MG tablet [Pharmacy Med Name: ALPRAZOLAM 0.25MG  TABLETS] 30 tablet     Sig: TAKE 1 TABLET BY MOUTH TWICE DAILY AS NEEDED. COURTESY REFILL, DUE IN FOR IN FOR OFFICE FOLLOW UP     Not Delegated - Psychiatry: Anxiolytics/Hypnotics 2 Failed - 08/13/2023  3:34 PM      Failed - This refill cannot be delegated      Failed - Urine Drug Screen completed in last 360 days      Passed - Patient is not pregnant      Passed - Valid encounter within last 6 months    Recent Outpatient Visits           1 week ago Encounter for initial preventive physical examination covered by [employer]   [place] [facility] [place], [place], PA-C   1 month ago Cough with congestion of paranasal sinus   [place] [facility] [place], [place], PA-C       Future Appointments             In 1 week Debera Lat, PA-C [facility], [facility]   In 1 month Deirdre Evener, MD [place] [facility]               Requested Prescriptions  Pending Prescriptions Disp Refills   ALPRAZolam (XANAX) 0.25 MG tablet [Pharmacy Med Name: ALPRAZOLAM 0.25MG  TABLETS] 30 tablet     Sig: TAKE 1 TABLET BY MOUTH TWICE DAILY AS NEEDED. COURTESY REFILL, DUE IN FOR IN FOR OFFICE FOLLOW UP     Not Delegated - Psychiatry: Anxiolytics/Hypnotics 2 Failed - 08/13/2023  3:34 PM      Failed - This refill cannot be delegated      Failed - Urine Drug Screen completed in last 360 days      Passed - Patient is not pregnant      Passed - Valid encounter within last 6 months    Recent Outpatient Visits           1 week ago Encounter for initial preventive physical  examination covered by [employer]   [place] [facility] [place], [place], PA-C   1 month ago Cough with congestion of paranasal sinus   [place] [facility] [place], [place], PA-C       Future Appointments             In 1 week Debera Lat, PA-C [facility], [facility]   In 1 month Deirdre Evener, MD [facility]

## 2023-08-14 MED ORDER — ALPRAZOLAM 0.25 MG PO TABS
0.2500 mg | ORAL_TABLET | Freq: Two times a day (BID) | ORAL | 0 refills | Status: DC | PRN
Start: 2023-08-14 — End: 2023-09-12

## 2023-08-20 ENCOUNTER — Encounter: Payer: Self-pay | Admitting: Physician Assistant

## 2023-08-20 ENCOUNTER — Ambulatory Visit: Admitting: Physician Assistant

## 2023-08-20 VITALS — BP 130/68 | HR 64 | Resp 16 | Ht 61.0 in | Wt 148.6 lb

## 2023-08-20 DIAGNOSIS — E7849 Other hyperlipidemia: Secondary | ICD-10-CM

## 2023-08-20 DIAGNOSIS — J0120 Acute ethmoidal sinusitis, unspecified: Secondary | ICD-10-CM

## 2023-08-20 DIAGNOSIS — J0100 Acute maxillary sinusitis, unspecified: Secondary | ICD-10-CM

## 2023-08-20 DIAGNOSIS — R768 Other specified abnormal immunological findings in serum: Secondary | ICD-10-CM

## 2023-08-20 DIAGNOSIS — F1320 Sedative, hypnotic or anxiolytic dependence, uncomplicated: Secondary | ICD-10-CM

## 2023-08-20 DIAGNOSIS — I10 Essential (primary) hypertension: Secondary | ICD-10-CM

## 2023-08-20 DIAGNOSIS — E034 Atrophy of thyroid (acquired): Secondary | ICD-10-CM

## 2023-08-20 DIAGNOSIS — N200 Calculus of kidney: Secondary | ICD-10-CM

## 2023-08-20 DIAGNOSIS — J302 Other seasonal allergic rhinitis: Secondary | ICD-10-CM

## 2023-08-20 MED ORDER — LEVOTHYROXINE SODIUM 25 MCG PO TABS: 25.0000 ug | ORAL_TABLET | Freq: Every day | ORAL | 0 refills | Status: DC

## 2023-08-20 MED ORDER — AMLODIPINE BESYLATE 2.5 MG PO TABS: 7.5000 mg | ORAL_TABLET | Freq: Every day | ORAL | 3 refills | Status: AC

## 2023-08-20 MED ORDER — LEVOCETIRIZINE DIHYDROCHLORIDE 5 MG PO TABS
5.0000 mg | ORAL_TABLET | Freq: Every evening | ORAL | 2 refills | Status: DC
Start: 2023-08-20 — End: 2023-09-07

## 2023-08-20 MED ORDER — ATORVASTATIN CALCIUM 40 MG PO TABS: 40.0000 mg | ORAL_TABLET | Freq: Every day | ORAL | 3 refills | Status: AC

## 2023-08-20 NOTE — Progress Notes (Addendum)
 Established patient visit  Patient: Gina [name]   DOB: [birth year]   68 y.o. Female  MRN: [id number] Visit Date: 08/20/2023  Today's healthcare provider: Blane Bunting, PA-C   Chief Complaint  Patient presents with   Follow-up    4 week f/u  pt wants blood work and meds   Subjective     Discussed the use of AI scribe software for clinical note transcription with the patient, who gave verbal consent to proceed.  History of Present Illness The patient, with a history of anxiety, hypertension, hyperlipidemia, hypothyroidism, and sinus issues, presents for a routine follow-up. The patient is currently on multiple medications including alprazolam , amlodipine , levothyroxine , Lipitor, and Xyzal . The patient expresses concern about the continued use of alprazolam , which is being used for anxiety and sleep. The patient reports that she has been trying to decrease the use of alprazolam  and is open to exploring other options for managing anxiety. The patient also mentions a history of kidney stones and is aware of the need to modify her diet accordingly. The patient is due for blood work and needs refills for her medications.       08/20/2023    1:15 PM 07/31/2023   10:36 AM 06/26/2023    3:12 PM  Depression screen PHQ 2/9  Decreased Interest 0 0 0  Down, Depressed, Hopeless 0 0 0  PHQ - 2 Score 0 0 0  Altered sleeping 0 0 0  Tired, decreased energy 0 0 0  Change in appetite 0 0 0  Feeling bad or failure about yourself  0 0 0  Trouble concentrating 0 0 0  Moving slowly or fidgety/restless 0 0 0  Suicidal thoughts 0 0 0  PHQ-9 Score 0 0 0  Difficult doing work/chores Not difficult at all  Not difficult at all      08/20/2023    1:16 PM 07/31/2023   10:36 AM 06/26/2023    3:12 PM 12/28/2022   11:03 AM  GAD 7 : Generalized Anxiety Score  Nervous, Anxious, on Edge 0 0 0 0  Control/stop worrying 0 0 0 0  Worry too much - different things 0 0 0 0  Trouble relaxing 0 0 0 0  Restless 0  0 0 0  Easily annoyed or irritable 0 0 0 0  Afraid - awful might happen 0 0 0 0  Total GAD 7 Score 0 0 0 0  Anxiety Difficulty Not difficult at all Not difficult at all Not difficult at all Not difficult at all    Medications: Outpatient Medications Prior to Visit  Medication Sig   ALPRAZolam  (XANAX ) 0.25 MG tablet Take 1 tablet (0.25 mg total) by mouth 2 (two) times daily as needed. Courtesy refill; due in for in office follow up   amLODipine  (NORVASC ) 2.5 MG tablet Take 3 tablets (7.5 mg total) by mouth daily.   atorvastatin  (LIPITOR) 40 MG tablet Take 1 tablet (40 mg total) by mouth daily.   benzonatate  (TESSALON ) 200 MG capsule Take 1 capsule (200 mg total) by mouth 2 (two) times daily as needed for cough.   calcipotriene  (DOVONOX) 0.005 % cream Apply topically daily. To psoriasis as needed 7 days a week   celecoxib  (CELEBREX ) 100 MG capsule TAKE 1 CAPSULE BY MOUTH TWICE DAILY( EVERY TWELVE HOURS)   cetirizine (ZYRTEC) 10 MG tablet Take 10 mg by mouth daily as needed for allergies.   Cholecalciferol (VITAMIN D3) 1000 UNITS CAPS Take 1,000 Units by mouth at bedtime.  fluticasone  (FLONASE ) 50 MCG/ACT nasal spray Place 2 sprays into both nostrils daily as needed for rhinitis.   Glycopyrronium Tosylate  (QBREXZA ) 2.4 % PADS Apply 1 application  topically daily.   Halobetasol  Propionate (BRYHALI ) 0.01 % LOTN Apply 1 application. topically daily. Up to 5 days per week per flare   hydrocortisone  (ANUSOL -HC) 2.5 % rectal cream Place 1 Application rectally 2 (two) times daily.   hydrocortisone  1 % ointment Apply 1 Application topically 2 (two) times daily.   ketoconazole  (NIZORAL ) 2 % cream APPLY UNDER THE BREAST DAILY   levocetirizine (XYZAL ) 5 MG tablet Take 1 tablet (5 mg total) by mouth every evening.   levothyroxine  (SYNTHROID ) 25 MCG tablet Take 1 tablet (25 mcg total) by mouth daily before breakfast. Labs due with CPE 08/2023   magic mouthwash (nystatin , lidocaine , diphenhydrAMINE , alum &  mag hydroxide) suspension Swish and swallow 5 mLs 4 (four) times daily as needed for mouth pain.   Metamucil Fiber CHEW Chew 1 each by mouth as directed.   miconazole  (MICOTIN) 2 % cream Apply 1 Application topically 2 (two) times daily.   mineral oil-hydrophilic petrolatum (AQUAPHOR) ointment Apply topically as needed for dry skin.   OTEZLA  30 MG TABS TAKE 1 TABLET BY MOUTH 2 TIMES A DAY.   No facility-administered medications prior to visit.    Review of Systems All negative Except see HPI       Objective    BP 130/68 (BP Location: Left Arm, Patient Position: Sitting, Cuff Size: Normal)   Pulse 64   Resp 16   Ht 5\' 1"  (1.549 m)   Wt 148 lb 9.6 oz (67.4 kg)   SpO2 100%   BMI 28.08 kg/m     Physical Exam Vitals reviewed.  Constitutional:      General: She is not in acute distress.    Appearance: Normal appearance. She is well-developed. She is not diaphoretic.  HENT:     Head: Normocephalic and atraumatic.  Eyes:     General: No scleral icterus.    Conjunctiva/sclera: Conjunctivae normal.  Neck:     Thyroid : No thyromegaly.  Cardiovascular:     Rate and Rhythm: Normal rate and regular rhythm.     Pulses: Normal pulses.     Heart sounds: Normal heart sounds. No murmur heard. Pulmonary:     Effort: Pulmonary effort is normal. No respiratory distress.     Breath sounds: Normal breath sounds. No wheezing, rhonchi or rales.  Musculoskeletal:     Cervical back: Neck supple.     Right lower leg: No edema.     Left lower leg: No edema.  Lymphadenopathy:     Cervical: No cervical adenopathy.  Skin:    General: Skin is warm and dry.     Findings: No rash.  Neurological:     Mental Status: She is alert and oriented to person, place, and time. Mental status is at baseline.  Psychiatric:        Mood and Affect: Mood normal.        Behavior: Behavior normal.      No results found for any visits on 08/20/23.      Assessment & Plan Alprazolam  dependence Long-term  use risks include tolerance, cognitive impairment, and potential early Alzheimer's disease. Gradual tapering is crucial. - Prescribe alprazolam  with a tapering plan over two to three months. - Schedule monthly or bi-monthly follow-ups to monitor progress and adjust tapering as needed.  Hypertension Chronic Blood pressure slightly elevated, possibly due to  stress. Home readings around 120 mmHg. - Refill amlodipine  prescription. Adhere to lifestyle modifications Will follow-up  Hyperlipidemia  Chronic On Lipitor for elevated lipids. Regular lipid monitoring necessary. - Refill Lipitor prescription. - Order lipid panel to monitor cholesterol levels. Will follow-up  Hypothyroidism Chronic On levothyroxine  for energy levels and well-being. - Refill levothyroxine  prescription. Will follow-up  Sinusitis/Allergic rhinitis Xyzal  more effective than Zyrtec for symptom management. - Refill Xyzal  prescription.  Kidney stones Calcium -based stones. Dietary management should be discussed with a urologist. - Provide dietary recommendations for calcium -based kidney stones. - Encourage consultation with a urologist for dietary management.  Hepatitis C antibody positive Positive antibody test with negative viral load suggests past exposure with immunity or false positive. Reassurance provided.  General Health Maintenance Discussed importance of multivitamins and Coenzyme Q10 with statin therapy. - Recommend Centrum multivitamins. - Recommend Coenzyme Q10 supplements. - Continue vitamin D3 supplements.  Follow-up Regular follow-ups needed for alprazolam  tapering and health management. - Schedule follow-up in June post-vacation. - Ensure monthly or bi-monthly follow-ups for alprazolam  tapering. Acute non-recurrent ethmoidal sinusitis - levocetirizine (XYZAL ) 5 MG tablet; Take 1 tablet (5 mg total) by mouth every evening.  Dispense: 30 tablet; Refill: 2  Hypothyroidism due to acquired  atrophy of thyroid  - levothyroxine  (SYNTHROID ) 25 MCG tablet; Take 1 tablet (25 mcg total) by mouth daily before breakfast. Labs due with CPE 08/2023  Dispense: 120 tablet; Refill: 0  Primary hypertension (Primary) - amLODipine  (NORVASC ) 2.5 MG tablet; Take 3 tablets (7.5 mg total) by mouth daily.  Dispense: 270 tablet; Refill: 3 - atorvastatin  (LIPITOR) 40 MG tablet; Take 1 tablet (40 mg total) by mouth daily.  Dispense: 90 tablet; Refill: 3 - levocetirizine (XYZAL ) 5 MG tablet; Take 1 tablet (5 mg total) by mouth every evening.  Dispense: 30 tablet; Refill: 2 - levothyroxine  (SYNTHROID ) 25 MCG tablet; Take 1 tablet (25 mcg total) by mouth daily before breakfast. Labs due with CPE 08/2023  Dispense: 120 tablet; Refill: 0 - TSH - Hemoglobin A1c - Lipid panel - CBC with Differential/Platelet - Comprehensive metabolic panel with GFR  No orders of the defined types were placed in this encounter.   Return in about 8 weeks (around 10/15/2023) for chronic disease f/u.   The patient was advised to call back or seek an in-person evaluation if the symptoms worsen or if the condition fails to improve as anticipated.  I discussed the assessment and treatment plan with the patient. The patient was provided an opportunity to ask questions and all were answered. The patient agreed with the plan and demonstrated an understanding of the instructions.  I, Joffre Lucks, PA-C have reviewed all documentation for this visit. The documentation on 08/20/2023  for the exam, diagnosis, procedures, and orders are all accurate and complete.  Blane Bunting, [facility], MMS [facility] [phone] (phone) [phone] (fax)  [facility]

## 2023-08-21 LAB — CBC WITH DIFFERENTIAL/PLATELET
Basophils Absolute: 0.1 10*3/uL (ref 0.0–0.2)
Basos: 1 %
EOS (ABSOLUTE): 0.2 10*3/uL (ref 0.0–0.4)
Eos: 3 %
Hematocrit: 39.3 % (ref 34.0–46.6)
Hemoglobin: 13.3 g/dL (ref 11.1–15.9)
Immature Grans (Abs): 0 10*3/uL (ref 0.0–0.1)
Immature Granulocytes: 0 %
Lymphocytes Absolute: 1.5 10*3/uL (ref 0.7–3.1)
Lymphs: 27 %
MCH: 31.5 pg (ref 26.6–33.0)
MCHC: 33.8 g/dL (ref 31.5–35.7)
MCV: 93 fL (ref 79–97)
Monocytes Absolute: 0.4 10*3/uL (ref 0.1–0.9)
Monocytes: 8 %
Neutrophils Absolute: 3.2 10*3/uL (ref 1.4–7.0)
Neutrophils: 61 %
Platelets: 277 10*3/uL (ref 150–450)
RBC: 4.22 x10E6/uL (ref 3.77–5.28)
RDW: 12.9 % (ref 11.7–15.4)
WBC: 5.3 10*3/uL (ref 3.4–10.8)

## 2023-08-21 LAB — LIPID PANEL
Chol/HDL Ratio: 2.2 ratio (ref 0.0–4.4)
Cholesterol, Total: 141 mg/dL (ref 100–199)
HDL: 64 mg/dL (ref >39)
LDL Chol Calc (NIH): 59 mg/dL (ref 0–99)
Triglycerides: 95 mg/dL (ref 0–149)
VLDL Cholesterol Cal: 18 mg/dL (ref 5–40)

## 2023-08-21 LAB — COMPREHENSIVE METABOLIC PANEL WITH GFR
ALT: 28 IU/L (ref 0–32)
AST: 27 IU/L (ref 0–40)
Albumin: 4.5 g/dL (ref 3.9–4.9)
Alkaline Phosphatase: 179 IU/L — ABNORMAL HIGH (ref 44–121)
BUN/Creatinine Ratio: 18 (ref 12–28)
BUN: 12 mg/dL (ref 8–27)
Bilirubin Total: 0.4 mg/dL (ref 0.0–1.2)
CO2: 24 mmol/L (ref 20–29)
Calcium: 9.9 mg/dL (ref 8.7–10.3)
Chloride: 104 mmol/L (ref 96–106)
Creatinine, Ser: 0.65 mg/dL (ref 0.57–1.00)
Globulin, Total: 2.6 g/dL (ref 1.5–4.5)
Glucose: 89 mg/dL (ref 70–99)
Potassium: 4.5 mmol/L (ref 3.5–5.2)
Sodium: 142 mmol/L (ref 134–144)
Total Protein: 7.1 g/dL (ref 6.0–8.5)
eGFR: 98 mL/min/{1.73_m2} (ref >59)

## 2023-08-21 LAB — HEMOGLOBIN A1C
Est. average glucose Bld gHb Est-mCnc: 114 mg/dL
Hgb A1c MFr Bld: 5.6 % (ref 4.8–5.6)

## 2023-08-21 LAB — TSH: TSH: 1.61 u[IU]/mL (ref 0.450–4.500)

## 2023-08-22 DIAGNOSIS — R768 Other specified abnormal immunological findings in serum: Secondary | ICD-10-CM | POA: Insufficient documentation

## 2023-08-22 DIAGNOSIS — E034 Atrophy of thyroid (acquired): Secondary | ICD-10-CM | POA: Insufficient documentation

## 2023-08-22 DIAGNOSIS — E7849 Other hyperlipidemia: Secondary | ICD-10-CM | POA: Insufficient documentation

## 2023-08-22 DIAGNOSIS — N200 Calculus of kidney: Secondary | ICD-10-CM | POA: Insufficient documentation

## 2023-08-22 DIAGNOSIS — J302 Other seasonal allergic rhinitis: Secondary | ICD-10-CM | POA: Insufficient documentation

## 2023-08-22 DIAGNOSIS — F1320 Sedative, hypnotic or anxiolytic dependence, uncomplicated: Secondary | ICD-10-CM | POA: Insufficient documentation

## 2023-08-27 ENCOUNTER — Encounter: Payer: Self-pay | Admitting: Physician Assistant

## 2023-09-04 ENCOUNTER — Ambulatory Visit: Admitting: Physician Assistant

## 2023-09-07 ENCOUNTER — Other Ambulatory Visit: Payer: Self-pay | Admitting: Physician Assistant

## 2023-09-07 DIAGNOSIS — J0120 Acute ethmoidal sinusitis, unspecified: Secondary | ICD-10-CM

## 2023-09-07 DIAGNOSIS — I10 Essential (primary) hypertension: Secondary | ICD-10-CM

## 2023-09-07 DIAGNOSIS — F411 Generalized anxiety disorder: Secondary | ICD-10-CM

## 2023-09-07 NOTE — Telephone Encounter (Signed)
 Copied from CRM [id number]. Topic: Clinical - Medication Refill >> Sep 07, 2023 11:14 AM Zipporah Him wrote: Most Recent Primary Care Visit:  Provider: OSTWALT, JANNA  Department: [facility]  Visit Type: OFFICE VISIT  Date: 08/20/2023  Medication: levocetirizine (XYZAL ) 5 MG tablet, ALPRAZolam  (XANAX ) 0.25 MG tablet,   Has the patient contacted their pharmacy? No Filling early  Is this the correct pharmacy for this prescription? Yes If no, delete pharmacy and type the correct one.  This is the patient's preferred pharmacy:  [id number] [place] - [address] AT [facility] [address] [place] [address] Phone: [phone] Fax: [phone]     Has the prescription been filled recently? No  Is the patient out of the medication? No  Has the patient been seen for an appointment in the last year OR does the patient have an upcoming appointment? Yes  Can we respond through MyChart? No  Agent: Please be advised that Rx refills may take up to 3 business days. We ask that you follow-up with your pharmacy.

## 2023-09-10 NOTE — Telephone Encounter (Signed)
 Requested medication (s) are due for refill today: yes  Requested medication (s) are on the active medication list: yes  Last refill:  08/14/23  Future visit scheduled: no  Notes to clinic:  Unable to refill per protocol, cannot delegate.      Requested Prescriptions  Pending Prescriptions Disp Refills   ALPRAZolam  (XANAX ) 0.25 MG tablet 30 tablet 0    Sig: Take 1 tablet (0.25 mg total) by mouth 2 (two) times daily as needed. Courtesy refill; due in for in office follow up     Not Delegated - Psychiatry: Anxiolytics/Hypnotics 2 Failed - 09/10/2023  4:25 PM      Failed - This refill cannot be delegated      Failed - Urine Drug Screen completed in last 360 days      Passed - Patient is not pregnant      Passed - Valid encounter within last 6 months    Recent Outpatient Visits           3 weeks ago Primary hypertension   [facility] [place], [place], PA-C   1 month ago Encounter for initial preventive physical examination covered by [facility] [place], [place], PA-C   2 months ago Cough with congestion of paranasal sinus   [facility] [place], [place], PA-C       Future Appointments             In 1 week Elta Halter, MD [facility]             levocetirizine (XYZAL ) 5 MG tablet 30 tablet 2    Sig: Take 1 tablet (5 mg total) by mouth every evening.     Ear, Nose, and Throat:  Antihistamines - levocetirizine dihydrochloride  Passed - 09/10/2023  4:25 PM      Passed - Cr in normal range and within 360 days    Creatinine, Ser  Date Value Ref Range Status  08/20/2023 0.65 0.57 - 1.00 mg/dL Final         Passed - eGFR is 10 or above and within 360 days    GFR calc Af Amer  Date Value Ref Range Status  08/01/2019 101 >59 mL/min/1.73 Final   GFR, Estimated  Date Value Ref Range Status  06/21/2021 >60 >60 mL/min Final    Comment:    (NOTE) Calculated  using the CKD-EPI Creatinine Equation (2021)    eGFR  Date Value Ref Range Status  08/20/2023 98 >59 mL/min/1.73 Final         Passed - Valid encounter within last 12 months    Recent Outpatient Visits           3 weeks ago Primary hypertension   [facility] [place] [facility] [place], [place], PA-C   1 month ago Encounter for initial preventive physical examination covered by [facility] [place], [place], PA-C   2 months ago Cough with congestion of paranasal sinus   [facility] [place], [place], PA-C       Future Appointments             In 1 week Elta Halter, MD [facility]

## 2023-09-12 ENCOUNTER — Encounter: Payer: Self-pay | Admitting: Family Medicine

## 2023-09-12 ENCOUNTER — Ambulatory Visit (INDEPENDENT_AMBULATORY_CARE_PROVIDER_SITE_OTHER): Admitting: Family Medicine

## 2023-09-12 ENCOUNTER — Other Ambulatory Visit (HOSPITAL_COMMUNITY)
Admission: RE | Admit: 2023-09-12 | Discharge: 2023-09-12 | Disposition: A | Discharge status: Home / Self Care | Source: Ambulatory Visit | Attending: Family Medicine | Admitting: Family Medicine

## 2023-09-12 ENCOUNTER — Other Ambulatory Visit: Payer: Self-pay | Admitting: Physician Assistant

## 2023-09-12 ENCOUNTER — Telehealth: Payer: Self-pay | Admitting: Physician Assistant

## 2023-09-12 VITALS — BP 121/75 | HR 67 | Ht 61.0 in | Wt 149.0 lb

## 2023-09-12 DIAGNOSIS — N898 Other specified noninflammatory disorders of vagina: Secondary | ICD-10-CM | POA: Insufficient documentation

## 2023-09-12 DIAGNOSIS — F411 Generalized anxiety disorder: Secondary | ICD-10-CM

## 2023-09-12 MED ORDER — TRIAMCINOLONE ACETONIDE 0.1 % EX OINT
1.0000 | TOPICAL_OINTMENT | Freq: Two times a day (BID) | CUTANEOUS | 0 refills | Status: AC
Start: 2023-09-12 — End: ?

## 2023-09-12 MED ORDER — LEVOCETIRIZINE DIHYDROCHLORIDE 5 MG PO TABS: 5.0000 mg | ORAL_TABLET | Freq: Every evening | ORAL | 2 refills | Status: DC

## 2023-09-12 MED ORDER — ALPRAZOLAM 0.25 MG PO TABS: 0.2500 mg | ORAL_TABLET | Freq: Two times a day (BID) | ORAL | 1 refills | Status: DC | PRN

## 2023-09-12 MED ORDER — FLUCONAZOLE 150 MG PO TABS
ORAL_TABLET | ORAL | 0 refills | Status: DC
Start: 2023-09-12 — End: 2023-11-06

## 2023-09-12 NOTE — Telephone Encounter (Signed)
 Pt called about her refill requests, says she requested this over 4 days ago.

## 2023-09-12 NOTE — Telephone Encounter (Signed)
 Patient is requesting refill prescription  ALPRAZolam  (XANAX ) 0.25 MG tablet   Zero refills remaining currently but just saw Verdia Glad today Acute visit Please advise
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
## 2023-09-14 ENCOUNTER — Encounter: Payer: Self-pay | Admitting: Family Medicine

## 2023-09-14 LAB — CERVICOVAGINAL ANCILLARY ONLY
Bacterial Vaginitis (gardnerella): NEGATIVE
Candida Glabrata: NEGATIVE
Candida Vaginitis: POSITIVE — AB
Comment: NEGATIVE
Comment: NEGATIVE
Comment: NEGATIVE

## 2023-09-19 ENCOUNTER — Ambulatory Visit: Payer: Medicare Other | Admitting: Dermatology

## 2023-09-19 DIAGNOSIS — L400 Psoriasis vulgaris: Secondary | ICD-10-CM

## 2023-09-19 DIAGNOSIS — L304 Erythema intertrigo: Secondary | ICD-10-CM | POA: Diagnosis not present

## 2023-09-19 DIAGNOSIS — L409 Psoriasis, unspecified: Secondary | ICD-10-CM

## 2023-09-19 DIAGNOSIS — Z79899 Other long term (current) drug therapy: Secondary | ICD-10-CM

## 2023-09-19 DIAGNOSIS — L821 Other seborrheic keratosis: Secondary | ICD-10-CM

## 2023-09-19 DIAGNOSIS — L578 Other skin changes due to chronic exposure to nonionizing radiation: Secondary | ICD-10-CM

## 2023-09-19 DIAGNOSIS — L408 Other psoriasis: Secondary | ICD-10-CM

## 2023-09-19 DIAGNOSIS — L820 Inflamed seborrheic keratosis: Secondary | ICD-10-CM | POA: Diagnosis not present

## 2023-09-19 DIAGNOSIS — W908XXA Exposure to other nonionizing radiation, initial encounter: Secondary | ICD-10-CM

## 2023-09-19 DIAGNOSIS — Z7189 Other specified counseling: Secondary | ICD-10-CM

## 2023-09-19 MED ORDER — SKYRIZI PEN 150 MG/ML ~~LOC~~ SOAJ
150.0000 mg | SUBCUTANEOUS | 1 refills | Status: DC
Start: 2023-09-19 — End: 2023-10-03

## 2023-09-19 NOTE — Progress Notes (Unsigned)
 Follow-Up Visit   Subjective  Gina [name] is a 68 y.o. female who presents for the following: Psoriasis - pt currently using Otezla  30 mg po QD, tolerating medication well, with no side effects. Although Otezla  has been keeping patient's condition under control it is not an affordable option for patient and she would like to discuss Skyrizi.   The following portions of the chart were reviewed this encounter and updated as appropriate: medications, allergies, medical history  Review of Systems:  No other skin or systemic complaints except as noted in HPI or Assessment and Plan.  Objective  Well appearing patient in no apparent distress; mood and affect are within normal limits.  Areas Examined: The face, arms, legs, and trunk  Relevant exam findings are noted in the Assessment and Plan.  R side x 2, L upper eyelid x 1 (3) Erythematous stuck-on, waxy papule or plaque  Assessment & Plan   PSORIASIS VULGARIS   Related Medications risankizumab-rzaa (SKYRIZI PEN) 150 MG/ML pen Inject 1 mL (150 mg total) into the skin as directed. At weeks 0 & 4. risankizumab-rzaa (SKYRIZI PEN) 150 MG/ML pen Inject 1 mL (150 mg total) into the skin as directed. Every 12 weeks for maintenance. INFLAMED SEBORRHEIC KERATOSIS (3) R side x 2, L upper eyelid x 1 (3) Symptomatic, irritating, patient would like treated.  Destruction of lesion - R side x 2, L upper eyelid x 1 (3) Complexity: simple   Destruction method: cryotherapy   Informed consent: discussed and consent obtained   Timeout:  patient name, date of birth, surgical site, and procedure verified Lesion destroyed using liquid nitrogen: Yes   Region frozen until ice ball extended beyond lesion: Yes   Outcome: patient tolerated procedure well with no complications   Post-procedure details: wound care instructions given   PSORIASIS   Related Medications OTEZLA  30 MG TABS TAKE 1 TABLET BY MOUTH 2 TIMES A DAY. COUNSELING AND  COORDINATION OF CARE   MEDICATION MANAGEMENT   LONG-TERM USE OF HIGH-RISK MEDICATION   ACTINIC SKIN DAMAGE   SEBORRHEIC KERATOSIS    PSORIASIS with inverse psoriasis and intertrigo with possible psoriatic arthritis (joint aches) - reviewed labs (06/06/23 and 08/20/23), no risk factors for HIV per patient. Labs drawn for HIV antibody 07/24/2018. Hepatitis C antibody positive, but HCV RNA negative.  Well-demarcated erythematous papules/plaques with silvery scale, guttate pink scaly papules. 6% BSA on Otezla  treatment. Chronic condition with duration or expected duration over one year. Currently well-controlled with Otezla , but medication is not affordable for patient. Treatment Plan: D/C Otezla  due to cost. We will send in Skyrizi to see coverage, if patient doing well off of Otezla  she may call to start Skyrizi if/when flare occurs (if [employer] covered). Counseling on psoriasis and coordination of care  psoriasis is a chronic non-curable, but treatable genetic/hereditary disease that may have other systemic features affecting other organ systems such as joints (Psoriatic Arthritis). It is associated with an increased risk of inflammatory bowel disease, heart disease, non-alcoholic fatty liver disease, and depression.  Treatments include light and laser treatments; topical medications; and systemic medications including oral and injectables.  SEBORRHEIC KERATOSIS - Stuck-on, waxy, tan-brown papules and/or plaques  - Benign-appearing - Discussed benign etiology and prognosis. - Observe - Call for any changes  ACTINIC DAMAGE - chronic, secondary to cumulative UV radiation exposure/sun exposure over time - diffuse scaly erythematous macules with underlying dyspigmentation - Recommend daily broad spectrum sunscreen SPF 30+ to sun-exposed areas, reapply every 2 hours as  needed.  - Recommend staying in the shade or wearing long sleeves, sun glasses (UVA+UVB protection) and wide brim hats  (4-inch brim around the entire circumference of the hat). - Call for new or changing lesions.  Return in about 4 months (around 01/20/2024) for psoriasis follow up.  Arlinda Lais, CMA, am acting as scribe for Celine Collard, MD .   Docuentation: I have reviewed the above documentation for accuracy and completeness, and I agree with the above.  Celine Collard, MD

## 2023-09-19 NOTE — Patient Instructions (Signed)

## 2023-09-20 ENCOUNTER — Encounter: Payer: Self-pay | Admitting: Dermatology

## 2023-09-25 DIAGNOSIS — K080 Exfoliation of teeth due to systemic causes: Secondary | ICD-10-CM | POA: Diagnosis not present

## 2023-09-27 DIAGNOSIS — L409 Psoriasis, unspecified: Secondary | ICD-10-CM | POA: Diagnosis not present

## 2023-09-27 DIAGNOSIS — L4050 Arthropathic psoriasis, unspecified: Secondary | ICD-10-CM | POA: Diagnosis not present

## 2023-10-03 ENCOUNTER — Other Ambulatory Visit: Payer: Self-pay

## 2023-10-03 DIAGNOSIS — L400 Psoriasis vulgaris: Secondary | ICD-10-CM

## 2023-10-03 MED ORDER — SKYRIZI PEN 150 MG/ML ~~LOC~~ SOAJ: 150.0000 mg | SUBCUTANEOUS | 1 refills | Status: DC

## 2023-10-03 MED ORDER — SKYRIZI PEN 150 MG/ML ~~LOC~~ SOAJ: 150.0000 mg | SUBCUTANEOUS | 1 refills | Status: AC

## 2023-10-18 ENCOUNTER — Encounter: Payer: Self-pay | Admitting: Physician Assistant

## 2023-10-18 ENCOUNTER — Ambulatory Visit (INDEPENDENT_AMBULATORY_CARE_PROVIDER_SITE_OTHER): Admitting: Physician Assistant

## 2023-10-18 VITALS — BP 124/75 | HR 77 | Resp 16 | Wt 150.0 lb

## 2023-10-18 DIAGNOSIS — I10 Essential (primary) hypertension: Secondary | ICD-10-CM

## 2023-10-18 DIAGNOSIS — R768 Other specified abnormal immunological findings in serum: Secondary | ICD-10-CM | POA: Insufficient documentation

## 2023-10-18 DIAGNOSIS — N898 Other specified noninflammatory disorders of vagina: Secondary | ICD-10-CM | POA: Insufficient documentation

## 2023-10-18 DIAGNOSIS — E7849 Other hyperlipidemia: Secondary | ICD-10-CM | POA: Diagnosis not present

## 2023-10-18 DIAGNOSIS — L409 Psoriasis, unspecified: Secondary | ICD-10-CM | POA: Insufficient documentation

## 2023-10-18 DIAGNOSIS — E034 Atrophy of thyroid (acquired): Secondary | ICD-10-CM

## 2023-10-18 NOTE — Progress Notes (Signed)
 Established patient visit  Patient: Gina [name]   DOB: 26-Dec-1955   68 y.o. Female  MRN: [id number] Visit Date: 10/18/2023  Today's healthcare provider: Blane Bunting, PA-C   Chief Complaint  Patient presents with   Follow-up    F/u.Gina AasSwelling legs    Subjective     HPI     Follow-up    Additional comments: F/u.Gina AasSwelling legs       Last edited by Estill Hemming, CMA on 10/18/2023  9:37 AM.       Discussed the use of AI scribe software for clinical note transcription with the patient, who gave verbal consent to proceed.  History of Present Illness  Discussed the use of AI scribe software for clinical note transcription with the patient, who gave verbal consent to proceed.  History of Present Illness   Gina [name] is a 68 year old female with psoriasis and hypertension who presents with concerns about liver function and recent swelling in her feet.  She experiences significant vaginal itching and was treated for a yeast infection with a cream and antifungal tablets about a month ago. She is concerned about the impact of these medications on her liver function. She has a history of elevated liver enzymes and previous tests showed a reactive hepatitis C antibody but negative HCV. She recalls mixed results from past liver function tests and is worried about the implications.  She has not taken psoriasis medication for a couple of months, and her joint pain, especially in her foot, has worsened since discontinuing Otezla . She notes swelling in her feet, which she attributes to recent physical activity and travel.  She is currently on medication for blood pressure and cholesterol and is tapering her use of alprazolam . She takes medication for constipation and uses Miralax as needed. She is increasing her water intake and reducing sugary drinks. No dizziness, headache, or blurry vision.           10/18/2023   10:55 AM 08/20/2023    1:15 PM 07/31/2023   10:36 AM   Depression screen PHQ 2/9  Decreased Interest 0 0 0  Down, Depressed, Hopeless 0 0 0  PHQ - 2 Score 0 0 0  Altered sleeping 0 0 0  Tired, decreased energy 0 0 0  Change in appetite 0 0 0  Feeling bad or failure about yourself  0 0 0  Trouble concentrating 0 0 0  Moving slowly or fidgety/restless 0 0 0  Suicidal thoughts 0 0 0  PHQ-9 Score 0 0 0  Difficult doing work/chores Not difficult at all Not difficult at all       10/18/2023   10:55 AM 08/20/2023    1:16 PM 07/31/2023   10:36 AM 06/26/2023    3:12 PM  GAD 7 : Generalized Anxiety Score  Nervous, Anxious, on Edge 0 0 0 0  Control/stop worrying 0 0 0 0  Worry too much - different things 0 0 0 0  Trouble relaxing 0 0 0 0  Restless 0 0 0 0  Easily annoyed or irritable 0 0 0 0  Afraid - awful might happen 0 0 0 0  Total GAD 7 Score 0 0 0 0  Anxiety Difficulty Not difficult at all Not difficult at all Not difficult at all Not difficult at all    Medications: Outpatient Medications Prior to Visit  Medication Sig   ALPRAZolam  (XANAX ) 0.25 MG tablet Take 1 tablet (0.25 mg total) by mouth 2 (two) times daily  as needed. Courtesy refill; due in for in office follow up with PCP   amLODipine  (NORVASC ) 2.5 MG tablet Take 3 tablets (7.5 mg total) by mouth daily.   atorvastatin  (LIPITOR) 40 MG tablet Take 1 tablet (40 mg total) by mouth daily.   benzonatate  (TESSALON ) 200 MG capsule Take 1 capsule (200 mg total) by mouth 2 (two) times daily as needed for cough.   calcipotriene  (DOVONOX) 0.005 % cream Apply topically daily. To psoriasis as needed 7 days a week   celecoxib  (CELEBREX ) 100 MG capsule TAKE 1 CAPSULE BY MOUTH TWICE DAILY( EVERY TWELVE HOURS)   cetirizine (ZYRTEC) 10 MG tablet Take 10 mg by mouth daily as needed for allergies.   Cholecalciferol (VITAMIN D3) 1000 UNITS CAPS Take 1,000 Units by mouth at bedtime.    fluconazole  (DIFLUCAN ) 150 MG tablet Take one 150mg  tablet today and repeat after 72 hours if symptoms persist    fluticasone  (FLONASE ) 50 MCG/ACT nasal spray Place 2 sprays into both nostrils daily as needed for rhinitis.   Glycopyrronium Tosylate  (QBREXZA ) 2.4 % PADS Apply 1 application  topically daily.   Halobetasol  Propionate (BRYHALI ) 0.01 % LOTN Apply 1 application. topically daily. Up to 5 days per week per flare   hydrocortisone  (ANUSOL -HC) 2.5 % rectal cream Place 1 Application rectally 2 (two) times daily.   ketoconazole  (NIZORAL ) 2 % cream APPLY UNDER THE BREAST DAILY   levocetirizine (XYZAL ) 5 MG tablet Take 1 tablet (5 mg total) by mouth every evening.   levothyroxine  (SYNTHROID ) 25 MCG tablet Take 1 tablet (25 mcg total) by mouth daily before breakfast. Labs due with CPE 08/2023   magic mouthwash (nystatin , lidocaine , diphenhydrAMINE , alum & mag hydroxide) suspension Swish and swallow 5 mLs 4 (four) times daily as needed for mouth pain.   Metamucil Fiber CHEW Chew 1 each by mouth as directed.   miconazole  (MICOTIN) 2 % cream Apply 1 Application topically 2 (two) times daily.   mineral oil-hydrophilic petrolatum (AQUAPHOR) ointment Apply topically as needed for dry skin.   OTEZLA  30 MG TABS TAKE 1 TABLET BY MOUTH 2 TIMES A DAY.   risankizumab -rzaa (SKYRIZI  PEN) 150 MG/ML pen Inject 1 mL (150 mg total) into the skin as directed. At weeks 0 & 4.   risankizumab -rzaa (SKYRIZI  PEN) 150 MG/ML pen Inject 1 mL (150 mg total) into the skin as directed. Every 12 weeks for maintenance.   triamcinolone  ointment (KENALOG ) 0.1 % Apply 1 Application topically 2 (two) times daily.   No facility-administered medications prior to visit.    Review of Systems All negative Except see HPI       Objective    BP 124/75 (BP Location: Left Arm, Patient Position: Sitting)   Pulse 77   Resp 16   Wt 150 lb (68 kg)   SpO2 100%   BMI 28.34 kg/m     Physical Exam Vitals reviewed.  Constitutional:      General: She is not in acute distress.    Appearance: Normal appearance. She is well-developed. She is not  diaphoretic.  HENT:     Head: Normocephalic and atraumatic.   Eyes:     General: No scleral icterus.    Conjunctiva/sclera: Conjunctivae normal.   Neck:     Thyroid : No thyromegaly.   Cardiovascular:     Rate and Rhythm: Normal rate and regular rhythm.     Pulses: Normal pulses.     Heart sounds: Normal heart sounds. No murmur heard. Pulmonary:     Effort: Pulmonary  effort is normal. No respiratory distress.     Breath sounds: Normal breath sounds. No wheezing, rhonchi or rales.   Musculoskeletal:     Cervical back: Neck supple.     Right lower leg: No edema.     Left lower leg: No edema.  Lymphadenopathy:     Cervical: No cervical adenopathy.   Skin:    General: Skin is warm and dry.     Findings: No rash.   Neurological:     Mental Status: She is alert and oriented to person, place, and time. Mental status is at baseline.   Psychiatric:        Mood and Affect: Mood normal.        Behavior: Behavior normal.      No results found for any visits on 10/18/23.      Assessment & Plan      Vaginal Yeast Infection Severe itching due to yeast infection. Oral antifungal taken for two days. Discussed liver impact of oral antifungals, recommended topical treatment. - Use Monistat  7 for topical treatment. - Avoid excessive use of oral antifungal medications.  Liver Function Concerns Previously elevated liver enzymes now normal. Reactive hepatitis C antibody indicates past exposure without active infection. - Repeat metabolic panel to check liver function and alkaline phosphatase levels. will add GGT if needed Will fu   Psoriasis chronic Psoriasis untreated for months. Previously on Otezla . Discussed Skyrizi , hesitant due to insurance and cost. Approved for assistance, prefers to wait for liver test results. - Consider starting Skyrizi  after confirming liver function stability. - Discuss insurance coverage and cost assistance for Skyrizi . will fu  Thyroid   Enlargement Symptoms suggest thyroid  issues. Slight asymmetric enlargement noted. Recommended further evaluation despite normal TSH. - Order thyroid  ultrasound to evaluate for abnormalities by the end of the year due to insurance consideration will fu  Hypertension Chronic and stable Blood pressure well-controlled with medication. Continue current med regimen Will follow-up  Hyperlipidemia Chronic Stable On medication for cholesterol management. - Repeat lipid panel. Will follow-up  Foot and Ankle Swelling Swelling likely due to overexertion. - Advise rest, elevation, and compression.  Anxiety Chronic Managing anxiety with alprazolam , encouraged tapering to avoid dependency. Pt agrees - Continue tapering alprazolam . - Refill alprazolam  prescription as needed.  Constipation Chronic Managing with Sennosides, interested in Miralax. - Provide Miralax samples. Continue with high fiber diet  General Health Maintenance Emphasized importance of activity, hydration, and healthy lifestyle. - Encourage regular physical activity. - Advise increasing water intake to 8-12 glasses per day. - Suggest using fruit-infused water as an alternative to sugary drinks.  Follow-up - Schedule follow-up appointment in three months. - Perform blood work today and review results.      Orders Placed This Encounter  Procedures   Comprehensive metabolic panel with GFR    Has the patient fasted?:   Yes   Lipid panel    Has the patient fasted?:   Yes    No follow-ups on file.   The patient was advised to call back or seek an in-person evaluation if the symptoms worsen or if the condition fails to improve as anticipated.  I discussed the assessment and treatment plan with the patient. The patient was provided an opportunity to ask questions and all were answered. The patient agreed with the plan and demonstrated an understanding of the instructions.  I, Lonzell Dorris, PA-C have reviewed all  documentation for this visit. The documentation on 10/18/2023  for the exam, diagnosis, procedures, and orders  are all accurate and complete.  Blane Bunting, [facility], MMS [facility] [phone] (phone) [phone] (fax)  [facility]

## 2023-10-19 ENCOUNTER — Ambulatory Visit: Payer: Self-pay | Admitting: Physician Assistant

## 2023-10-19 LAB — COMPREHENSIVE METABOLIC PANEL WITH GFR
ALT: 27 IU/L (ref 0–32)
AST: 30 IU/L (ref 0–40)
Albumin: 4.5 g/dL (ref 3.9–4.9)
Alkaline Phosphatase: 195 IU/L — ABNORMAL HIGH (ref 44–121)
BUN/Creatinine Ratio: 26 (ref 12–28)
BUN: 18 mg/dL (ref 8–27)
Bilirubin Total: 0.4 mg/dL (ref 0.0–1.2)
CO2: 22 mmol/L (ref 20–29)
Calcium: 9.6 mg/dL (ref 8.7–10.3)
Chloride: 103 mmol/L (ref 96–106)
Creatinine, Ser: 0.7 mg/dL (ref 0.57–1.00)
Globulin, Total: 2.7 g/dL (ref 1.5–4.5)
Glucose: 91 mg/dL (ref 70–99)
Potassium: 4.3 mmol/L (ref 3.5–5.2)
Sodium: 141 mmol/L (ref 134–144)
Total Protein: 7.2 g/dL (ref 6.0–8.5)
eGFR: 95 mL/min/{1.73_m2} (ref >59)

## 2023-10-19 LAB — LIPID PANEL
Chol/HDL Ratio: 2.2 ratio (ref 0.0–4.4)
Cholesterol, Total: 150 mg/dL (ref 100–199)
HDL: 68 mg/dL (ref >39)
LDL Chol Calc (NIH): 67 mg/dL (ref 0–99)
Triglycerides: 76 mg/dL (ref 0–149)
VLDL Cholesterol Cal: 15 mg/dL (ref 5–40)

## 2023-10-19 NOTE — Progress Notes (Signed)
 Please check with [facility] if we could add GGT test for abnormal/elevated alkaline phosphatase

## 2023-10-19 NOTE — Progress Notes (Signed)
 Called [facility], test order has been added on.

## 2023-10-22 ENCOUNTER — Telehealth: Payer: Self-pay

## 2023-10-22 NOTE — Telephone Encounter (Unsigned)
 Copied from CRM [id number]. Topic: Clinical - Lab/Test Results >> Oct 22, 2023 10:51 AM Georgeann Kindred wrote: Reason for CRM: Patient would like a callback regarding her lab results and has questions regarding the notes left by Blane Bunting. Patient would like to be contacted back at [phone]

## 2023-10-22 NOTE — Telephone Encounter (Signed)
 Patient is scheduled to get Skyrizi  this week. She currently has some lab issues/concerns going on with PCP. Can you review most recent labs and advise if patient should hold Skyrizi  at this time?

## 2023-10-22 NOTE — Telephone Encounter (Signed)
 Patient advised of information. Will hold Skyrizi  until labs okay. aw

## 2023-10-23 LAB — SPECIMEN STATUS REPORT

## 2023-10-23 LAB — GAMMA GT: GGT: 125 IU/L — ABNORMAL HIGH (ref 0–60)
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
## 2023-10-26 ENCOUNTER — Ambulatory Visit (INDEPENDENT_AMBULATORY_CARE_PROVIDER_SITE_OTHER): Admitting: Physician Assistant

## 2023-10-26 VITALS — BP 131/67 | HR 73 | Resp 16 | Ht 61.0 in | Wt 150.0 lb

## 2023-10-26 DIAGNOSIS — B372 Candidiasis of skin and nail: Secondary | ICD-10-CM

## 2023-10-26 DIAGNOSIS — I10 Essential (primary) hypertension: Secondary | ICD-10-CM | POA: Diagnosis not present

## 2023-10-26 DIAGNOSIS — N200 Calculus of kidney: Secondary | ICD-10-CM

## 2023-10-26 DIAGNOSIS — K5902 Outlet dysfunction constipation: Secondary | ICD-10-CM

## 2023-10-26 DIAGNOSIS — E034 Atrophy of thyroid (acquired): Secondary | ICD-10-CM

## 2023-10-26 DIAGNOSIS — K644 Residual hemorrhoidal skin tags: Secondary | ICD-10-CM

## 2023-10-26 DIAGNOSIS — L409 Psoriasis, unspecified: Secondary | ICD-10-CM

## 2023-10-26 DIAGNOSIS — R768 Other specified abnormal immunological findings in serum: Secondary | ICD-10-CM

## 2023-10-26 DIAGNOSIS — F1320 Sedative, hypnotic or anxiolytic dependence, uncomplicated: Secondary | ICD-10-CM

## 2023-10-26 DIAGNOSIS — R748 Abnormal levels of other serum enzymes: Secondary | ICD-10-CM

## 2023-10-26 DIAGNOSIS — F411 Generalized anxiety disorder: Secondary | ICD-10-CM | POA: Diagnosis not present

## 2023-10-26 DIAGNOSIS — N898 Other specified noninflammatory disorders of vagina: Secondary | ICD-10-CM

## 2023-10-27 LAB — ACUTE VIRAL HEPATITIS (HAV, HBV, HCV)

## 2023-10-28 ENCOUNTER — Encounter: Payer: Self-pay | Admitting: Physician Assistant

## 2023-10-28 LAB — HCV RT-PCR, QUANT (NON-GRAPH): Hepatitis C Quantitation: NOT DETECTED [IU]/mL

## 2023-10-28 LAB — ACUTE VIRAL HEPATITIS (HAV, HBV, HCV)
Hep A IgM: NEGATIVE
Hep B C IgM: NEGATIVE
Hepatitis B Surface Ag: NEGATIVE

## 2023-10-28 LAB — HEPATITIS C ANTIBODY

## 2023-10-29 ENCOUNTER — Ambulatory Visit
Admission: RE | Admit: 2023-10-29 | Discharge: 2023-10-29 | Disposition: A | Discharge status: Home / Self Care | Source: Ambulatory Visit | Attending: Physician Assistant | Admitting: Physician Assistant

## 2023-10-29 ENCOUNTER — Ambulatory Visit: Payer: Self-pay | Admitting: Physician Assistant

## 2023-10-29 DIAGNOSIS — K8020 Calculus of gallbladder without cholecystitis without obstruction: Secondary | ICD-10-CM | POA: Diagnosis not present

## 2023-10-29 DIAGNOSIS — K828 Other specified diseases of gallbladder: Secondary | ICD-10-CM | POA: Diagnosis not present

## 2023-10-29 DIAGNOSIS — R768 Other specified abnormal immunological findings in serum: Secondary | ICD-10-CM | POA: Diagnosis not present

## 2023-10-29 DIAGNOSIS — R748 Abnormal levels of other serum enzymes: Secondary | ICD-10-CM | POA: Insufficient documentation

## 2023-11-02 ENCOUNTER — Telehealth: Payer: Self-pay

## 2023-11-02 NOTE — Telephone Encounter (Unsigned)
 Copied from CRM [id number]. Topic: General - Other >> Nov 01, 2023  4:09 PM Turkey B wrote: Reason for CRM: pt called in about confusison with appts She has one fo 07/01 for general surgery for gallstones and then t states she received a call today about her stomach for kernodle and not sure why

## 2023-11-02 NOTE — Telephone Encounter (Signed)
 Patient states that she is seeing general surgeon fr the gall stones.  Since the hepatitis panel came back ok does she still need to see GI?        Copied from CRM [id number]. Topic: Clinical - Medical Advice >> Nov 02, 2023  2:12 PM Carlatta H wrote: Reason for CRM: pt called in about confusison with appts She has one fo 07/01 for general surgery for gallstones and then t states she received a call today about her stomach for kernodle and not sure why//Please call patient

## 2023-11-05 NOTE — Telephone Encounter (Signed)
 Copied from CRM [id number]. Topic: General - Other >> Nov 05, 2023  8:32 AM Marissa P wrote: Reason for CRM: Patient called in about confusion with appts. She has one for 07/01 for general surgery for gallstones and then states she received a call Friday about her stomach for kernodle and not sure why never talked to doctor. Please call patient. Patient needs some  clarification please follow up this is the third day calling. This request was never routed over just resolved... She need to make sure she is going to the right kidney doctor.

## 2023-11-06 ENCOUNTER — Other Ambulatory Visit: Payer: Self-pay

## 2023-11-06 ENCOUNTER — Ambulatory Visit: Admitting: General Surgery

## 2023-11-06 ENCOUNTER — Encounter: Payer: Self-pay | Admitting: General Surgery

## 2023-11-06 VITALS — BP 149/81 | HR 76 | Temp 97.8°F | Ht 60.0 in | Wt 148.6 lb

## 2023-11-06 DIAGNOSIS — K8020 Calculus of gallbladder without cholecystitis without obstruction: Secondary | ICD-10-CM

## 2023-11-06 DIAGNOSIS — N200 Calculus of kidney: Secondary | ICD-10-CM

## 2023-11-06 NOTE — Patient Instructions (Signed)
Cholelithiasis  Cholelithiasis happens when gallstones form in the gallbladder. The gallbladder stores bile. Bile is a fluid that helps digest fats. Bile can harden and form into gallstones. If they cause a blockage, they can cause pain (gallbladder attack). What are the causes? This condition may be caused by: Too much bilirubin in the bile. This happens if you have sickle cell anemia. Too much of a fat-like substance (cholesterol) in your bile. Not enough bile salts in your bile. These salts help the body absorb and digest fats. The gallbladder not emptying fully or often enough. This is common in pregnant women. What increases the risk? The following factors may make you more likely to develop this condition: Being older than age 3. Eating a lot of fried foods, fat, and refined carbs (refined carbohydrates). Being female. Being pregnant many times. Using medicines with female hormones in them for a long time. Losing weight fast. Having gallstones in your family. Having health problems, such as diabetes, obesity, Crohn's disease, or liver disease. What are the signs or symptoms? Often, there may be gallstones but no symptoms. These gallstones are called silent gallstones. If a gallstone causes a blockage, you may get sudden pain. The pain: Can be in the upper right part of your belly (abdomen). Normally comes at night or after you eat. Can last an hour or more. Can spread to your right shoulder, back, or chest. Can feel like discomfort, burning, or fullness in the upper part of your belly (indigestion). If the blockage lasts more than a few hours, you can get an infection or swelling. You may: Vomit or feel like you may vomit (nauseous). Feel bloated. Have belly pain for 5 hours or more. Feel tender in your belly, often in the upper right part and under your ribs. Have a fever or chills. Have skin or the white parts of your eyes turn yellow (jaundice). Have dark pee (urine) or  pale poop (stool). How is this treated? Treatment for this condition depends on how bad you feel. If you have symptoms, you may need: Home care, if symptoms are not very bad. Do not eat for 12-24 hours. Drink only water and clear liquids. After 1 or 2 days, start to eat simple or clear foods. Try broth and crackers. You may need medicines for pain or stomach upset or both. If you have an infection, you will need antibiotics. A hospital stay, if you have very bad pain or a very bad infection. Surgery to remove your gallbladder. You may need this if: Gallstones keep coming back. You have very bad symptoms. Medicines to break up gallstones. Medicines may be used for 6-12 months. A procedure to find and take out gallstones or to break up gallstones. Follow these instructions at home: Medicines Take over-the-counter and prescription medicines only as told by your doctor. If you were prescribed antibiotics, take them as told by your doctor. Do not stop taking them even if you start to feel better. Ask your doctor if you should avoid driving or using machines while you are taking your medicine. Eating and drinking Drink enough fluid to keep your pee pale yellow. Drink water or clear fluids. This is important when you have pain. Eat healthy foods. Choose: Fewer fatty foods, such as fried foods. Fewer refined carbs. Avoid breads and grains that are highly processed, such as white bread and white rice. Choose whole grains, such as whole-wheat bread and brown rice. More fiber. Almonds, fresh fruit, and beans are healthy sources. General  instructions Keep a healthy weight. Keep all follow-up visits. You may need to see a specialist or a Careers adviser. Where to find more information [employer] Diabetes and Digestive [employer] Kidney Diseases: [email or web address] Contact a doctor if: You have sudden pain in the upper right part of your belly. Pain might spread to your right shoulder, back, or chest. Your  pain lasts more than 2 hours. You have been diagnosed with gallstones that have no symptoms and you get: Belly pain. Discomfort, burning, or fullness in the upper part of your abdomen. You keep feeling like you may vomit. You have dark pee or pale poop. Get help right away if: You have pain in your abdomen, that: Lasts more than 5 hours. Keeps getting worse. You have a fever or chills. You can't stop vomiting. Your skin or the white parts of your eyes turn yellow. This information is not intended to replace advice given to you by your health care provider. Make sure you discuss any questions you have with your health care provider. Document Revised: 02/06/2022 Document Reviewed: 02/06/2022 Elsevier Patient Education  2024 [employer].
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
## 2023-11-07 ENCOUNTER — Other Ambulatory Visit: Payer: Self-pay

## 2023-11-07 DIAGNOSIS — K8020 Calculus of gallbladder without cholecystitis without obstruction: Secondary | ICD-10-CM

## 2023-11-07 DIAGNOSIS — N200 Calculus of kidney: Secondary | ICD-10-CM

## 2023-11-07 NOTE — Telephone Encounter (Signed)
 Gallstones visible on imaging with abnormal alkaline phosphatase and GGT still need to have a thorough evaluation to rule out other complications like cholangitis or choledocholithiasis

## 2023-11-08 NOTE — Telephone Encounter (Signed)
 Copied from CRM [id number]. Topic: General - Other >> Nov 07, 2023  2:49 PM Santiya F wrote: Reason for CRM: Patient is calling in returning a call from the office. Please follow up with patient.

## 2023-11-08 NOTE — Telephone Encounter (Signed)
 Patient had a question about why she needs to go to GI. Questions answered and patient will proceed with the GI doctor referral for elevated GGT

## 2023-11-14 NOTE — Telephone Encounter (Signed)
 Copied from CRM [id number]. Topic: Referral - Question >> Nov 14, 2023  1:46 PM Tobias L wrote: Reason for CRM: Patient requesting clarification on referrals. Patient would like to discuss with provider Jolynn Spencer, PA-C or Janna's nurse.  Please reach out to patient to discuss, [phone]  Called patient, states she is confused about referrals that were placed for her. She saw [facility] for gallstones found on liver/ gb US . Patient says provider there explained to her that unless she was in any pain or had inflammation of the gallbladder they would not remove anything. Patient wants to know if and why Janna referred her to GI. Also if Janna still wants patient to see GI for gallstones even though surgical office was not concerned with them? Please advise

## 2023-11-26 ENCOUNTER — Telehealth: Payer: Self-pay

## 2023-11-26 NOTE — Telephone Encounter (Signed)
 Patient has been holding Skyrizi  injections due to abnormal lab work. She had repeat test done in mid June. She would like to know if/when she can schedule for Skyrizi  injection. Labs in [place]. Please advise.

## 2023-11-27 ENCOUNTER — Telehealth: Payer: Self-pay

## 2023-11-27 NOTE — Telephone Encounter (Signed)
 Copied from CRM [id number]. Topic: General - Other >> Nov 26, 2023  2:13 PM Charlet HERO wrote: Reason for CRM: patient is calling to speak with Janna ostwalt would like a call back she has questions about some of her referrals.

## 2023-11-28 ENCOUNTER — Ambulatory Visit (INDEPENDENT_AMBULATORY_CARE_PROVIDER_SITE_OTHER)

## 2023-11-28 DIAGNOSIS — L400 Psoriasis vulgaris: Secondary | ICD-10-CM

## 2023-11-28 MED ORDER — RISANKIZUMAB-RZAA 150 MG/ML ~~LOC~~ SOAJ
150.0000 mg | Freq: Once | SUBCUTANEOUS | Status: AC
  Administered 2023-11-28: 150 mg via SUBCUTANEOUS

## 2023-11-28 NOTE — Progress Notes (Signed)
 Patient here today to start Skyrizi  for psoriasis vulgaris.   Skyrizi  150mg /mL pen injected into left upper thigh. Patient tolerated well.   LOT: [id number] EXP: 12/2024  Alan Pizza, RMA

## 2023-12-03 ENCOUNTER — Encounter: Payer: Self-pay | Admitting: Physician Assistant

## 2023-12-04 ENCOUNTER — Encounter: Payer: Self-pay | Admitting: Physician Assistant

## 2023-12-04 ENCOUNTER — Ambulatory Visit (INDEPENDENT_AMBULATORY_CARE_PROVIDER_SITE_OTHER): Admitting: Physician Assistant

## 2023-12-04 VITALS — BP 119/67 | HR 70 | Resp 14 | Ht 60.0 in | Wt 151.1 lb

## 2023-12-04 DIAGNOSIS — K8020 Calculus of gallbladder without cholecystitis without obstruction: Secondary | ICD-10-CM | POA: Diagnosis not present

## 2023-12-04 DIAGNOSIS — F1320 Sedative, hypnotic or anxiolytic dependence, uncomplicated: Secondary | ICD-10-CM

## 2023-12-04 DIAGNOSIS — N200 Calculus of kidney: Secondary | ICD-10-CM

## 2023-12-04 DIAGNOSIS — L409 Psoriasis, unspecified: Secondary | ICD-10-CM | POA: Diagnosis not present

## 2023-12-04 DIAGNOSIS — F411 Generalized anxiety disorder: Secondary | ICD-10-CM

## 2023-12-04 DIAGNOSIS — E034 Atrophy of thyroid (acquired): Secondary | ICD-10-CM

## 2023-12-04 DIAGNOSIS — E7849 Other hyperlipidemia: Secondary | ICD-10-CM

## 2023-12-04 DIAGNOSIS — I10 Essential (primary) hypertension: Secondary | ICD-10-CM | POA: Diagnosis not present

## 2023-12-04 DIAGNOSIS — R768 Other specified abnormal immunological findings in serum: Secondary | ICD-10-CM

## 2023-12-04 MED ORDER — HYDROXYZINE HCL 10 MG PO TABS: 10.0000 mg | ORAL_TABLET | Freq: Three times a day (TID) | ORAL | 0 refills | Status: AC | PRN

## 2023-12-04 MED ORDER — ALPRAZOLAM 0.25 MG PO TABS: 0.2500 mg | ORAL_TABLET | Freq: Two times a day (BID) | ORAL | 1 refills | Status: DC | PRN

## 2023-12-04 NOTE — Progress Notes (Unsigned)
 Established patient visit  Patient: Gina [name]   DOB: 08/31/1955   68 y.o. Female  MRN: [id number] Visit Date: 12/04/2023  Today's healthcare provider: Jolynn Spencer, PA-C   Chief Complaint  Patient presents with   Hypertension   Hypothyroidism   Hyperlipidemia   Subjective       Discussed the use of AI scribe software for clinical note transcription with the patient, who gave verbal consent to proceed.  History of Present Illness Gina [name] is a 68 year old female who presents for evaluation of elevated liver enzymes.  She has a history of gallstones and hepatitis C antibodies, indicating past exposure to the virus, but is currently asymptomatic. She has elevated liver enzymes, specifically alkaline phosphatase and GGT, without recent symptoms related to her gallstones. She is taking Skyrizi , a biologic medication, and has consulted with a healthcare provider to ensure it is safe given her liver concerns. She is also taking a fiber supplement to aid bowel movements and maintains adequate water intake. She has anxiety and is tapering off Xanax  due to concerns about long-term effects, including cognitive impacts. She is considering hydroxyzine  as an alternative for managing anxiety and sleep issues.       12/04/2023   10:51 AM 10/18/2023   10:55 AM 08/20/2023    1:15 PM  Depression screen PHQ 2/9  Decreased Interest 0 0 0  Down, Depressed, Hopeless 0 0 0  PHQ - 2 Score 0 0 0  Altered sleeping 0 0 0  Tired, decreased energy 0 0 0  Change in appetite 0 0 0  Feeling bad or failure about yourself  0 0 0  Trouble concentrating 0 0 0  Moving slowly or fidgety/restless 0 0 0  Suicidal thoughts 0 0 0  PHQ-9 Score 0 0 0  Difficult doing work/chores  Not difficult at all Not difficult at all      12/04/2023   10:51 AM 10/18/2023   10:55 AM 08/20/2023    1:16 PM 07/31/2023   10:36 AM  GAD 7 : Generalized Anxiety Score  Nervous, Anxious, on Edge 0 0 0 0  Control/stop  worrying 0 0 0 0  Worry too much - different things 0 0 0 0  Trouble relaxing 0 0 0 0  Restless 0 0 0 0  Easily annoyed or irritable 0 0 0 0  Afraid - awful might happen 0 0 0 0  Total GAD 7 Score 0 0 0 0  Anxiety Difficulty  Not difficult at all Not difficult at all Not difficult at all    Medications: Outpatient Medications Prior to Visit  Medication Sig   amLODipine  (NORVASC ) 2.5 MG tablet Take 3 tablets (7.5 mg total) by mouth daily.   atorvastatin  (LIPITOR) 40 MG tablet Take 1 tablet (40 mg total) by mouth daily.   celecoxib  (CELEBREX ) 100 MG capsule TAKE 1 CAPSULE BY MOUTH TWICE DAILY( EVERY TWELVE HOURS)   Cholecalciferol (VITAMIN D3) 1000 UNITS CAPS Take 1,000 Units by mouth at bedtime.    fluticasone  (FLONASE ) 50 MCG/ACT nasal spray Place 2 sprays into both nostrils daily as needed for rhinitis.   levocetirizine (XYZAL ) 5 MG tablet Take 1 tablet (5 mg total) by mouth every evening.   levothyroxine  (SYNTHROID ) 25 MCG tablet Take 1 tablet (25 mcg total) by mouth daily before breakfast. Labs due with CPE 08/2023   Metamucil Fiber CHEW Chew 1 each by mouth as directed.   risankizumab -rzaa (SKYRIZI  PEN) 150 MG/ML pen Inject 1 mL (  150 mg total) into the skin as directed. At weeks 0 & 4.   triamcinolone  ointment (KENALOG ) 0.1 % Apply 1 Application topically 2 (two) times daily.   [DISCONTINUED] ALPRAZolam  (XANAX ) 0.25 MG tablet Take 1 tablet (0.25 mg total) by mouth 2 (two) times daily as needed. Courtesy refill; due in for in office follow up with PCP   No facility-administered medications prior to visit.    Review of Systems All negative Except see HPI   {Insert previous labs (optional):23779} {See past labs  Heme  Chem  Endocrine  Serology  Results Review (optional):1}   Objective    BP 119/67 (BP Location: Left Arm, Patient Position: Sitting, Cuff Size: Normal)   Pulse 70   Resp 14   Ht 5' (1.524 m)   Wt 151 lb 1.6 oz (68.5 kg)   SpO2 100%   BMI 29.51 kg/m   {Insert last BP/Wt (optional):23777}{See vitals history (optional):1}   Physical Exam Vitals reviewed.  Constitutional:      General: She is not in acute distress.    Appearance: Normal appearance. She is well-developed. She is not diaphoretic.  HENT:     Head: Normocephalic and atraumatic.  Eyes:     General: No scleral icterus.    Conjunctiva/sclera: Conjunctivae normal.  Neck:     Thyroid : No thyromegaly.  Cardiovascular:     Rate and Rhythm: Normal rate and regular rhythm.     Pulses: Normal pulses.     Heart sounds: Normal heart sounds. No murmur heard. Pulmonary:     Effort: Pulmonary effort is normal. No respiratory distress.     Breath sounds: Normal breath sounds. No wheezing, rhonchi or rales.  Musculoskeletal:     Cervical back: Neck supple.     Right lower leg: No edema.     Left lower leg: No edema.  Lymphadenopathy:     Cervical: No cervical adenopathy.  Skin:    General: Skin is warm and dry.     Findings: No rash.  Neurological:     Mental Status: She is alert and oriented to person, place, and time. Mental status is at baseline.  Psychiatric:        Mood and Affect: Mood normal.        Behavior: Behavior normal.      No results found for any visits on 12/04/23.      Assessment & Plan Cholelithiasis with elevated liver enzymes and hepatitis C infection Asymptomatic cholelithiasis with elevated liver enzymes. Hepatitis C infection with unclear etiology. Concern for chronic cirrhosis or hepatitis. Further evaluation needed to rule out other causes of elevated liver enzymes. - Refer to gastroenterologist for evaluation of liver enzymes and gallstones. - Discuss potential pharmacological treatments with gastroenterologist. - Monitor for symptoms such as fever, nausea, or changes in bowel movements. - Avoid greasy foods.  Generalized anxiety disorder with long-term benzodiazepine use Chronic anxiety managed with benzodiazepines. Concerns about  cognitive effects and dementia risk. She is tapering off benzodiazepines and open to alternative anxiolytics. Discussed hydroxyzine  and Buspar as alternatives. - Prescribe hydroxyzine  10 mg for anxiety and sleep. - Continue benzodiazepine regimen with gradual tapering. - Consider Buspar trial if needed. - Schedule follow-up in two months.  Autoimmune skin condition Intermittent flare-ups possibly exacerbated by stress or anxiety. - Continue management as advised by dermatologist. - Monitor for changes or worsening.  Hypothyroidism (on levothyroxine ) Well-managed on levothyroxine . No recent changes in thyroid  function. - Continue current levothyroxine  dosage. - Recheck thyroid  function tests at  next visit.  Primary hypertension ***  Psoriasis ***  GAD (generalized anxiety disorder) *** - hydrOXYzine  (ATARAX ) 10 MG tablet; Take 1 tablet (10 mg total) by mouth 3 (three) times daily as needed.  Dispense: 30 tablet; Refill: 0 - ALPRAZolam  (XANAX ) 0.25 MG tablet; Take 1 tablet (0.25 mg total) by mouth 2 (two) times daily as needed. Courtesy refill; due in for in office follow up with PCP  Dispense: 30 tablet; Refill: 1  Abnormal hepatitis serology ***   Moderate alprazolam  dependence (HCC) ***  Other hyperlipidemia ***    No orders of the defined types were placed in this encounter.   Return in about 8 weeks (around 01/29/2024) for chronic disease f/u.   The patient was advised to call back or seek an in-person evaluation if the symptoms worsen or if the condition fails to improve as anticipated.  I discussed the assessment and treatment plan with the patient. The patient was provided an opportunity to ask questions and all were answered. The patient agreed with the plan and demonstrated an understanding of the instructions.  I, Eliyah Mcshea, PA-C have reviewed all documentation for this visit. The documentation on 12/04/2023  for the exam, diagnosis, procedures, and orders  are all accurate and complete.  Jolynn Spencer, [facility], MMS [facility] [phone] (phone) [phone] (fax)  [facility]

## 2023-12-26 ENCOUNTER — Ambulatory Visit: Admitting: Dermatology

## 2023-12-26 ENCOUNTER — Encounter: Payer: Self-pay | Admitting: Dermatology

## 2023-12-26 DIAGNOSIS — L821 Other seborrheic keratosis: Secondary | ICD-10-CM

## 2023-12-26 DIAGNOSIS — L578 Other skin changes due to chronic exposure to nonionizing radiation: Secondary | ICD-10-CM

## 2023-12-26 DIAGNOSIS — L409 Psoriasis, unspecified: Secondary | ICD-10-CM

## 2023-12-26 DIAGNOSIS — Z79899 Other long term (current) drug therapy: Secondary | ICD-10-CM

## 2023-12-26 DIAGNOSIS — Z7189 Other specified counseling: Secondary | ICD-10-CM

## 2023-12-26 DIAGNOSIS — L820 Inflamed seborrheic keratosis: Secondary | ICD-10-CM

## 2023-12-26 DIAGNOSIS — W908XXA Exposure to other nonionizing radiation, initial encounter: Secondary | ICD-10-CM | POA: Diagnosis not present

## 2023-12-26 DIAGNOSIS — L304 Erythema intertrigo: Secondary | ICD-10-CM

## 2023-12-26 DIAGNOSIS — L408 Other psoriasis: Secondary | ICD-10-CM

## 2023-12-26 DIAGNOSIS — L4050 Arthropathic psoriasis, unspecified: Secondary | ICD-10-CM

## 2023-12-26 MED ORDER — RISANKIZUMAB-RZAA 150 MG/ML ~~LOC~~ SOAJ
150.0000 mg | Freq: Once | SUBCUTANEOUS | Status: AC
  Administered 2023-12-26: 150 mg via SUBCUTANEOUS

## 2023-12-26 NOTE — Progress Notes (Signed)
 Follow-Up Visit   Subjective  Gina [name] is a 68 y.o. female who presents for the following: 4 month skyrizi  and psoriasis follow up. Patient is here for 2nd injection today. Report no side effects from first initial injection of skyrizi , reports she is doing well and has no active areas of scale Has noticed a slight improvement with joint aches. Is being seen by rheumatologist.   The patient has spots, moles and lesions to be evaluated, some may be new or changing and the patient may have concern these could be cancer.  The following portions of the chart were reviewed this encounter and updated as appropriate: medications, allergies, medical history  Review of Systems:  No other skin or systemic complaints except as noted in HPI or Assessment and Plan.  Objective  Well appearing patient in no apparent distress; mood and affect are within normal limits.  A focused examination was performed of the following areas: Legs, arms, hands, elbows   Relevant exam findings are noted in the Assessment and Plan.  left medial breast x 1 Erythematous stuck-on, waxy papule or plaque  Assessment & Plan   PSORIASIS with inverse psoriasis and intertrigo  With Psoriatic arthritis (joint aches) - Currently on Skyrizi  - started 11/28/2023  Patient history  reviewed prior labs (06/06/23 and 08/20/23), no risk factors for HIV per patient. Labs drawn for HIV antibody 07/24/2018 = Nonreactive. Hepatitis C antibody positive, but HCV RNA negative.   Patient did well on oral Otezla , but medication is not affordable for patient.  Improved at skin and joints after 1 shot Started Skyrizi  11/28/2023   Well-demarcated erythematous papules/plaques with silvery scale, guttate pink scaly papules. 6% BSA on Skyrizi   Chronic condition with duration or expected duration over one year. Currently well-controlled   Treatment Plan:  Patient given 2nd initial injection today.   NDC 9925-7899-98 Lot:  [id number] Exp 02/2025 Patient educated on how to self inject at visit. Observed patient inject Skyriz 150 mg / ml pen into right thigh today. Patient tolerated well with no adverse reaction   Continue Skyrizi  150 mg/ml - inject every 3 months   Counseling on psoriasis and coordination of care  psoriasis is a chronic non-curable, but treatable genetic/hereditary disease that may have other systemic features affecting other organ systems such as joints (Psoriatic Arthritis). It is associated with an increased risk of inflammatory bowel disease, heart disease, non-alcoholic fatty liver disease, and depression.  Treatments include light and laser treatments; topical medications; and systemic medications including oral and injectables.  SEBORRHEIC KERATOSIS - Stuck-on, waxy, tan-brown papules and/or plaques  - Benign-appearing - Discussed benign etiology and prognosis. - Observe - Call for any changes  ACTINIC DAMAGE - chronic, secondary to cumulative UV radiation exposure/sun exposure over time - diffuse scaly erythematous macules with underlying dyspigmentation - Recommend daily broad spectrum sunscreen SPF 30+ to sun-exposed areas, reapply every 2 hours as needed.  - Recommend staying in the shade or wearing long sleeves, sun glasses (UVA+UVB protection) and wide brim hats (4-inch brim around the entire circumference of the hat). - Call for new or changing lesions.  INFLAMED SEBORRHEIC KERATOSIS left medial breast x 1 Symptomatic, irritating, patient would like treated. Destruction of lesion - left medial breast x 1 Complexity: simple   Destruction method: cryotherapy   Informed consent: discussed and consent obtained   Timeout:  patient name, date of birth, surgical site, and procedure verified Lesion destroyed using liquid nitrogen: Yes   Region frozen until ice ball  extended beyond lesion: Yes   Outcome: patient tolerated procedure well with no complications   Post-procedure  details: wound care instructions given    PSORIASIS   Related Medications risankizumab -rzaa (SKYRIZI ) pen 150 mg   Return in about 3 months (around 03/27/2024) for psoriasis.  IEleanor Blush, CMA, am acting as scribe for Alm Rhyme, MD.   Documentation: I have reviewed the above documentation for accuracy and completeness, and I agree with the above.  Alm Rhyme, MD

## 2023-12-26 NOTE — Patient Instructions (Signed)

## 2023-12-27 DIAGNOSIS — R7989 Other specified abnormal findings of blood chemistry: Secondary | ICD-10-CM | POA: Diagnosis not present

## 2023-12-27 DIAGNOSIS — R748 Abnormal levels of other serum enzymes: Secondary | ICD-10-CM | POA: Diagnosis not present

## 2024-01-09 ENCOUNTER — Other Ambulatory Visit: Payer: Self-pay | Admitting: Physician Assistant

## 2024-01-09 DIAGNOSIS — Z1231 Encounter for screening mammogram for malignant neoplasm of breast: Secondary | ICD-10-CM

## 2024-01-15 ENCOUNTER — Other Ambulatory Visit: Payer: Self-pay | Admitting: Physician Assistant

## 2024-01-15 DIAGNOSIS — E034 Atrophy of thyroid (acquired): Secondary | ICD-10-CM

## 2024-01-15 DIAGNOSIS — I10 Essential (primary) hypertension: Secondary | ICD-10-CM

## 2024-01-23 ENCOUNTER — Ambulatory Visit: Admitting: Dermatology

## 2024-01-25 ENCOUNTER — Ambulatory Visit (INDEPENDENT_AMBULATORY_CARE_PROVIDER_SITE_OTHER): Admitting: Physician Assistant

## 2024-01-25 VITALS — BP 118/79 | HR 67 | Temp 98.0°F | Ht 60.0 in | Wt 149.0 lb

## 2024-01-25 DIAGNOSIS — F411 Generalized anxiety disorder: Secondary | ICD-10-CM | POA: Diagnosis not present

## 2024-01-25 DIAGNOSIS — I10 Essential (primary) hypertension: Secondary | ICD-10-CM | POA: Diagnosis not present

## 2024-01-25 DIAGNOSIS — F1320 Sedative, hypnotic or anxiolytic dependence, uncomplicated: Secondary | ICD-10-CM | POA: Diagnosis not present

## 2024-01-25 DIAGNOSIS — E034 Atrophy of thyroid (acquired): Secondary | ICD-10-CM | POA: Diagnosis not present

## 2024-01-25 MED ORDER — BUSPIRONE HCL 5 MG PO TABS: 5.0000 mg | ORAL_TABLET | Freq: Two times a day (BID) | ORAL | 1 refills | Status: DC

## 2024-01-25 NOTE — Progress Notes (Signed)
 Established patient visit  Patient: Gina [name]   DOB: [birth year]   68 y.o. Female  MRN: [id number] Visit Date: 01/25/2024  Today's healthcare provider: Jolynn Spencer, PA-C   Chief Complaint  Patient presents with   Follow up of chronic health     Patient presents to discuss findings from GI physician.  She has had history of elevated liver enzymes and gallstones on imaging.   She also reports she needs her Alprazolam  refilloed but thinks you were going to change this today.   Subjective     HPI     Follow up of chronic health     Additional comments: Patient presents to discuss findings from GI physician.  She has had history of elevated liver enzymes and gallstones on imaging.   She also reports she needs her Alprazolam  refilloed but thinks you were going to change this today.      Last edited by Desanto, Elena D, CMA on 01/25/2024  1:20 PM.       Discussed the use of AI scribe software for clinical note transcription with the patient, who gave verbal consent to proceed.  History of Present Illness Gina [name] is a 68 year old female with a history of hepatitis C and anxiety who presents for follow-up regarding her liver health and anxiety management.  She recently consulted a gastroenterologist for follow-up studies related to her liver health. Her blood work results were satisfactory, and she is scheduled for further evaluation at the end of November. She is concerned about a past interaction with a physician regarding the contraction of hepatitis C, emphasizing her cleanliness and lack of drug use.  She experiences anxiety and obsessive-compulsive tendencies, particularly at night, and uses alprazolam  for management. She is interested in trying buspirone  as an alternative treatment but is concerned about the cost and frequency of visits for medication management.  She experiences occasional swelling in her feet, which she associates with salty food intake. A  recent deep cut on her leg from an accident with a dog bowl is healing well. Her tetanus vaccination is up to date, with the last shot in March 2021.  She has experienced weight fluctuations and difficulty maintaining a diet, which she attributes to the season, and is considering returning to the gym. She is contemplating receiving a flu shot but is hesitant due to mixed opinions about vaccinations.       01/25/2024    1:22 PM 12/04/2023   10:51 AM 10/18/2023   10:55 AM  Depression screen PHQ 2/9  Decreased Interest 0 0 0  Down, Depressed, Hopeless 0 0 0  PHQ - 2 Score 0 0 0  Altered sleeping 0 0 0  Tired, decreased energy 0 0 0  Change in appetite 0 0 0  Feeling bad or failure about yourself  0 0 0  Trouble concentrating 0 0 0  Moving slowly or fidgety/restless 0 0 0  Suicidal thoughts 0 0 0  PHQ-9 Score 0 0 0  Difficult doing work/chores Not difficult at all  Not difficult at all      01/25/2024    1:22 PM 12/04/2023   10:51 AM 10/18/2023   10:55 AM 08/20/2023    1:16 PM  GAD 7 : Generalized Anxiety Score  Nervous, Anxious, on Edge 0 0 0 0  Control/stop worrying 0 0 0 0  Worry too much - different things 0 0 0 0  Trouble relaxing 0 0 0 0  Restless 0 0 0  0  Easily annoyed or irritable 0 0 0 0  Afraid - awful might happen 0 0 0 0  Total GAD 7 Score 0 0 0 0  Anxiety Difficulty Not difficult at all  Not difficult at all Not difficult at all    Medications: Outpatient Medications Prior to Visit  Medication Sig   ALPRAZolam  (XANAX ) 0.25 MG tablet Take 1 tablet (0.25 mg total) by mouth 2 (two) times daily as needed. Courtesy refill; due in for in office follow up with PCP   amLODipine  (NORVASC ) 2.5 MG tablet Take 3 tablets (7.5 mg total) by mouth daily.   atorvastatin  (LIPITOR) 40 MG tablet Take 1 tablet (40 mg total) by mouth daily.   celecoxib  (CELEBREX ) 100 MG capsule TAKE 1 CAPSULE BY MOUTH TWICE DAILY( EVERY TWELVE HOURS)   Cholecalciferol (VITAMIN D3) 1000 UNITS CAPS Take  1,000 Units by mouth at bedtime.    fluticasone  (FLONASE ) 50 MCG/ACT nasal spray Place 2 sprays into both nostrils daily as needed for rhinitis.   hydrOXYzine  (ATARAX ) 10 MG tablet Take 1 tablet (10 mg total) by mouth 3 (three) times daily as needed.   levocetirizine (XYZAL ) 5 MG tablet Take 1 tablet (5 mg total) by mouth every evening.   levothyroxine  (SYNTHROID ) 25 MCG tablet TAKE 1 TABLET(25 MCG TOTAL) BY MOUTH DAILY BEFORE BREAKFAST   Metamucil Fiber CHEW Chew 1 each by mouth as directed.   risankizumab -rzaa (SKYRIZI  PEN) 150 MG/ML pen Inject 1 mL (150 mg total) into the skin as directed. At weeks 0 & 4.   triamcinolone  ointment (KENALOG ) 0.1 % Apply 1 Application topically 2 (two) times daily.   No facility-administered medications prior to visit.    Review of Systems All negative Except see HPI       Objective    BP 118/79 (BP Location: Right Arm, Patient Position: Sitting, Cuff Size: Normal)   Pulse 67   Temp 98 F (36.7 C) (Oral)   Ht 5' (1.524 m)   Wt 149 lb (67.6 kg)   SpO2 97%   BMI 29.10 kg/m     Physical Exam Vitals reviewed.  Constitutional:      General: She is not in acute distress.    Appearance: Normal appearance. She is well-developed. She is not diaphoretic.  HENT:     Head: Normocephalic and atraumatic.  Eyes:     General: No scleral icterus.    Conjunctiva/sclera: Conjunctivae normal.  Neck:     Thyroid : No thyromegaly.  Cardiovascular:     Rate and Rhythm: Normal rate and regular rhythm.     Pulses: Normal pulses.     Heart sounds: Normal heart sounds. No murmur heard. Pulmonary:     Effort: Pulmonary effort is normal. No respiratory distress.     Breath sounds: Normal breath sounds. No wheezing, rhonchi or rales.  Musculoskeletal:     Cervical back: Neck supple.     Right lower leg: No edema.     Left lower leg: No edema.  Lymphadenopathy:     Cervical: No cervical adenopathy.  Skin:    General: Skin is warm and dry.     Findings: No  rash.  Neurological:     Mental Status: She is alert and oriented to person, place, and time. Mental status is at baseline.  Psychiatric:        Mood and Affect: Mood normal.        Behavior: Behavior normal.      No results found for any visits  on 01/25/24.      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 - Prescribed Buspar  5 mg, start with one tablet daily, increase to two if needed. - Continue alprazolam  as needed during transition to Buspar . - Monitor Buspar  response over two months. - Discuss potential referral to counseling and psychiatric services if Buspar  ineffective.  Intermittent lower extremity edema She reported intermittent swelling linked to dietary salt intake, no significant swelling observed. - Advise monitoring of lower extremity swelling and dietary salt intake.  GAD (generalized anxiety disorder) (Primary)  - busPIRone  (BUSPAR ) 5 MG tablet; Take 1 tablet (5 mg total) by mouth 2 (two) times daily.  Dispense: 60 tablet; Refill: 1  Moderate alprazolam  dependence (HCC)  - busPIRone  (BUSPAR ) 5 MG tablet; Take 1 tablet (5 mg total) by mouth 2 (two) times daily.  Dispense: 60 tablet; Refill: 1  Primary hypertension Chronic and stable Continue amlodipine  2.5 mg Continue lifestyle modifications Will follow-up  Hypothyroidism due to acquired atrophy of thyroid  Chronic and previously stable TSH from 08/20/23 was wnl Continue levothyroxine  25mcg Continue lifestyle modifications Will follow-up  No orders of the defined types were placed in this encounter.   Return in about 8 weeks (around 03/21/2024) for chronic disease f/u.   The patient was advised to call back or seek an in-person evaluation if the symptoms worsen or if the condition fails  to improve as anticipated.  I discussed the assessment and treatment plan with the patient. The patient was provided an opportunity to ask questions and all were answered. The patient agreed with the plan and demonstrated an understanding of the instructions.  I, Riyanna Crutchley, PA-C have reviewed all documentation for this visit. The documentation on 01/25/2024  for the exam, diagnosis, procedures, and orders are all accurate and complete.  Jolynn Spencer, [facility], MMS [facility] [phone] (phone) [phone] (fax)  [facility]

## 2024-01-27 ENCOUNTER — Encounter: Payer: Self-pay | Admitting: Physician Assistant

## 2024-01-30 ENCOUNTER — Other Ambulatory Visit: Payer: Self-pay | Admitting: Physician Assistant

## 2024-01-30 DIAGNOSIS — I10 Essential (primary) hypertension: Secondary | ICD-10-CM

## 2024-01-30 DIAGNOSIS — J0120 Acute ethmoidal sinusitis, unspecified: Secondary | ICD-10-CM

## 2024-02-11 ENCOUNTER — Ambulatory Visit

## 2024-02-12 ENCOUNTER — Other Ambulatory Visit: Payer: Self-pay | Admitting: Physician Assistant

## 2024-02-12 DIAGNOSIS — I10 Essential (primary) hypertension: Secondary | ICD-10-CM

## 2024-02-12 DIAGNOSIS — J0120 Acute ethmoidal sinusitis, unspecified: Secondary | ICD-10-CM

## 2024-03-04 ENCOUNTER — Ambulatory Visit
Admission: RE | Admit: 2024-03-04 | Discharge: 2024-03-04 | Disposition: A | Discharge status: Home / Self Care | Source: Ambulatory Visit | Attending: Physician Assistant | Admitting: Physician Assistant

## 2024-03-04 DIAGNOSIS — Z1231 Encounter for screening mammogram for malignant neoplasm of breast: Secondary | ICD-10-CM | POA: Diagnosis not present

## 2024-03-10 ENCOUNTER — Ambulatory Visit: Payer: Self-pay | Admitting: Physician Assistant

## 2024-03-11 DIAGNOSIS — K080 Exfoliation of teeth due to systemic causes: Secondary | ICD-10-CM | POA: Diagnosis not present

## 2024-03-13 ENCOUNTER — Other Ambulatory Visit: Payer: Self-pay | Admitting: Physician Assistant

## 2024-03-13 DIAGNOSIS — I10 Essential (primary) hypertension: Secondary | ICD-10-CM

## 2024-03-13 DIAGNOSIS — J0120 Acute ethmoidal sinusitis, unspecified: Secondary | ICD-10-CM

## 2024-03-24 DIAGNOSIS — R7989 Other specified abnormal findings of blood chemistry: Secondary | ICD-10-CM | POA: Diagnosis not present

## 2024-03-26 ENCOUNTER — Other Ambulatory Visit: Payer: Self-pay | Admitting: Podiatry

## 2024-03-26 ENCOUNTER — Other Ambulatory Visit: Payer: Self-pay | Admitting: Physician Assistant

## 2024-03-26 ENCOUNTER — Ambulatory Visit: Admitting: Dermatology

## 2024-03-26 DIAGNOSIS — W908XXA Exposure to other nonionizing radiation, initial encounter: Secondary | ICD-10-CM

## 2024-03-26 DIAGNOSIS — Z7189 Other specified counseling: Secondary | ICD-10-CM

## 2024-03-26 DIAGNOSIS — L814 Other melanin hyperpigmentation: Secondary | ICD-10-CM

## 2024-03-26 DIAGNOSIS — L578 Other skin changes due to chronic exposure to nonionizing radiation: Secondary | ICD-10-CM

## 2024-03-26 DIAGNOSIS — F1320 Sedative, hypnotic or anxiolytic dependence, uncomplicated: Secondary | ICD-10-CM

## 2024-03-26 DIAGNOSIS — L4050 Arthropathic psoriasis, unspecified: Secondary | ICD-10-CM

## 2024-03-26 DIAGNOSIS — Z79899 Other long term (current) drug therapy: Secondary | ICD-10-CM

## 2024-03-26 DIAGNOSIS — L820 Inflamed seborrheic keratosis: Secondary | ICD-10-CM

## 2024-03-26 DIAGNOSIS — L409 Psoriasis, unspecified: Secondary | ICD-10-CM

## 2024-03-26 DIAGNOSIS — L821 Other seborrheic keratosis: Secondary | ICD-10-CM | POA: Diagnosis not present

## 2024-03-26 DIAGNOSIS — F411 Generalized anxiety disorder: Secondary | ICD-10-CM

## 2024-03-26 MED ORDER — RISANKIZUMAB-RZAA 150 MG/ML ~~LOC~~ SOAJ
150.0000 mg | Freq: Once | SUBCUTANEOUS | Status: AC
  Administered 2024-03-26: 150 mg via SUBCUTANEOUS

## 2024-03-26 NOTE — Progress Notes (Unsigned)
 Follow-Up Visit   Subjective  Gina [name] is a 68 y.o. female who presents for the following: Psoriasis - Pt currently on Skyrizi  injections SQ Q12W and tolerating well with no s/e and an improvement in skin and joint pain. Pt continuing to have elevation of alkaline phosphatase levels and may need a liver bx in the near future. She is hesitant to inject Skyrizi  today due possibly needing a procedure and would like to discuss holding off on treatment today. Pt concerned about lesions on the nose and L sideburn area.  The patient has spots, moles and lesions to be evaluated, some may be new or changing and the patient may have concern these could be cancer.  The following portions of the chart were reviewed this encounter and updated as appropriate: medications, allergies, medical history  Review of Systems:  No other skin or systemic complaints except as noted in HPI or Assessment and Plan.  Objective  Well appearing patient in no apparent distress; mood and affect are within normal limits.  Areas Examined: The face, arms, back, and abdomen  Relevant exam findings are noted in the Assessment and Plan.  L sideburn area x 1 Erythematous stuck-on, waxy papule or plaque  Assessment & Plan   INFLAMED SEBORRHEIC KERATOSIS L sideburn area x 1 Symptomatic, irritating, patient would like treated.  Destruction of lesion - L sideburn area x 1 Complexity: simple   Destruction method: cryotherapy   Informed consent: discussed and consent obtained   Timeout:  patient name, date of birth, surgical site, and procedure verified Lesion destroyed using liquid nitrogen: Yes   Region frozen until ice ball extended beyond lesion: Yes   Outcome: patient tolerated procedure well with no complications   Post-procedure details: wound care instructions given    PSORIASIS  PSORIASIS with PsA Much improved on Skyrizi    Patient history  reviewed prior labs (06/06/23 and 08/20/23), no risk  factors for HIV per patient. Labs drawn for HIV antibody 07/24/2018 = Nonreactive. Hepatitis C antibody positive, but HCV RNA negative.    Patient did well on oral Otezla , but medication is not affordable for patient.   Improved at skin and joints after 1 shot Started Skyrizi  11/28/2023   Well-demarcated erythematous papules/plaques with silvery scale, guttate pink scaly papules. 2% BSA. Chronic and persistent condition with duration or expected duration over one year. Condition is symptomatic/ bothersome to patient. Not currently at goal, but improving since starting Skyrizi  in July 2025. Counseling on psoriasis and coordination of care  psoriasis is a chronic non-curable, but treatable genetic/hereditary disease that may have other systemic features affecting other organ systems such as joints (Psoriatic Arthritis). It is associated with an increased risk of inflammatory bowel disease, heart disease, non-alcoholic fatty liver disease, and depression.  Treatments include light and laser treatments; topical medications; and systemic medications including oral and injectables.  Treatment Plan: Advised patient that labs were already elevated before starting Skyrizi  and there is likely no correlation between the two.   Pt not comfortable self injecting at this time. Skyrizi  150mg /mL injected into the L ant thigh and tolerated injection well. AL, CMA  Reviewed risks of biologics including immunosuppression, infections (i.e. TB reactivation), injection site reaction, and failure to improve condition. Goal is control of skin condition, not cure.  Some older biologics such as Humira and Enbrel may slightly increase risk of malignancy and may worsen congestive heart failure.  Taltz, Cosentyx, and Bimzelx may cause inflammatory bowel disease to flare or may increase  incidence of yeast infections. Skyrizi , Tremfya, and Stelara may also slightly increase risk of infection. The use of biologics requires long  term medication management, including periodic office visits, annual TB screening test and monitoring of blood work.  Counseling on psoriasis and coordination of care  psoriasis is a chronic non-curable, but treatable genetic/hereditary disease that may have other systemic features affecting other organ systems such as joints (Psoriatic Arthritis). It is associated with an increased risk of inflammatory bowel disease, heart disease, non-alcoholic fatty liver disease, and depression.  Treatments include light and laser treatments; topical medications; and systemic medications including oral and injectables. Related Medications risankizumab -rzaa (SKYRIZI ) pen 150 mg  PSORIATIC ARTHRITIS (HCC)   COUNSELING AND COORDINATION OF CARE   MEDICATION MANAGEMENT   LONG-TERM USE OF HIGH-RISK MEDICATION   LENTIGO   SEBORRHEIC KERATOSIS   ACTINIC SKIN DAMAGE     Recent history of elevated Liver test (Alk Phos)  - occurred prior to starting Skyrizi  - reviewed CMP from the past 4 years, pt seeing PCP tomorrow. Pt has seen GI Liver specialist. Follow up with PCP and GI   LENTIGINES Exam: scattered tan macules nose Due to sun exposure Treatment Plan: Benign-appearing, observe. Recommend daily broad spectrum sunscreen SPF 30+ to sun-exposed areas, reapply every 2 hours as needed.  Call for any changes  ACTINIC DAMAGE - chronic, secondary to cumulative UV radiation exposure/sun exposure over time - diffuse scaly erythematous macules with underlying dyspigmentation - Recommend daily broad spectrum sunscreen SPF 30+ to sun-exposed areas, reapply every 2 hours as needed.  - Recommend staying in the shade or wearing long sleeves, sun glasses (UVA+UVB protection) and wide brim hats (4-inch brim around the entire circumference of the hat). - Call for new or changing lesions.  SEBORRHEIC KERATOSIS - Stuck-on, waxy, tan-brown papules and/or plaques  - Benign-appearing - Discussed benign  etiology and prognosis. - Observe - Call for any changes  Return in about 3 months (around 06/26/2024) for Skyrizi  injection and psoriasis follow up.  LILLETTE Rosina Mayans, CMA, am acting as scribe for Alm Rhyme, MD .  Documentation: I have reviewed the above documentation for accuracy and completeness, and I agree with the above.  Alm Rhyme, MD

## 2024-03-26 NOTE — Patient Instructions (Signed)

## 2024-03-27 ENCOUNTER — Encounter: Payer: Self-pay | Admitting: Dermatology

## 2024-03-28 ENCOUNTER — Encounter: Payer: Self-pay | Admitting: Physician Assistant

## 2024-03-28 ENCOUNTER — Ambulatory Visit (INDEPENDENT_AMBULATORY_CARE_PROVIDER_SITE_OTHER): Admitting: Physician Assistant

## 2024-03-28 VITALS — BP 137/71 | HR 64 | Resp 14 | Ht 60.0 in | Wt 150.5 lb

## 2024-03-28 DIAGNOSIS — F411 Generalized anxiety disorder: Secondary | ICD-10-CM | POA: Diagnosis not present

## 2024-03-28 DIAGNOSIS — F1320 Sedative, hypnotic or anxiolytic dependence, uncomplicated: Secondary | ICD-10-CM | POA: Diagnosis not present

## 2024-03-28 DIAGNOSIS — E034 Atrophy of thyroid (acquired): Secondary | ICD-10-CM | POA: Diagnosis not present

## 2024-03-28 DIAGNOSIS — K8020 Calculus of gallbladder without cholecystitis without obstruction: Secondary | ICD-10-CM

## 2024-03-28 DIAGNOSIS — I10 Essential (primary) hypertension: Secondary | ICD-10-CM | POA: Diagnosis not present

## 2024-03-28 DIAGNOSIS — R222 Localized swelling, mass and lump, trunk: Secondary | ICD-10-CM

## 2024-03-28 DIAGNOSIS — Z23 Encounter for immunization: Secondary | ICD-10-CM | POA: Diagnosis not present

## 2024-03-28 DIAGNOSIS — K219 Gastro-esophageal reflux disease without esophagitis: Secondary | ICD-10-CM

## 2024-03-28 NOTE — Progress Notes (Unsigned)
 Established patient visit  Patient: Gina [name]   DOB: 1956-01-07   68 y.o. Female  MRN: [id number] Visit Date: 03/28/2024  Today's healthcare provider: Jolynn Spencer, PA-C   Chief Complaint  Patient presents with   Medical Management of Chronic Issues   Subjective       Discussed the use of AI scribe software for clinical note transcription with the patient, who gave verbal consent to proceed.  History of Present Illness Gina [name] is a 68 year old female who presents with a concern of a possible hernia after lifting heavy water cases.  She noticed a non-painful abdominal knot after lifting heavy water cases improperly. Initial pain resolved by the next day, but the knot remains without discomfort.  She has concerns about liver health due to previously elevated liver enzymes, with a slight decrease in levels since the last test. She is worried about potential liver issues.  Occasional heartburn and changes in bowel movements are present. She uses gummies to aid bowel movements and Pepcid  for heartburn as needed.  She takes Buspar  for anxiety and depression, which is generally well-managed. She is reducing her use of alprazolam , taking smaller doses when necessary.       01/25/2024    1:22 PM 12/04/2023   10:51 AM 10/18/2023   10:55 AM  Depression screen PHQ 2/9  Decreased Interest 0 0 0  Down, Depressed, Hopeless 0 0 0  PHQ - 2 Score 0 0 0  Altered sleeping 0 0 0  Tired, decreased energy 0 0 0  Change in appetite 0 0 0  Feeling bad or failure about yourself  0 0 0  Trouble concentrating 0 0 0  Moving slowly or fidgety/restless 0 0 0  Suicidal thoughts 0 0 0  PHQ-9 Score 0  0  0   Difficult doing work/chores Not difficult at all  Not difficult at all     Data saved with a previous flowsheet row definition      01/25/2024    1:22 PM 12/04/2023   10:51 AM 10/18/2023   10:55 AM 08/20/2023    1:16 PM  GAD 7 : Generalized Anxiety Score  Nervous, Anxious, on  Edge 0 0 0 0  Control/stop worrying 0 0 0 0  Worry too much - different things 0 0 0 0  Trouble relaxing 0 0 0 0  Restless 0 0 0 0  Easily annoyed or irritable 0 0 0 0  Afraid - awful might happen 0 0 0 0  Total GAD 7 Score 0 0 0 0  Anxiety Difficulty Not difficult at all  Not difficult at all Not difficult at all    Medications: Outpatient Medications Prior to Visit  Medication Sig   ALPRAZolam  (XANAX ) 0.25 MG tablet Take 1 tablet (0.25 mg total) by mouth 2 (two) times daily as needed. Courtesy refill; due in for in office follow up with PCP   amLODipine  (NORVASC ) 2.5 MG tablet Take 3 tablets (7.5 mg total) by mouth daily.   atorvastatin  (LIPITOR) 40 MG tablet Take 1 tablet (40 mg total) by mouth daily.   busPIRone  (BUSPAR ) 5 MG tablet TAKE 1 TABLET(5 MG) BY MOUTH TWICE DAILY   celecoxib  (CELEBREX ) 100 MG capsule TAKE 1 CAPSULE BY MOUTH TWICE DAILY( EVERY TWELVE HOURS)   Cholecalciferol (VITAMIN D3) 1000 UNITS CAPS Take 1,000 Units by mouth at bedtime.    fluticasone  (FLONASE ) 50 MCG/ACT nasal spray Place 2 sprays into both nostrils daily as needed for rhinitis.  hydrOXYzine  (ATARAX ) 10 MG tablet Take 1 tablet (10 mg total) by mouth 3 (three) times daily as needed.   levocetirizine (XYZAL ) 5 MG tablet TAKE 1 TABLET(5 MG) BY MOUTH EVERY EVENING   levothyroxine  (SYNTHROID ) 25 MCG tablet TAKE 1 TABLET(25 MCG TOTAL) BY MOUTH DAILY BEFORE BREAKFAST   Metamucil Fiber CHEW Chew 1 each by mouth as directed.   risankizumab -rzaa (SKYRIZI  PEN) 150 MG/ML pen Inject 1 mL (150 mg total) into the skin as directed. At weeks 0 & 4.   triamcinolone  ointment (KENALOG ) 0.1 % Apply 1 Application topically 2 (two) times daily.   No facility-administered medications prior to visit.    Review of Systems All negative Except see HPI       Objective    BP 137/71   Pulse 64   Resp 14   Ht 5' (1.524 m)   Wt 150 lb 8 oz (68.3 kg)   SpO2 98%   BMI 29.39 kg/m     Physical Exam Vitals reviewed.   Constitutional:      General: She is not in acute distress.    Appearance: Normal appearance. She is well-developed. She is not diaphoretic.  HENT:     Head: Normocephalic and atraumatic.  Eyes:     General: No scleral icterus.    Conjunctiva/sclera: Conjunctivae normal.  Neck:     Thyroid : No thyromegaly.  Cardiovascular:     Rate and Rhythm: Normal rate and regular rhythm.     Pulses: Normal pulses.     Heart sounds: Normal heart sounds. No murmur heard. Pulmonary:     Effort: Pulmonary effort is normal. No respiratory distress.     Breath sounds: Normal breath sounds. No wheezing, rhonchi or rales.  Musculoskeletal:     Cervical back: Neck supple.     Right lower leg: No edema.     Left lower leg: No edema.  Lymphadenopathy:     Cervical: No cervical adenopathy.  Skin:    General: Skin is warm and dry.     Findings: No rash.  Neurological:     Mental Status: She is alert and oriented to person, place, and time. Mental status is at baseline.  Psychiatric:        Mood and Affect: Mood normal.        Behavior: Behavior normal.      No results found for any visits on 03/28/24.      Assessment & Plan Small abdominal wall mass/lump/Suspected abdominal wall hernia Hernia suspected due to lifting, not causing significant symptoms, no immediate surgical need. - Advised against heavy lifting. - Consider imaging if symptoms worsen.  Suspected chronic liver disease Elevated alkaline phosphatase and GGT suggest possible liver/biliary disease. Discussed potential need for liver biopsy. - Contact gastroenterologist about liver biopsy necessity. - Advised her to discuss biopsy rationale with gastroenterologist.  Gastroesophageal reflux symptoms Heartburn and dysphagia possibly related to diet and stress. - Recommended Pepcid  as needed. - Advised dietary modifications: avoid late eating, reduce portions, avoid fried foods.  Primary hypertension Chronic Slightly elevated  blood pressure, possibly stress-related. - Monitor blood pressure at home. Continue amlodipine  2.5mg  Continue atorvastatin  40mg  for cholesterol control Last ldl 59 - Reassess management after addressing health concerns. Continue lifestyle modifications Will follow-up  Generalized anxiety disorder Alprazolam  dependence Chronic and stable Anxiety managed with Buspar  5mg  BID and alprazolam , prefers no therapy. - Continue Buspar  twice daily. - encouraged to continue tapering alprazolam  0.25 mg twice daily prn. Advised a referral to collaborative care  management, patient declined for now Will revisit  Hypothyroidism/Acquired atrophy of thyroid  Chronic and stable Last Tsh from 08/20/23 wnl On thyroid  medication, no hypothyroid symptoms. - Continue current thyroid  medication/levothyroxine  . - Reassess thyroid  function at next visit.    Orders Placed This Encounter  Procedures   Flu vaccine HIGH DOSE PF(Fluzone Trivalent)    No follow-ups on file.   The patient was advised to call back or seek an in-person evaluation if the symptoms worsen or if the condition fails to improve as anticipated.  I discussed the assessment and treatment plan with the patient. The patient was provided an opportunity to ask questions and all were answered. The patient agreed with the plan and demonstrated an understanding of the instructions.  I, Emaline Karnes, PA-C have reviewed all documentation for this visit. The documentation on 03/28/2024  for the exam, diagnosis, procedures, and orders are all accurate and complete.  Jolynn Spencer, [facility], MMS [facility] [phone] (phone) [phone] (fax)  [facility]

## 2024-03-29 DIAGNOSIS — K219 Gastro-esophageal reflux disease without esophagitis: Secondary | ICD-10-CM | POA: Insufficient documentation

## 2024-03-29 DIAGNOSIS — R222 Localized swelling, mass and lump, trunk: Secondary | ICD-10-CM | POA: Insufficient documentation

## 2024-03-29 MED ORDER — ALPRAZOLAM 0.25 MG PO TABS: 0.2500 mg | ORAL_TABLET | Freq: Two times a day (BID) | ORAL | 0 refills | Status: DC | PRN

## 2024-04-10 ENCOUNTER — Other Ambulatory Visit: Payer: Self-pay | Admitting: Gastroenterology

## 2024-04-10 DIAGNOSIS — R7989 Other specified abnormal findings of blood chemistry: Secondary | ICD-10-CM

## 2024-04-29 ENCOUNTER — Telehealth: Payer: Self-pay

## 2024-04-29 NOTE — Telephone Encounter (Signed)
 Patient for US  guided Liver Biopsy on Mon 05/05/24, I called and LVM for the patient on the phone and gave pre-procedure instructions. VM made the patient aware to be here at 10a, NPO after MN prior to procedure as well as driver post procedure/recovery/discharge. Called 04/29/2024

## 2024-05-05 ENCOUNTER — Other Ambulatory Visit: Payer: Self-pay | Admitting: Gastroenterology

## 2024-05-05 ENCOUNTER — Ambulatory Visit
Admission: RE | Admit: 2024-05-05 | Discharge: 2024-05-05 | Disposition: A | Discharge status: Home / Self Care | Source: Ambulatory Visit | Attending: Gastroenterology | Admitting: Gastroenterology

## 2024-05-05 ENCOUNTER — Other Ambulatory Visit: Payer: Self-pay

## 2024-05-05 VITALS — BP 116/67 | HR 63 | Temp 98.2°F | Resp 14 | Ht 60.0 in | Wt 145.0 lb

## 2024-05-05 DIAGNOSIS — R7989 Other specified abnormal findings of blood chemistry: Secondary | ICD-10-CM | POA: Diagnosis not present

## 2024-05-05 DIAGNOSIS — I10 Essential (primary) hypertension: Secondary | ICD-10-CM | POA: Diagnosis not present

## 2024-05-05 DIAGNOSIS — Z79899 Other long term (current) drug therapy: Secondary | ICD-10-CM | POA: Diagnosis not present

## 2024-05-05 DIAGNOSIS — Z791 Long term (current) use of non-steroidal anti-inflammatories (NSAID): Secondary | ICD-10-CM | POA: Insufficient documentation

## 2024-05-05 DIAGNOSIS — R748 Abnormal levels of other serum enzymes: Secondary | ICD-10-CM | POA: Diagnosis not present

## 2024-05-05 DIAGNOSIS — Z79890 Hormone replacement therapy (postmenopausal): Secondary | ICD-10-CM | POA: Insufficient documentation

## 2024-05-05 DIAGNOSIS — B1920 Unspecified viral hepatitis C without hepatic coma: Secondary | ICD-10-CM | POA: Insufficient documentation

## 2024-05-05 DIAGNOSIS — B159 Hepatitis A without hepatic coma: Secondary | ICD-10-CM | POA: Insufficient documentation

## 2024-05-05 DIAGNOSIS — Z8619 Personal history of other infectious and parasitic diseases: Secondary | ICD-10-CM | POA: Insufficient documentation

## 2024-05-05 DIAGNOSIS — Z01818 Encounter for other preprocedural examination: Secondary | ICD-10-CM

## 2024-05-05 DIAGNOSIS — R7689 Other specified abnormal immunological findings in serum: Secondary | ICD-10-CM

## 2024-05-05 DIAGNOSIS — K759 Inflammatory liver disease, unspecified: Secondary | ICD-10-CM | POA: Diagnosis present

## 2024-05-05 LAB — CBC
HCT: 42.7 % (ref 36.0–46.0)
Hemoglobin: 14 g/dL (ref 12.0–15.0)
MCH: 30.9 pg (ref 26.0–34.0)
MCHC: 32.8 g/dL (ref 30.0–36.0)
MCV: 94.3 fL (ref 80.0–100.0)
Platelets: 271 K/uL (ref 150–400)
RBC: 4.53 MIL/uL (ref 3.87–5.11)
RDW: 13.4 % (ref 11.5–15.5)
WBC: 7.9 K/uL (ref 4.0–10.5)
nRBC: 0 % (ref 0.0–0.2)

## 2024-05-05 LAB — PROTIME-INR
INR: 0.9 (ref 0.8–1.2)
Prothrombin Time: 13 s (ref 11.4–15.2)

## 2024-05-05 MED ORDER — SODIUM CHLORIDE 0.9 % IV SOLN: INTRAVENOUS | Status: DC

## 2024-05-05 MED ORDER — LIDOCAINE HCL (PF) 1 % IJ SOLN
10.0000 mL | Freq: Once | INTRAMUSCULAR | Status: AC
  Administered 2024-05-05: 10 mL via INTRADERMAL

## 2024-05-05 MED ORDER — MIDAZOLAM HCL (PF) 2 MG/2ML IJ SOLN
INTRAMUSCULAR | Status: AC | PRN
  Administered 2024-05-05: 1 mg via INTRAVENOUS

## 2024-05-05 MED ORDER — MIDAZOLAM HCL 2 MG/2ML IJ SOLN
INTRAMUSCULAR | Status: AC
  Filled 2024-05-05: qty 2

## 2024-05-05 MED ORDER — FENTANYL CITRATE (PF) 100 MCG/2ML IJ SOLN
INTRAMUSCULAR | Status: AC | PRN
  Administered 2024-05-05 (×2): 50 ug via INTRAVENOUS

## 2024-05-05 MED ORDER — FENTANYL CITRATE (PF) 100 MCG/2ML IJ SOLN
INTRAMUSCULAR | Status: AC
  Filled 2024-05-05: qty 2

## 2024-05-05 NOTE — Progress Notes (Signed)
 Patient clinically stable post US  Liver biopsy per Dr Karalee, tolerated well. Vitals stable post procedure. Denies complaints immediately post procedure. Received Versed  1 mg along with Fentanyl  100 mcg IV for procedure. Report given to Ruthanna Kitty RN post procedure/specials/16
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
## 2024-05-05 NOTE — Procedures (Signed)
 Interventional Radiology Procedure Note  Procedure: US  guided random liver biopsy  Complications: None  Estimated Blood Loss: None  Recommendations: - Bedrest x 1 hr - DC home   Signed,  Wilkie LOIS Lent, MD

## 2024-05-05 NOTE — Discharge Instructions (Signed)
 Liver Biopsy, Care After These instructions give you information about how to care for yourself after your procedure. Your health care provider may also give you more specific instructions. If you have problems or questions, contact your health care provider. What can I expect after the procedure? After your procedure, it is common to have: Pain and soreness in the area where the biopsy was done. Bruising around the area where the biopsy was done. Sleepiness and fatigue for 1-2 days. Follow these instructions at home: Medicines Take over-the-counter and prescription medicines only as told by your health care provider. If you were prescribed an antibiotic medicine, take it as told by your health care provider. Do not stop taking the antibiotic even if you start to feel better. Do not take medicines such as aspirin and ibuprofen unless your health care provider tells you to take them. These medicines thin your blood and can increase the risk of bleeding. If you are taking prescription pain medicine, take actions to prevent or treat constipation. Your health care provider may recommend that you: Drink enough fluid to keep your urine pale yellow. Eat foods that are high in fiber, such as fresh fruits and vegetables, whole grains, and beans. Limit foods that are high in fat and processed sugars, such as fried or sweet foods. Take an over-the-counter or prescription medicine for constipation. Incision care Wash your hands with soap and water before you change your bandage (dressing). If soap and water are not available, use hand sanitizer. Change your bandage tomorrow after you shower and then remove the next day. Check your incision area every day for signs of infection. Check for: Redness, swelling, or pain. Fluid or blood. Warmth. Pus or a bad smell. You may shower tomorrow No lifting more than 5 lbs for 3 days Return to your normal activities as told by your health care provider.  Do not  drive or use heavy machinery for 24hrs or while taking prescription pain medicine. Do not play contact sports for 2 weeks after the procedure. General instructions  Do not drink alcohol in the first week after the procedure. Have someone stay with you for at least 24 hours after the procedure. It is your responsibility to obtain your test results. Ask your health care provider, or the department that is doing the test: When will my results be ready? How will I get my results? What are my treatment options? What other tests do I need? What are my next steps? Keep all follow-up visits as told by your health care provider. This is important. Contact a health care provider if: You have increased bleeding from an incision, resulting in more than a small spot of blood. You have redness, swelling, or increasing pain in any incisions. You notice a discharge or a bad smell coming from any of your incisions. You have a fever or chills. Get help right away if: You develop swelling, bloating, or pain in your abdomen. You become dizzy or faint. You develop a rash. You have nausea or you vomit. You faint, or you have shortness of breath or difficulty breathing. You develop chest pain. You have problems with your speech or vision. You have trouble with your balance or moving your arms or legs. Summary After the liver biopsy, it is common to have pain, soreness, and bruising in the area, as well as sleepiness and fatigue. Take over-the-counter and prescription medicines only as told by your health care provider. Follow instructions from your health care provider about how  to care for your incision. Check the incision area daily for signs of infection. This information is not intended to replace advice given to you by your health care provider. Make sure you discuss any questions you have with your health care provider. Document Released: 11/11/2004 Document Revised: 06/17/2018 Document Reviewed:  05/04/2017 Elsevier Patient Education  2020 [employer].

## 2024-05-12 LAB — SURGICAL PATHOLOGY

## 2024-05-14 ENCOUNTER — Ambulatory Visit (INDEPENDENT_AMBULATORY_CARE_PROVIDER_SITE_OTHER)

## 2024-05-14 ENCOUNTER — Other Ambulatory Visit: Payer: Self-pay

## 2024-05-14 VITALS — BP 114/57 | HR 61 | Temp 97.7°F | Wt 146.7 lb

## 2024-05-14 DIAGNOSIS — R310 Gross hematuria: Secondary | ICD-10-CM | POA: Diagnosis not present

## 2024-05-14 LAB — POCT URINALYSIS DIPSTICK
Bilirubin, UA: NEGATIVE
Glucose, UA: NEGATIVE
Ketones, UA: NEGATIVE
Leukocytes, UA: NEGATIVE
Nitrite, UA: NEGATIVE
Protein, UA: POSITIVE — AB
Spec Grav, UA: 1.02
Urobilinogen, UA: 0.2 U/dL
pH, UA: 6

## 2024-05-14 NOTE — Progress Notes (Signed)
 "     Acute visit   Patient: Gina [name]   DOB: 05-16-1955   69 y.o. Female  MRN: [id number] PCP: Ostwalt, Janna, PA-C   Chief Complaint  Patient presents with   urinary concern   Subjective    Discussed the use of AI scribe software for clinical note transcription with the patient, who gave verbal consent to proceed.  History of Present Illness Gina [name] is a 69 year old female who presents with blood in her urine.  She noticed a small amount of blood on the tissue when wiping, which began around May 11, 2024. It was described as a 'little tiny bit' and occurred only once or twice. There was no blood observed in the toilet bowl, but a small amount was noted in her underwear. No burning sensation during urination or discomfort in the genital area.  She underwent a liver biopsy on May 05, 2024, and rested for a few days following the procedure. There was no significant bleeding post-procedure. She has a history of hemorrhoids but does not believe the blood is from the rectal area, as she is familiar with hemorrhoid symptoms. No history of kidney issues.  No fever, chills, or unexpected weight loss, although there is a slight decrease in appetite and weight following the liver biopsy. She has not started any new medications recently and has reduced her use of Celebrex , only taking it when her foot pain becomes severe.  She reports sometimes urinating more frequently, but attributes this to drinking fluids later in the evening.  Review of systems as noted in HPI.   Objective    BP (!) 114/57   Pulse 61   Temp 97.7 F (36.5 C) (Oral)   Wt 146 lb 11.2 oz (66.5 kg)   SpO2 96%   BMI 28.65 kg/m  Physical Exam Constitutional:      Appearance: Normal appearance.  HENT:     Head: Normocephalic and atraumatic.     Mouth/Throat:     Mouth: Mucous membranes are moist.  Eyes:     Pupils: Pupils are equal, round, and reactive to light.  Pulmonary:     Effort:  Pulmonary effort is normal.  Skin:    General: Skin is warm.  Neurological:     General: No focal deficit present.     Mental Status: She is alert.     Wt Readings from Last 3 Encounters:  05/14/24 146 lb 11.2 oz (66.5 kg)  05/05/24 145 lb (65.8 kg)  03/28/24 150 lb 8 oz (68.3 kg)     Results for orders placed or performed in visit on 05/14/24  POCT Urinalysis Dipstick  Result Value Ref Range   Color, UA Yellow    Clarity, UA Clear    Glucose, UA Negative Negative   Bilirubin, UA Negative    Ketones, UA Negative    Spec Grav, UA 1.020 1.010 - 1.025   Blood, UA Moderate 2+ (A)    pH, UA 6.0 5.0 - 8.0   Protein, UA Positive (A) Negative   Urobilinogen, UA 0.2 0.2 or 1.0 E.U./dL   Nitrite, UA Negative    Leukocytes, UA Negative Negative   Appearance     Odor      Assessment & Plan     Problem List Items Addressed This Visit       Genitourinary   Gross hematuria - Primary   Relevant Orders   Urine Culture   POCT Urinalysis Dipstick (Completed)   Urine  Microscopic   Basic metabolic panel with GFR   CBC   Assessment & Plan Hematuria Intermittent hematuria with no significant pain or burning. Urine dipstick positive for blood, negative for LE, nitrites. Differential includes atypical UTI, renal disease, malignancy. Patient with recent liver biopsy, however hematuria would be an uncommon finding. No new medications post-liver biopsy. - Sent urine for microscopic analysis and culture. - Ordered blood test for BMP, CBC - Advised to report worsening symptoms. - Will communicate results via MyChart.   No orders of the defined types were placed in this encounter.    No follow-ups on file.      Isaiah DELENA Pepper, MD  [facility] [phone] (phone) [phone] (fax) "

## 2024-05-15 ENCOUNTER — Ambulatory Visit: Payer: Self-pay

## 2024-05-15 ENCOUNTER — Ambulatory Visit: Payer: Self-pay | Admitting: Gastroenterology

## 2024-05-15 DIAGNOSIS — R310 Gross hematuria: Secondary | ICD-10-CM

## 2024-05-15 LAB — CBC
Hematocrit: 42 % (ref 34.0–46.6)
Hemoglobin: 13.8 g/dL (ref 11.1–15.9)
MCH: 31.2 pg (ref 26.6–33.0)
MCHC: 32.9 g/dL (ref 31.5–35.7)
MCV: 95 fL (ref 79–97)
Platelets: 292 x10E3/uL (ref 150–450)
RBC: 4.42 x10E6/uL (ref 3.77–5.28)
RDW: 12.7 % (ref 11.7–15.4)
WBC: 5 x10E3/uL (ref 3.4–10.8)

## 2024-05-15 LAB — BASIC METABOLIC PANEL WITH GFR
BUN/Creatinine Ratio: 17 (ref 12–28)
BUN: 16 mg/dL (ref 8–27)
CO2: 23 mmol/L (ref 20–29)
Calcium: 10.2 mg/dL (ref 8.7–10.3)
Chloride: 103 mmol/L (ref 96–106)
Creatinine, Ser: 0.95 mg/dL (ref 0.57–1.00)
Glucose: 70 mg/dL (ref 70–99)
Potassium: 4.3 mmol/L (ref 3.5–5.2)
Sodium: 140 mmol/L (ref 134–144)
eGFR: 66 mL/min/1.73

## 2024-05-15 LAB — URINALYSIS, MICROSCOPIC ONLY
Bacteria, UA: NONE SEEN
Casts: NONE SEEN /LPF
WBC, UA: NONE SEEN /HPF (ref 0–5)

## 2024-05-16 LAB — URINE CULTURE

## 2024-05-16 NOTE — Telephone Encounter (Signed)
 Copied from CRM #[id number]. Topic: General - Other >> May 16, 2024 12:57 PM Zebedee SAUNDERS wrote: Reason for CRM: Pt called returning Franchot Isaiah LABOR, MD message. Pt would like a call [phone] back from doctor or nurse. Pt stated she has some questions but would not expound.

## 2024-05-16 NOTE — Progress Notes (Signed)
 Called patient back to answer her questions. Patient understands the plan of care.

## 2024-05-20 ENCOUNTER — Telehealth: Payer: Self-pay | Admitting: Physician Assistant

## 2024-05-20 NOTE — Telephone Encounter (Signed)
 Copied from CRM #[id number]. Topic: General - Call Back - No Documentation >> May 20, 2024  8:36 AM Olam RAMAN wrote: Reason for CRM: Pt wanted to let provider know about ct scan. for bleeding. Pt hasnt had anymore bleeding and doesnt need a ct unless it progresses and a cb

## 2024-05-22 ENCOUNTER — Ambulatory Visit

## 2024-05-28 ENCOUNTER — Other Ambulatory Visit: Payer: Self-pay | Admitting: Physician Assistant

## 2024-05-28 DIAGNOSIS — F411 Generalized anxiety disorder: Secondary | ICD-10-CM

## 2024-06-11 ENCOUNTER — Ambulatory Visit: Payer: Self-pay

## 2024-06-11 NOTE — Telephone Encounter (Signed)
" °  FYI Only or Action Required?: Action required by provider: request for appointment.  Patient was last seen in primary care on 05/14/2024 by Franchot Isaiah LABOR, MD.  Called Nurse Triage reporting Flank Pain.  Symptoms began a week ago.  Interventions attempted: OTC medications: biofreeze.  Symptoms are: unchanged.  Triage Disposition: See Physician Within 24 Hours  Patient/caregiver understands and will follow disposition?: Yes  Message from [facility] S sent at 06/11/2024 11:31 AM EST  Reason for Triage: lt side pain   Reason for Disposition  Numbness in a leg or foot (i.e., loss of sensation)    Denies numbness/ weakness, only pain  Answer Assessment - Initial Assessment Questions No available appts today, scheduled 06/12/24. Advised call back or ED/911 if symptoms occur/worsen: severe diff breathing, chest pain > 5 min, faint. Patient verbalized understanding.   Patient requesting appt today.  1. ONSET: When did the pain begin? (e.g., minutes, hours, days)     Week ago 2. LOCATION: Where does it hurt? (upper, mid or lower back)    Left side and mid back 3. SEVERITY: How bad is the pain?  (e.g., Scale 1-10; mild, moderate, or severe)     No pain currently; 9/10, last episode, last night, laying down went away/better 4. PATTERN: Is the pain constant? (e.g., yes, no; constant, intermittent)      Comes and goes 5. RADIATION: Does the pain shoot into your legs or somewhere else?     no 6. CAUSE:  What do you think is causing the back pain?      unsure 7. BACK OVERUSE:  Any recent lifting of heavy objects, strenuous work or exercise?     no 8. MEDICINES: What have you taken so far for the pain? (e.g., nothing, acetaminophen , NSAIDS)     biofreeze 9. NEUROLOGIC SYMPTOMS: Do you have any weakness, numbness, or problems with bowel/bladder control?     Denies weakness/numbness, problems b/b control 10. OTHER SYMPTOMS: Do you have any other symptoms? (e.g., fever,  abdomen pain, burning with urination, blood in urine)       Denies diff breathing, chest pain, faint, abd pain, painful urination, fever chills n/v  Protocols used: Back Pain-A-AH  "

## 2024-06-12 ENCOUNTER — Encounter: Payer: Self-pay | Admitting: Physician Assistant

## 2024-06-12 ENCOUNTER — Other Ambulatory Visit: Payer: Self-pay | Admitting: Physician Assistant

## 2024-06-12 ENCOUNTER — Ambulatory Visit: Admitting: Physician Assistant

## 2024-06-12 VITALS — BP 123/66 | HR 68 | Wt 147.2 lb

## 2024-06-12 DIAGNOSIS — L409 Psoriasis, unspecified: Secondary | ICD-10-CM

## 2024-06-12 DIAGNOSIS — R7689 Other specified abnormal immunological findings in serum: Secondary | ICD-10-CM

## 2024-06-12 DIAGNOSIS — E034 Atrophy of thyroid (acquired): Secondary | ICD-10-CM

## 2024-06-12 DIAGNOSIS — I10 Essential (primary) hypertension: Secondary | ICD-10-CM

## 2024-06-12 DIAGNOSIS — F1320 Sedative, hypnotic or anxiolytic dependence, uncomplicated: Secondary | ICD-10-CM

## 2024-06-12 DIAGNOSIS — F411 Generalized anxiety disorder: Secondary | ICD-10-CM

## 2024-06-12 DIAGNOSIS — Z1211 Encounter for screening for malignant neoplasm of colon: Secondary | ICD-10-CM

## 2024-06-12 DIAGNOSIS — M545 Low back pain, unspecified: Secondary | ICD-10-CM

## 2024-06-12 DIAGNOSIS — E7849 Other hyperlipidemia: Secondary | ICD-10-CM

## 2024-06-12 DIAGNOSIS — Z23 Encounter for immunization: Secondary | ICD-10-CM

## 2024-06-12 DIAGNOSIS — K219 Gastro-esophageal reflux disease without esophagitis: Secondary | ICD-10-CM

## 2024-06-12 LAB — POCT URINE DIPSTICK
Bilirubin, UA: NEGATIVE
Glucose, UA: NEGATIVE mg/dL
Ketones, POC UA: NEGATIVE mg/dL
Leukocytes, UA: NEGATIVE
Nitrite, UA: NEGATIVE
Spec Grav, UA: 1.02
Urobilinogen, UA: 0.2 U/dL
pH, UA: 6

## 2024-06-12 NOTE — Progress Notes (Unsigned)
 " Established patient visit  Patient: Gina [name]   DOB: [birth year]   69 y.o. Female  MRN: [id number] Visit Date: 06/12/2024  Today's healthcare provider: Jolynn Spencer, PA-C   Chief Complaint  Patient presents with   Back Pain    LL side of back. Started 2 weeks ago. No hematuria. Patient has been taken Celebrex . It has helped.    Subjective     HPI     Back Pain    Additional comments: LL side of back. Started 2 weeks ago. No hematuria. Patient has been taken Celebrex . It has helped.       Last edited by Rosas, Joseline E, CMA on 06/12/2024  2:13 PM.       Discussed the use of AI scribe software for clinical note transcription with the patient, who gave verbal consent to proceed.  History of Present Illness   Pt was last seen on 05/14/24     05/14/2024    1:13 PM 01/25/2024    1:22 PM 12/04/2023   10:51 AM  Depression screen PHQ 2/9  Decreased Interest 0 0 0  Down, Depressed, Hopeless 0 0 0  PHQ - 2 Score 0 0 0  Altered sleeping 0 0 0  Tired, decreased energy 0 0 0  Change in appetite 0 0 0  Feeling bad or failure about yourself  0 0 0  Trouble concentrating 0 0 0  Moving slowly or fidgety/restless 0 0 0  Suicidal thoughts 0 0 0  PHQ-9 Score 0 0  0   Difficult doing work/chores  Not difficult at all      Data saved with a previous flowsheet row definition      05/14/2024    1:13 PM 01/25/2024    1:22 PM 12/04/2023   10:51 AM 10/18/2023   10:55 AM  GAD 7 : Generalized Anxiety Score  Nervous, Anxious, on Edge 0  0  0  0   Control/stop worrying 0  0  0  0   Worry too much - different things 0  0  0  0   Trouble relaxing 0  0  0  0   Restless 0  0  0  0   Easily annoyed or irritable 0  0  0  0   Afraid - awful might happen 0  0  0  0   Total GAD 7 Score 0 0 0 0  Anxiety Difficulty  Not difficult at all  Not difficult at all     Data saved with a previous flowsheet row definition    Medications: Show/hide medication list[1]  Review of Systems All  negative Except see HPI   {Insert previous labs (optional):23779} {See past labs  Heme  Chem  Endocrine  Serology  Results Review (optional):1}   Objective    BP 123/66 (BP Location: Left Arm, Patient Position: Sitting, Cuff Size: Normal)   Pulse 68   Wt 147 lb 3.2 oz (66.8 kg)   SpO2 97%   BMI 28.75 kg/m  {Insert last BP/Wt (optional):23777}{See vitals history (optional):1}   Physical Exam   Results for orders placed or performed in visit on 06/12/24  Urine Culture   UC  Result Value Ref Range   Urine Culture, Routine WILL FOLLOW   Urine Microscopic  Result Value Ref Range   WBC, UA None seen 0 - 5 /hpf   RBC, Urine None seen 0 - 2 /hpf   Epithelial Cells (non renal) 0-10 0 -  10 /hpf   Casts None seen None seen /lpf   Crystals Present (A) N/A   Crystal Type Calcium  Oxalate N/A   Bacteria, UA None seen None seen/Few  Results for orders placed or performed in visit on 06/12/24  POCT URINE DIPSTICK  Result Value Ref Range   Color, UA light yellow (A) yellow   Clarity, UA clear clear   Glucose, UA negative negative mg/dL   Bilirubin, UA negative negative   Ketones, POC UA negative negative mg/dL   Spec Grav, UA 8.979 8.989 - 1.025   Blood, UA trace-intact (A) negative   pH, UA 6.0 5.0 - 8.0   POC PROTEIN,UA trace negative, trace   Urobilinogen, UA 0.2 0.2 or 1.0 E.U./dL   Nitrite, UA Negative Negative   Leukocytes, UA Negative Negative        Assessment and Plan Assessment & Plan   GZI liver bx pain left  Orders Placed This Encounter  Procedures   Urine Culture   Urinalysis, microscopic only   POCT URINE DIPSTICK    No follow-ups on file.   The patient was advised to call back or seek an in-person evaluation if the symptoms worsen or if the condition fails to improve as anticipated.  I discussed the assessment and treatment plan with the patient. The patient was provided an opportunity to ask questions and all were answered. The patient agreed  with the plan and demonstrated an understanding of the instructions.  I, Shyla Gayheart, PA-C have reviewed all documentation for this visit. The documentation on 06/12/2024  for the exam, diagnosis, procedures, and orders are all accurate and complete.  Jolynn Spencer, [facility], MMS [facility] [phone] (phone) [phone] (fax)  [facility]     [1]  Outpatient Medications Prior to Visit  Medication Sig   ALPRAZolam  (XANAX ) 0.25 MG tablet TAKE 1 TABLET(0.25 MG) BY MOUTH TWICE DAILY AS NEEDED   amLODipine  (NORVASC ) 2.5 MG tablet Take 3 tablets (7.5 mg total) by mouth daily.   atorvastatin  (LIPITOR) 40 MG tablet Take 1 tablet (40 mg total) by mouth daily.   celecoxib  (CELEBREX ) 100 MG capsule TAKE 1 CAPSULE BY MOUTH TWICE DAILY( EVERY TWELVE HOURS)   Cholecalciferol (VITAMIN D3) 1000 UNITS CAPS Take 1,000 Units by mouth at bedtime.    fluticasone  (FLONASE ) 50 MCG/ACT nasal spray Place 2 sprays into both nostrils daily as needed for rhinitis.   levocetirizine (XYZAL ) 5 MG tablet TAKE 1 TABLET(5 MG) BY MOUTH EVERY EVENING   levothyroxine  (SYNTHROID ) 25 MCG tablet TAKE 1 TABLET(25 MCG TOTAL) BY MOUTH DAILY BEFORE BREAKFAST   Metamucil Fiber CHEW Chew 1 each by mouth as directed.   risankizumab -rzaa (SKYRIZI  PEN) 150 MG/ML pen Inject 1 mL (150 mg total) into the skin as directed. At weeks 0 & 4.   [DISCONTINUED] busPIRone  (BUSPAR ) 5 MG tablet TAKE 1 TABLET(5 MG) BY MOUTH TWICE DAILY   hydrOXYzine  (ATARAX ) 10 MG tablet Take 1 tablet (10 mg total) by mouth 3 (three) times daily as needed. (Patient not taking: Reported on 06/12/2024)   triamcinolone  ointment (KENALOG ) 0.1 % Apply 1 Application topically 2 (two) times daily. (Patient not taking: Reported on 06/12/2024)   No facility-administered medications prior to visit.   "

## 2024-06-13 ENCOUNTER — Other Ambulatory Visit: Payer: Self-pay | Admitting: Physician Assistant

## 2024-06-13 DIAGNOSIS — F411 Generalized anxiety disorder: Secondary | ICD-10-CM

## 2024-06-13 DIAGNOSIS — F1320 Sedative, hypnotic or anxiolytic dependence, uncomplicated: Secondary | ICD-10-CM

## 2024-06-13 LAB — URINE CULTURE

## 2024-06-13 LAB — URINALYSIS, MICROSCOPIC ONLY
Bacteria, UA: NONE SEEN
Casts: NONE SEEN /LPF
RBC, Urine: NONE SEEN /HPF (ref 0–2)
WBC, UA: NONE SEEN /HPF (ref 0–5)

## 2024-06-25 ENCOUNTER — Ambulatory Visit: Admitting: Dermatology

## 2024-06-27 ENCOUNTER — Ambulatory Visit: Admitting: Physician Assistant
